# Patient Record
Sex: Female | Born: 1965 | Race: Black or African American | Hispanic: No | Smoking: Never smoker
Health system: Southern US, Community
[De-identification: ages and names within clinical notes are randomized; demographics above are authoritative.]

## PROBLEM LIST (undated history)

## (undated) DIAGNOSIS — R010 Benign and innocent cardiac murmurs: Secondary | ICD-10-CM

## (undated) DIAGNOSIS — J302 Other seasonal allergic rhinitis: Secondary | ICD-10-CM

## (undated) DIAGNOSIS — T8859XA Other complications of anesthesia, initial encounter: Secondary | ICD-10-CM

## (undated) DIAGNOSIS — J45909 Unspecified asthma, uncomplicated: Secondary | ICD-10-CM

## (undated) DIAGNOSIS — T4145XA Adverse effect of unspecified anesthetic, initial encounter: Secondary | ICD-10-CM

## (undated) DIAGNOSIS — Z8673 Personal history of transient ischemic attack (TIA), and cerebral infarction without residual deficits: Secondary | ICD-10-CM

## (undated) DIAGNOSIS — S83249A Other tear of medial meniscus, current injury, unspecified knee, initial encounter: Secondary | ICD-10-CM

## (undated) HISTORY — PX: ABDOMINAL HYSTERECTOMY: SHX81

## (undated) HISTORY — PX: TUBAL LIGATION: SHX77

---

## 1992-08-27 HISTORY — PX: KNEE ARTHROSCOPY: SHX127

## 2002-08-27 DIAGNOSIS — Z8673 Personal history of transient ischemic attack (TIA), and cerebral infarction without residual deficits: Secondary | ICD-10-CM

## 2002-08-27 HISTORY — DX: Personal history of transient ischemic attack (TIA), and cerebral infarction without residual deficits: Z86.73

## 2006-06-05 ENCOUNTER — Encounter: Admission: RE | Admit: 2006-06-05 | Discharge: 2006-06-05 | Payer: Self-pay | Admitting: Internal Medicine

## 2007-06-10 ENCOUNTER — Encounter: Admission: RE | Admit: 2007-06-10 | Discharge: 2007-06-10 | Payer: Self-pay | Admitting: Internal Medicine

## 2007-06-27 ENCOUNTER — Emergency Department (HOSPITAL_COMMUNITY): Admission: EM | Admit: 2007-06-27 | Discharge: 2007-06-27 | Payer: Self-pay | Admitting: Emergency Medicine

## 2007-09-29 ENCOUNTER — Inpatient Hospital Stay (HOSPITAL_COMMUNITY): Admission: RE | Admit: 2007-09-29 | Discharge: 2007-10-01 | Payer: Self-pay | Admitting: Obstetrics and Gynecology

## 2007-09-29 ENCOUNTER — Encounter (INDEPENDENT_AMBULATORY_CARE_PROVIDER_SITE_OTHER): Payer: Self-pay | Admitting: Obstetrics and Gynecology

## 2007-09-29 HISTORY — PX: LAPAROSCOPIC HYSTERECTOMY: SHX1926

## 2007-09-29 HISTORY — PX: CYSTOSCOPY: SHX5120

## 2008-06-10 ENCOUNTER — Encounter: Admission: RE | Admit: 2008-06-10 | Discharge: 2008-06-10 | Payer: Self-pay | Admitting: Internal Medicine

## 2009-06-15 ENCOUNTER — Encounter: Admission: RE | Admit: 2009-06-15 | Discharge: 2009-06-15 | Payer: Self-pay | Admitting: Internal Medicine

## 2010-08-08 ENCOUNTER — Emergency Department (HOSPITAL_COMMUNITY)
Admission: EM | Admit: 2010-08-08 | Discharge: 2010-08-08 | Payer: Self-pay | Source: Home / Self Care | Admitting: Family Medicine

## 2010-10-01 ENCOUNTER — Inpatient Hospital Stay (INDEPENDENT_AMBULATORY_CARE_PROVIDER_SITE_OTHER)
Admission: RE | Admit: 2010-10-01 | Discharge: 2010-10-01 | Disposition: A | Discharge status: Home / Self Care | Payer: Self-pay | Source: Ambulatory Visit | Attending: Family Medicine | Admitting: Family Medicine

## 2010-10-01 DIAGNOSIS — T148 Other injury of unspecified body region: Secondary | ICD-10-CM

## 2010-10-01 DIAGNOSIS — W57XXXA Bitten or stung by nonvenomous insect and other nonvenomous arthropods, initial encounter: Secondary | ICD-10-CM

## 2011-01-09 NOTE — Op Note (Signed)
NAMELASTACIA, SOLUM              ACCOUNT NO.:  [id number]   MEDICAL RECORD NO.:  [id number]          PATIENT TYPE:  OIB   LOCATION:  9320                          FACILITY:  [facility]   PHYSICIAN:  Naima A. Dillard, M.D. DATE OF BIRTH:  [birth year]   DATE OF PROCEDURE:  09/29/2007  DATE OF DISCHARGE:                               OPERATIVE REPORT   PREOPERATIVE DIAGNOSES:  1. Dysmenorrhea.  2. Menometrorrhagia.  3. Uterine fibroids.   POSTOPERATIVE DIAGNOSES:  1. Dysmenorrhea.  2. Menometrorrhagia.  3. Uterine fibroids.   OPERATION/PROCEDURE:  Total laparoscopic hysterectomy and cystoscopy.   SURGEON:  Naima A. Normand Sloop, M.D.   ASSISTANT:  Osborn Coho, M.D.   ANESTHESIA:  General.   FINDINGS:  Fibroid uterus with left sidewall adhesions.   SPECIMEN:  Uterus and fibroid and disposition to pathology.   ESTIMATED BLOOD LOSS:  Minimal.   COMPLICATIONS:  None.   DISPOSITION:  The patient to recovery room in stable condition.   DESCRIPTION OF PROCEDURE:  The patient was taken to the operating room  where she was placed in dorsal lithotomy position, prepped and draped in  a normal sterile fashion.  A weighted speculum placed in the posterior  portion of the vagina.  A deaver was used to elevate the vaginal mucosa  anteriorly.  The cervix was grasped with single-tooth tenaculum.  Uterus  did sound to 10 cm.  The uterus was then dilated up with Shawnie Pons dilators  up to 23.  A size 8 uterine manipulator was placed into the uterine  cavity with the RUMI without difficulty.  The occluder baloon and  catheter balloon  was then blown up and attention was then turned to the  umbilicus where a 10 mm infraumbilical incision was made with a scalpel  along her previous scar and carried down to the fascia.  The fascia was  incised and entered sharply, extended bilaterally.  The fascia was then  circumscribed with O Vicryl and a Hasson manipulator was placed into the  abdominal cavity and  intra-abdominal placement was confirmed with a lap.  The scope abdomen was insufflated with CO2 gas.  A 10 mm trocar was  placed in the left lower quadrant just outside of the inferior  epigastric artery.  The 5 mm trocars placed on the right lower quadrant  side of the right epigastric artery.  The patient's right utero-ovarian  ligament was cauterized and cut using Harmonic scalpel and the bladder  flap was created sharply with a Harmonic and digitally to the midline.  The patient left side wall adhesions which were taken down with the  Harmonic scalpel and then her left utero-ovarian ligament were  cauterized, cut, and then the left round ligament was cauterized, cut  and the bladder flap was made to the midline.  The bladder flap was then  seen, but it was then removed bluntly with Harmonic and atraumatic  grasper.  The findings upon  looking in the abdomen was normal abdominal  anatomy.  The patient had a large fibroid uterus, about 10 weeks size,  with several small exophytic fibroids and one large  posterior fibroid.  The patient had normal appearing ovary and ligated tube on the right. On  the left, there was no absent tube and scar tissue extending from the  uterus to the pelvic sidewall where the ovary used to be.  After the  bladder flap was created, the uterine artery was clamped, cut and  ligated with a Harmonic scalpel on both sides. The anterior and  posterior cul-de-sac were circumscribed using the Harmonic scalpel  around the RUMI and the uterus was too big to pull out of the vagina, so  we did do a myomectomy and morcellate the uterus using Harmonic scalpel.  Uterus was then pulled out of the vagina and the fibroid also pulled out  of the vagina.  The vaginal cuff was reapproximated using interrupted  suture of 0 PDS first on the cuff, the of the two angles, and then three  sutures in the midline.  Irrigation was done and the gas was allowed to  leave the abdomen. There  was some bleeding along the left ovary which  was made hemostatic with Kleppingers.  Irrigation was done again and the  pressure was let out and was noted to be hemostatic.  Attention was then  turned to the vagina where the Foley catheter was removed.  The patient  was given indigo carmine and we looked at the ureters which both  effluxed indigo carmine without difficulty.  Bladder had been was intact  and normal.  No suture was placed in the bladder.  The cystoscope was  removed from the bladder. I then put a speculum in the vagina.  There  was no bleeding from the angles. We then changed our gloves and went up  top.  Removed all  all instruments under direct visualization.  First we  filled the abdomen back with CO2 gas.  There was a small bleeding area  just underneath the bladder flap.  This was made hemostatic with  Kleppingers and Gelfoam.  All instruments were then removed under direct  visualization of the camera.  The umbilical incision was reapproximated,  tying the 0 Vicryl circumferential stitch.  The skin and the umbilical  stitch was closed using 3-0 Monocryl subcuticular fashion. A left lower  quadrant subcutaneous tissue and fascia we could feel were  reapproximated using O Vicryl and the two skin incisions on the left  lower quadrant and right lower quadrant were closed using Dermabond.  Foley catheter was replaced.  Sponge, lap and needle counts were  correct.  The patient to the recovery room in stable condition.      Naima A. Normand Sloop, M.D.  Electronically Signed     NAD/MEDQ  D:  09/29/2007  T:  09/30/2007  Job:  [id number]

## 2011-01-09 NOTE — H&P (Signed)
NAMETYMESHA, DITMORE              ACCOUNT NO.:  [id number]   MEDICAL RECORD NO.:  [id number]          PATIENT TYPE:  AMB   LOCATION:  SDC                           FACILITY:  [facility]   PHYSICIAN:  Naima A. Dillard, M.D. DATE OF BIRTH:  Jul 30, 1966   DATE OF ADMISSION:  DATE OF DISCHARGE:                              HISTORY & PHYSICAL   CHIEF COMPLAINTS:  1. Menometrorrhagia.  2. Dysmenorrhea.  3. Stress urinary incontinence with pelvic relaxation.  4. Symptomatic fibroids.   HISTORY AND PHYSICAL:  The patient is a 45 year old gravida 2, para 1  who presented to me in November 2008, stating that she was having  occasional spotting and vaginal bleeding in between her periods.  She  denies having any bleeding disorders, was having some urinary frequency  and incontinence.  The patient then later on in December/January had  very heavy painful periods and actually went to the emergency room for  the pain.  She had an ultrasound which showed her uterus to be 9.2 x 4.9  x 5.8-cm with multiple fibroids, the largest measuring 4.1-cm.  Ovaries  were normal.  The patient came to me.  Her TSH was normal.  She denied  being on any new medications.  Ultrasound as above.  Her hemoglobin was  11.8.  Her endometrial biopsy was benign.  She underwent urodynamics  which were significant for stress urinary incontinence and the patient  was explained all the treatment for the above.  She desires to proceed  with hysterectomy.   PAST SURGICAL HISTORY:  1. Bilateral tubal ligation.  2. Right knee surgery.  3. Laparoscopic salpingectomy secondary to an ectopic.   PAST MEDICAL HISTORY:  As above and questionable cerebrovascular  accident in 2003 cause is unknown.  The patient says one doctor said she  did not have it, it was just migraines.  I tried to get her release of  information but is unavailable.   PAST OBSTETRICAL ISSUES:  Significant for vaginal delivery x1 and  ectopic pregnancy x1.   MEDICATIONS:  Zyrtec and Motrin as needed.   FAMILY HISTORY:  Significant for maternal grandmother with ovarian  cancer, hypertension, and diabetes.   The patient denies any smoking.  She has occasional alcohol use and no  illicit drug use.   ALLERGIES:  No known drug allergies.   PHYSICAL EXAMINATION:  VITAL SIGNS:  The patient's blood pressure is  108/50, weight is 142 pounds.  She is 5 feet 8 inches.  HEENT:  Pupils are equal.  Hearing is normal.  Throat is clear.  Thyroid  is not enlarged.  HEART:  Regular rate and rhythm.  LUNGS:  Clear to auscultation bilaterally.  ABDOMEN:  Soft and nontender.  There is no organomegaly.  BACK:  Has no CVA tenderness bilaterally.  EXTREMITIES:  No cyanosis, clubbing, or edema.  NEUROLOGIC:  Within normal limits.  PELVIC:  Full vaginal exam is within normal limits.  Cervix is nontender  without any lesions.  The uterus is about 8 weeks' size, mobile, and  nontender. Adnexa have no masses.   ASSESSMENT:  1. Symptomatic fibroids.  2. Menometrorrhagia.  3. Dysmenorrhea.  4. Stress urinary incontinence.   All treatments were given to the patient for menometrorrhagia, fibroids,  stress urinary incontinence, and pelvic relaxation.  These are, but not  limited to, observation, pessary or medication, D&C hysteroscopy, D&C  hysteroscopy ablation, myomectomy, and hysterectomy.  All were reviewed  with the patient.  The patient has decided to proceed with total  laparoscopic hysterectomy.  However, she wishes to do Kegel exercises  for her stress urinary incontinence.  First she stated she wanted to  have an anterior posterior repair mesh but after doing some research  about slings, the anterior posterior repair she wants to hold on this  and just do Kegel exercises.   PLAN:  Proceed with total laparoscopic hysterectomy.  She understands  the risks are, but not limited to, bleeding, infection, damage to  internal organs such as bowel, bladder,  and major blood vessels.  The  patient was given a brochure on total laparoscopic hysterectomy and an  ACOG brochure on hysterectomy.  She was also given some information on  how to do Kegel exercises.      Naima A. Normand Sloop, M.D.  Electronically Signed     NAD/MEDQ  D:  09/28/2007  T:  09/28/2007  Job:  [id number]

## 2011-01-09 NOTE — Discharge Summary (Signed)
[name], [name]              ACCOUNT NO.:  [id number]   MEDICAL RECORD NO.:  [id number]          PATIENT TYPE:  INP   LOCATION:  9320                          FACILITY:  [facility]   PHYSICIAN:  [name], M.D. DATE OF BIRTH:  1966-08-17   DATE OF ADMISSION:  09/29/2007  DATE OF DISCHARGE:  10/01/2007                               DISCHARGE SUMMARY   DISPOSITION:  The patient is to go home.   DIET:  No restrictions.   ACTIVITY:  No driving for 2 weeks. No heavy lifting for 6 weeks. No  sexual activity for 6 weeks.   DISCHARGE MEDICATIONS:  Include Motrin, Vicodin, Reglan, Phenergan,  Ciprofloxacin, Flagyl, and Colace.   FOLLOWUP:  The patient is to followup with me, [name], on October 10, 2007 at 10:00 a.m. She also is given written postoperative  instructions from my office.   CONDITION ON DISCHARGE:  Stable.   HOSPITAL COURSE:  The patient underwent a total laparoscopic  hysterectomy with lysis on adhesions on September 29, 2007 without  difficulty. On postoperative day 1, the patient did have a temperature  as high as 101 and some nausea the first day. She was cut back to a  clear liquid diet, placed on Reglan. A UA and C&S was done and she was  placed on Ciprofloxacin and Flagyl. On postoperative day 2, her white  count actually decreased to 6.3 with no left shift. Hemoglobin 8.9 and  platelets of 289,000.   PHYSICAL EXAMINATION:  GENERAL:  Her examination is totally benign.  HEART:  Regular.  LUNGS:  Clear.  ABDOMEN:  Soft, nontender.  GENITOURINARY:  She has no bleeding from the vagina.   The patient is postoperative day 2, doing well, passing gas and  tolerating p.o. She is ready to go home. Because of her elevated  temperature, I will send her home on antibiotic Ciprofloxacin and  Flagyl. The patient is told to call if she gets a temperature over 100.  She is also discharged home with medications as above.      [name], M.D.  Electronically Signed     NAD/MEDQ  D:  10/01/2007  T:  10/02/2007  Job:  [id number]

## 2011-05-17 LAB — CBC
HCT: 33.2 — ABNORMAL LOW
Hemoglobin: 11.3 — ABNORMAL LOW
MCHC: 34.1
MCV: 87.3
Platelets: 361
RBC: 3.8 — ABNORMAL LOW
RDW: 13.9
WBC: 5.1

## 2011-05-17 LAB — BASIC METABOLIC PANEL
BUN: 9
CO2: 25
Calcium: 9.2
Chloride: 105
Creatinine, Ser: 0.74
GFR calc Af Amer: >60
GFR calc non Af Amer: >60
Glucose, Bld: 85
Potassium: 4
Sodium: 137

## 2011-05-17 LAB — HCG, QUANTITATIVE, PREGNANCY: hCG, Beta Chain, Quant, S: <2

## 2011-05-18 LAB — CBC
HCT: 25.9 — ABNORMAL LOW
HCT: 30.6 — ABNORMAL LOW
Hemoglobin: 10.4 — ABNORMAL LOW
Hemoglobin: 8.9 — ABNORMAL LOW
MCHC: 33.9
MCHC: 34.3
MCV: 87.3
MCV: 88.1
Platelets: 289
Platelets: 345
RBC: 2.97 — ABNORMAL LOW
RBC: 3.47 — ABNORMAL LOW
RDW: 13.9
RDW: 14
WBC: 6.3
WBC: 9.1

## 2011-05-18 LAB — URINALYSIS, ROUTINE W REFLEX MICROSCOPIC
Bilirubin Urine: NEGATIVE
Glucose, UA: NEGATIVE
Ketones, ur: NEGATIVE
Nitrite: NEGATIVE
Protein, ur: NEGATIVE
Specific Gravity, Urine: <1.005 — ABNORMAL LOW
Urobilinogen, UA: 0.2
pH: 7

## 2011-05-18 LAB — URINE MICROSCOPIC-ADD ON

## 2011-05-18 LAB — URINE CULTURE
Colony Count: NO GROWTH
Culture: NO GROWTH
Special Requests: NEGATIVE

## 2011-05-18 LAB — BASIC METABOLIC PANEL
BUN: 3 — ABNORMAL LOW
CO2: 27
Calcium: 8.2 — ABNORMAL LOW
Chloride: 105
Creatinine, Ser: 0.73
GFR calc Af Amer: >60
GFR calc non Af Amer: >60
Glucose, Bld: 125 — ABNORMAL HIGH
Potassium: 4.2
Sodium: 137

## 2011-05-18 LAB — DIFFERENTIAL
Basophils Absolute: 0
Basophils Relative: 0
Eosinophils Absolute: 0.1
Eosinophils Relative: 1
Lymphocytes Relative: 25
Lymphs Abs: 1.6
Monocytes Absolute: 0.5
Monocytes Relative: 7
Neutro Abs: 4.2
Neutrophils Relative %: 66

## 2011-06-06 LAB — DIFFERENTIAL
Basophils Absolute: 0
Basophils Relative: 0
Eosinophils Absolute: 0
Eosinophils Relative: 1
Lymphocytes Relative: 22
Lymphs Abs: 1.4
Monocytes Absolute: 0.3
Monocytes Relative: 5
Neutro Abs: 4.6
Neutrophils Relative %: 72

## 2011-06-06 LAB — URINE MICROSCOPIC-ADD ON

## 2011-06-06 LAB — COMPREHENSIVE METABOLIC PANEL
ALT: 15
AST: 17
Albumin: 3.5
Alkaline Phosphatase: 54
BUN: 6
CO2: 27
Calcium: 9
Chloride: 106
Creatinine, Ser: 0.77
GFR calc Af Amer: >60
GFR calc non Af Amer: >60
Glucose, Bld: 95
Potassium: 3.6
Sodium: 139
Total Bilirubin: 0.6
Total Protein: 7

## 2011-06-06 LAB — CBC
HCT: 31.2 — ABNORMAL LOW
Hemoglobin: 10.5 — ABNORMAL LOW
MCHC: 33.6
MCV: 88.4
Platelets: 388
RBC: 3.52 — ABNORMAL LOW
RDW: 13.6
WBC: 6.4

## 2011-06-06 LAB — GC/CHLAMYDIA PROBE AMP, GENITAL
Chlamydia, DNA Probe: NEGATIVE
GC Probe Amp, Genital: NEGATIVE

## 2011-06-06 LAB — URINALYSIS, ROUTINE W REFLEX MICROSCOPIC
Bilirubin Urine: NEGATIVE
Glucose, UA: NEGATIVE
Ketones, ur: NEGATIVE
Leukocytes, UA: NEGATIVE
Nitrite: NEGATIVE
Protein, ur: NEGATIVE
Specific Gravity, Urine: 1.021
Urobilinogen, UA: 0.2
pH: 6.5

## 2011-06-06 LAB — WET PREP, GENITAL
Clue Cells Wet Prep HPF POC: NONE SEEN
Trich, Wet Prep: NONE SEEN
WBC, Wet Prep HPF POC: NONE SEEN
Yeast Wet Prep HPF POC: NONE SEEN

## 2011-06-06 LAB — PREGNANCY, URINE: Preg Test, Ur: NEGATIVE

## 2011-06-06 LAB — RPR: RPR Ser Ql: NONREACTIVE

## 2012-02-19 ENCOUNTER — Encounter (HOSPITAL_COMMUNITY): Payer: Self-pay | Admitting: Emergency Medicine

## 2012-02-19 ENCOUNTER — Emergency Department (INDEPENDENT_AMBULATORY_CARE_PROVIDER_SITE_OTHER): Payer: Self-pay

## 2012-02-19 ENCOUNTER — Emergency Department (INDEPENDENT_AMBULATORY_CARE_PROVIDER_SITE_OTHER)
Admission: EM | Admit: 2012-02-19 | Discharge: 2012-02-19 | Disposition: A | Discharge status: Home / Self Care | Payer: Self-pay | Source: Home / Self Care | Attending: Emergency Medicine | Admitting: Emergency Medicine

## 2012-02-19 DIAGNOSIS — S66819A Strain of other specified muscles, fascia and tendons at wrist and hand level, unspecified hand, initial encounter: Secondary | ICD-10-CM

## 2012-02-19 DIAGNOSIS — S63509A Unspecified sprain of unspecified wrist, initial encounter: Secondary | ICD-10-CM

## 2012-02-19 DIAGNOSIS — S63599A Other specified sprain of unspecified wrist, initial encounter: Secondary | ICD-10-CM

## 2012-02-19 HISTORY — DX: Other seasonal allergic rhinitis: J30.2

## 2012-02-19 MED ORDER — MELOXICAM 7.5 MG PO TABS: 7.5000 mg | ORAL_TABLET | Freq: Every day | ORAL | Status: AC

## 2012-02-19 NOTE — ED Notes (Signed)
Patient not in treatment room, patient in xray

## 2012-02-19 NOTE — ED Provider Notes (Signed)
History     CSN: [id number]  Arrival date & time 02/19/12  1419   First MD Initiated Contact with Patient 02/19/12 1503      Chief Complaint  Patient presents with  . Wrist Pain    (Consider location/radiation/quality/duration/timing/severity/associated sxs/prior treatment) HPI Comments: Patient presents urgent care today complaining of left wrist pain she fell down 2 steps, landing in buttocks and her  left  wrist trying to avoid  fall. Is able to move her fingers but is tender mostly in the ulnar side of her wrist. Patient denies any numbness or weakness.  Patient is a 46 y.o. female presenting with wrist pain. The history is provided by the patient.  Wrist Pain This is a new problem. The current episode started 2 days ago. The problem occurs constantly. The problem has not changed since onset.Pertinent negatives include no chest pain, no abdominal pain, no headaches and no shortness of breath. The symptoms are aggravated by bending. The symptoms are relieved by rest. She has tried nothing for the symptoms. The treatment provided no relief.    Past Medical History  Diagnosis Date  . Seasonal allergies     Past Surgical History  Procedure Date  . Abdominal hysterectomy     History reviewed. No pertinent family history.  History  Substance Use Topics  . Smoking status: Never Smoker   . Smokeless tobacco: Not on file  . Alcohol Use: Yes    OB History    Grav Para Term Preterm Abortions TAB SAB Ect Mult Living                  Review of Systems  Constitutional: Negative for fever, chills, diaphoresis, activity change, appetite change and fatigue.  Respiratory: Negative for shortness of breath.   Cardiovascular: Negative for chest pain.  Gastrointestinal: Negative for abdominal pain.  Musculoskeletal: Positive for arthralgias. Negative for joint swelling.  Skin: Negative for rash and wound.  Neurological: Negative for weakness, numbness and headaches.     Allergies  Review of patient's allergies indicates no known allergies.  Home Medications   Current Outpatient Rx  Name Route Sig Dispense Refill  . CETIRIZINE HCL 1 MG/ML PO SYRP Oral Take by mouth daily.    Marland Kitchen VITAMIN D PO Oral Take by mouth.    . MULTIVITAMIN PO Oral Take by mouth.    . TRIAMCINOLONE ACETONIDE 55 MCG/ACT NA INHA Nasal Place 2 sprays into the nose daily.    . MELOXICAM 7.5 MG PO TABS Oral Take 1 tablet (7.5 mg total) by mouth daily. 14 tablet 0    BP 122/76  Pulse 80  Temp 98.2 F (36.8 C) (Oral)  Resp 18  SpO2 100%  Physical Exam  Nursing note and vitals reviewed. Constitutional: She appears well-developed and well-nourished. No distress.  Musculoskeletal: She exhibits tenderness. She exhibits no edema.       Right shoulder: She exhibits decreased range of motion, tenderness, bony tenderness and pain. She exhibits no swelling, no effusion, no spasm and normal strength.       Left hand: She exhibits tenderness and bony tenderness. She exhibits normal range of motion, normal two-point discrimination, normal capillary refill, no deformity, no laceration and no swelling. decreased sensation noted. Decreased sensation is present in the ulnar distribution. Decreased sensation is not present in the medial redistribution and is not present in the radial distribution. Normal strength noted.       Hands: Neurological: She exhibits normal muscle tone.  Skin: No  rash noted. No erythema.    ED Course  Procedures (including critical care time)  Labs Reviewed - No data to display Dg Wrist Complete Left  02/19/2012  *RADIOLOGY REPORT*  Clinical Data: Fall  LEFT WRIST - COMPLETE 3+ VIEW  Comparison: None.  Findings: Four views of the left wrist submitted.  No acute fracture or subluxation.  No radiopaque foreign body.  IMPRESSION: No acute fracture or subluxation.  Original Report Authenticated By: Natasha Mead, M.D.     1. Wrist sprain and strain       MDM  Left  wrist contusion and sprain. Negative x-rays. No neural muscular or vascular deficits. Patient was encouraged to followup with an orthopedic Dr. if pain persists was provided with a Velcro splint to be used for comfort for 3-5 days.        Jimmie Molly, MD 02/19/12 2013

## 2012-02-19 NOTE — Discharge Instructions (Signed)
If pain persists beyond 7-10 days followup with an orthopedic Dr. Has discuss use provided splint for comfort only.

## 2012-02-19 NOTE — ED Notes (Signed)
Slipped on stairs, fell down 2 steps, landing buttocks.  Left wrist pain noticed this am.  Fall on steps occurred last night left radial pulse 2 plus, able to move fingers, but feels significant pain and soreness in left hand and wrist with movement.  Brisk cap refil

## 2012-10-14 ENCOUNTER — Other Ambulatory Visit: Payer: Self-pay | Admitting: Internal Medicine

## 2012-10-14 DIAGNOSIS — Z1231 Encounter for screening mammogram for malignant neoplasm of breast: Secondary | ICD-10-CM

## 2012-10-21 ENCOUNTER — Ambulatory Visit
Admission: RE | Admit: 2012-10-21 | Discharge: 2012-10-21 | Disposition: A | Discharge status: Home / Self Care | Payer: BC Managed Care – PPO | Source: Ambulatory Visit | Attending: Internal Medicine | Admitting: Internal Medicine

## 2012-10-21 DIAGNOSIS — Z1231 Encounter for screening mammogram for malignant neoplasm of breast: Secondary | ICD-10-CM

## 2013-10-01 ENCOUNTER — Other Ambulatory Visit: Payer: Self-pay

## 2013-10-01 DIAGNOSIS — Z1231 Encounter for screening mammogram for malignant neoplasm of breast: Secondary | ICD-10-CM

## 2013-10-27 ENCOUNTER — Ambulatory Visit: Payer: BC Managed Care – PPO

## 2013-10-29 ENCOUNTER — Encounter (INDEPENDENT_AMBULATORY_CARE_PROVIDER_SITE_OTHER): Payer: BC Managed Care – PPO | Admitting: Ophthalmology

## 2013-10-29 DIAGNOSIS — H33309 Unspecified retinal break, unspecified eye: Secondary | ICD-10-CM

## 2013-10-29 DIAGNOSIS — H43819 Vitreous degeneration, unspecified eye: Secondary | ICD-10-CM

## 2013-10-29 DIAGNOSIS — H35419 Lattice degeneration of retina, unspecified eye: Secondary | ICD-10-CM

## 2013-11-02 ENCOUNTER — Ambulatory Visit
Admission: RE | Admit: 2013-11-02 | Discharge: 2013-11-02 | Disposition: A | Discharge status: Home / Self Care | Payer: Self-pay | Source: Ambulatory Visit

## 2013-11-02 DIAGNOSIS — Z1231 Encounter for screening mammogram for malignant neoplasm of breast: Secondary | ICD-10-CM

## 2013-11-05 ENCOUNTER — Encounter (INDEPENDENT_AMBULATORY_CARE_PROVIDER_SITE_OTHER): Payer: Self-pay | Admitting: Ophthalmology

## 2013-11-09 ENCOUNTER — Encounter (INDEPENDENT_AMBULATORY_CARE_PROVIDER_SITE_OTHER): Payer: BC Managed Care – PPO | Admitting: Ophthalmology

## 2013-11-09 DIAGNOSIS — H33309 Unspecified retinal break, unspecified eye: Secondary | ICD-10-CM

## 2013-11-12 ENCOUNTER — Ambulatory Visit: Payer: BC Managed Care – PPO

## 2013-11-16 ENCOUNTER — Ambulatory Visit (INDEPENDENT_AMBULATORY_CARE_PROVIDER_SITE_OTHER): Payer: BC Managed Care – PPO | Admitting: Ophthalmology

## 2013-11-16 DIAGNOSIS — H33309 Unspecified retinal break, unspecified eye: Secondary | ICD-10-CM

## 2014-04-08 ENCOUNTER — Ambulatory Visit (INDEPENDENT_AMBULATORY_CARE_PROVIDER_SITE_OTHER): Payer: BC Managed Care – PPO | Admitting: Ophthalmology

## 2014-04-08 DIAGNOSIS — H5210 Myopia, unspecified eye: Secondary | ICD-10-CM

## 2014-04-08 DIAGNOSIS — H33309 Unspecified retinal break, unspecified eye: Secondary | ICD-10-CM

## 2014-04-08 DIAGNOSIS — H43819 Vitreous degeneration, unspecified eye: Secondary | ICD-10-CM

## 2014-09-27 ENCOUNTER — Other Ambulatory Visit: Payer: Self-pay

## 2014-09-27 DIAGNOSIS — Z1231 Encounter for screening mammogram for malignant neoplasm of breast: Secondary | ICD-10-CM

## 2014-10-07 ENCOUNTER — Ambulatory Visit (INDEPENDENT_AMBULATORY_CARE_PROVIDER_SITE_OTHER): Payer: BC Managed Care – PPO | Admitting: Podiatry

## 2014-10-07 ENCOUNTER — Ambulatory Visit (INDEPENDENT_AMBULATORY_CARE_PROVIDER_SITE_OTHER): Payer: BC Managed Care – PPO

## 2014-10-07 ENCOUNTER — Encounter: Payer: Self-pay | Admitting: Podiatry

## 2014-10-07 VITALS — BP 113/67 | HR 85 | Resp 16

## 2014-10-07 DIAGNOSIS — M722 Plantar fascial fibromatosis: Secondary | ICD-10-CM

## 2014-10-07 MED ORDER — MELOXICAM 15 MG PO TABS: 15.0000 mg | ORAL_TABLET | Freq: Every day | ORAL | Status: DC

## 2014-10-07 MED ORDER — METHYLPREDNISOLONE (PAK) 4 MG PO TABS: ORAL_TABLET | ORAL | Status: DC

## 2014-10-07 NOTE — Progress Notes (Signed)
   Subjective:    Patient ID: [name], female    DOB: [birth year], 49 y.o.   MRN: [id number]  HPI Comments: "I have been having a lot of pain"  Patient c/o aching plantar heels bilateral for about 1 year. Pain is starting to run into arch. She does have AM pain. 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 She has tried brace, new shoes, OTC insoles and meds. No help.     Review of Systems  Musculoskeletal: Positive for back pain and arthralgias.  Allergic/Immunologic: Positive for environmental allergies and food allergies.  All other systems reviewed and are negative.      Objective:   Physical Exam: I have reviewed her past medical history medications allergy surgery social history and review of systems. Pulses are strongly palpable bilaterally. Neurologic sensorium is intact per Semmes-Weinstein monofilament. Deep tendon reflexes intact bilateral muscle strength +5 over 5 dorsiflexion plantar flexors and inverters everters all intrinsic musculature is intact. Orthopedic evaluation demonstrates mild pes planus bilateral pain on palpation medial calcaneal tubercles bilateral left greater than right radiographic evaluation confirm soft tissue increase in density at the plantar fascial calcaneal insertion sites bilateral.  Assessment: Plantar fasciitis bilateral. Mild pes planus.  Plan: We discussed the etiology pathology conservative versus surgical therapies. Started her on a Medrol Dosepak to be followed by meloxicam. Placed her in bilateral plantar fascial splints and a night splint was dispensed. Discussed appropriate shoe gear stretching exercises ice therapy shoe gear modifications and injected the bilateral heels today with Kenalog and local anesthetic. Follow-up with her in 1 month        Assessment & Plan:

## 2014-10-07 NOTE — Patient Instructions (Signed)

## 2014-10-08 ENCOUNTER — Telehealth: Payer: Self-pay | Admitting: *Deleted

## 2014-10-08 DIAGNOSIS — M722 Plantar fascial fibromatosis: Secondary | ICD-10-CM

## 2014-10-08 NOTE — Telephone Encounter (Signed)
Pt  state seen in office 10/07/2014, and Dr. Al CorpusHyatt states she could pick up a additional blue night splint, if she found the one helped the current worse foot.  I told pt I would have one available for her at the checkout desk, in her size 9.

## 2014-10-21 ENCOUNTER — Encounter (INDEPENDENT_AMBULATORY_CARE_PROVIDER_SITE_OTHER): Payer: BC Managed Care – PPO | Admitting: Ophthalmology

## 2014-10-21 DIAGNOSIS — H43813 Vitreous degeneration, bilateral: Secondary | ICD-10-CM

## 2014-10-21 DIAGNOSIS — H33303 Unspecified retinal break, bilateral: Secondary | ICD-10-CM

## 2014-10-21 DIAGNOSIS — H3561 Retinal hemorrhage, right eye: Secondary | ICD-10-CM

## 2014-10-21 DIAGNOSIS — H2513 Age-related nuclear cataract, bilateral: Secondary | ICD-10-CM

## 2014-11-02 ENCOUNTER — Ambulatory Visit: Payer: BC Managed Care – PPO | Admitting: Podiatry

## 2014-11-04 ENCOUNTER — Ambulatory Visit
Admission: RE | Admit: 2014-11-04 | Discharge: 2014-11-04 | Disposition: A | Discharge status: Home / Self Care | Payer: BC Managed Care – PPO | Source: Ambulatory Visit

## 2014-11-04 DIAGNOSIS — Z1231 Encounter for screening mammogram for malignant neoplasm of breast: Secondary | ICD-10-CM

## 2014-11-09 ENCOUNTER — Ambulatory Visit (INDEPENDENT_AMBULATORY_CARE_PROVIDER_SITE_OTHER): Payer: BC Managed Care – PPO | Admitting: Podiatry

## 2014-11-09 ENCOUNTER — Encounter: Payer: Self-pay | Admitting: Podiatry

## 2014-11-09 DIAGNOSIS — M722 Plantar fascial fibromatosis: Secondary | ICD-10-CM | POA: Diagnosis not present

## 2014-11-09 NOTE — Patient Instructions (Signed)

## 2014-11-09 NOTE — Progress Notes (Signed)
She presents today for follow-up of her plantar fasciitis. She states that her orthotics haven't come in and she is eager to try those out. She states that she is considerably better however she no she overdid it last weekend while at a track meet.  Objective: Vital signs are stable she is alert and oriented 3 no change in physical findings.  Assessment: Plantar fasciitis bilateral.  Plan: Continue all conservative therapies including oral medication and now orthotics. Follow up with her in 1 month if necessary.

## 2014-12-02 ENCOUNTER — Encounter (INDEPENDENT_AMBULATORY_CARE_PROVIDER_SITE_OTHER): Payer: BC Managed Care – PPO | Admitting: Ophthalmology

## 2014-12-02 DIAGNOSIS — H3561 Retinal hemorrhage, right eye: Secondary | ICD-10-CM

## 2014-12-02 DIAGNOSIS — H33303 Unspecified retinal break, bilateral: Secondary | ICD-10-CM | POA: Diagnosis not present

## 2014-12-02 DIAGNOSIS — H43813 Vitreous degeneration, bilateral: Secondary | ICD-10-CM

## 2014-12-28 ENCOUNTER — Ambulatory Visit: Payer: BC Managed Care – PPO | Admitting: Podiatry

## 2014-12-28 ENCOUNTER — Other Ambulatory Visit: Payer: Self-pay | Admitting: Podiatry

## 2015-01-13 ENCOUNTER — Ambulatory Visit: Payer: BC Managed Care – PPO | Admitting: Podiatry

## 2015-06-06 ENCOUNTER — Ambulatory Visit (INDEPENDENT_AMBULATORY_CARE_PROVIDER_SITE_OTHER): Payer: BC Managed Care – PPO | Admitting: Ophthalmology

## 2015-06-13 ENCOUNTER — Ambulatory Visit (INDEPENDENT_AMBULATORY_CARE_PROVIDER_SITE_OTHER): Payer: BC Managed Care – PPO | Admitting: Ophthalmology

## 2015-06-13 DIAGNOSIS — H33303 Unspecified retinal break, bilateral: Secondary | ICD-10-CM

## 2015-06-13 DIAGNOSIS — H43813 Vitreous degeneration, bilateral: Secondary | ICD-10-CM | POA: Diagnosis not present

## 2015-09-29 ENCOUNTER — Telehealth: Payer: Self-pay | Admitting: *Deleted

## 2015-09-29 ENCOUNTER — Other Ambulatory Visit: Payer: Self-pay

## 2015-09-29 DIAGNOSIS — Z1231 Encounter for screening mammogram for malignant neoplasm of breast: Secondary | ICD-10-CM

## 2015-09-29 NOTE — Telephone Encounter (Signed)
Pt states she's been wearing two night splints for over a year, they're beginning to look worn and she would like replacements.   Informed pt she could purchase replacements the insurance may not cover, and she would be charged.

## 2015-10-03 DIAGNOSIS — M722 Plantar fascial fibromatosis: Secondary | ICD-10-CM

## 2015-11-07 ENCOUNTER — Ambulatory Visit
Admission: RE | Admit: 2015-11-07 | Discharge: 2015-11-07 | Disposition: A | Discharge status: Home / Self Care | Payer: BC Managed Care – PPO | Source: Ambulatory Visit

## 2015-11-07 DIAGNOSIS — Z1231 Encounter for screening mammogram for malignant neoplasm of breast: Secondary | ICD-10-CM

## 2015-11-28 ENCOUNTER — Encounter (HOSPITAL_COMMUNITY)
Admission: EM | Disposition: A | Discharge status: Home / Self Care | Payer: Self-pay | Source: Home / Self Care | Attending: Emergency Medicine

## 2015-11-28 ENCOUNTER — Observation Stay (HOSPITAL_COMMUNITY)
Admission: EM | Admit: 2015-11-28 | Discharge: 2015-11-29 | Disposition: A | Discharge status: Home / Self Care | Payer: BC Managed Care – PPO | Attending: Internal Medicine | Admitting: Internal Medicine

## 2015-11-28 ENCOUNTER — Emergency Department (HOSPITAL_COMMUNITY): Payer: BC Managed Care – PPO

## 2015-11-28 ENCOUNTER — Encounter (HOSPITAL_COMMUNITY): Payer: Self-pay | Admitting: Emergency Medicine

## 2015-11-28 DIAGNOSIS — R079 Chest pain, unspecified: Secondary | ICD-10-CM | POA: Diagnosis not present

## 2015-11-28 DIAGNOSIS — Z79899 Other long term (current) drug therapy: Secondary | ICD-10-CM | POA: Insufficient documentation

## 2015-11-28 DIAGNOSIS — F410 Panic disorder [episodic paroxysmal anxiety] without agoraphobia: Secondary | ICD-10-CM | POA: Diagnosis not present

## 2015-11-28 DIAGNOSIS — F419 Anxiety disorder, unspecified: Secondary | ICD-10-CM | POA: Insufficient documentation

## 2015-11-28 DIAGNOSIS — K219 Gastro-esophageal reflux disease without esophagitis: Secondary | ICD-10-CM | POA: Insufficient documentation

## 2015-11-28 DIAGNOSIS — R0789 Other chest pain: Secondary | ICD-10-CM | POA: Diagnosis not present

## 2015-11-28 DIAGNOSIS — F411 Generalized anxiety disorder: Secondary | ICD-10-CM | POA: Diagnosis present

## 2015-11-28 DIAGNOSIS — E785 Hyperlipidemia, unspecified: Secondary | ICD-10-CM | POA: Diagnosis not present

## 2015-11-28 DIAGNOSIS — R42 Dizziness and giddiness: Secondary | ICD-10-CM | POA: Insufficient documentation

## 2015-11-28 DIAGNOSIS — R9431 Abnormal electrocardiogram [ECG] [EKG]: Secondary | ICD-10-CM | POA: Diagnosis not present

## 2015-11-28 DIAGNOSIS — Z8673 Personal history of transient ischemic attack (TIA), and cerebral infarction without residual deficits: Secondary | ICD-10-CM | POA: Insufficient documentation

## 2015-11-28 DIAGNOSIS — R05 Cough: Secondary | ICD-10-CM | POA: Diagnosis not present

## 2015-11-28 DIAGNOSIS — R0602 Shortness of breath: Secondary | ICD-10-CM | POA: Diagnosis not present

## 2015-11-28 HISTORY — PX: CARDIAC CATHETERIZATION: SHX172

## 2015-11-28 LAB — CBC
HCT: 35.8 % — ABNORMAL LOW (ref 36.0–46.0)
Hemoglobin: 11.5 g/dL — ABNORMAL LOW (ref 12.0–15.0)
MCH: 27.8 pg (ref 26.0–34.0)
MCHC: 32.1 g/dL (ref 30.0–36.0)
MCV: 86.7 fL (ref 78.0–100.0)
Platelets: 377 10*3/uL (ref 150–400)
RBC: 4.13 MIL/uL (ref 3.87–5.11)
RDW: 14.2 % (ref 11.5–15.5)
WBC: 5.3 10*3/uL (ref 4.0–10.5)

## 2015-11-28 LAB — LIPID PANEL
Cholesterol: 187 mg/dL (ref 0–200)
HDL: 55 mg/dL (ref >40)
LDL Cholesterol: 110 mg/dL — ABNORMAL HIGH (ref 0–99)
Total CHOL/HDL Ratio: 3.4 RATIO
Triglycerides: 110 mg/dL (ref <150)
VLDL: 22 mg/dL (ref 0–40)

## 2015-11-28 LAB — D-DIMER, QUANTITATIVE: D-Dimer, Quant: 0.38 ug/mL-FEU (ref 0.00–0.50)

## 2015-11-28 LAB — BASIC METABOLIC PANEL
Anion gap: 9 (ref 5–15)
BUN: 13 mg/dL (ref 6–20)
CO2: 25 mmol/L (ref 22–32)
Calcium: 9.5 mg/dL (ref 8.9–10.3)
Chloride: 107 mmol/L (ref 101–111)
Creatinine, Ser: 0.97 mg/dL (ref 0.44–1.00)
GFR calc Af Amer: >60 mL/min (ref >60)
GFR calc non Af Amer: >60 mL/min (ref >60)
Glucose, Bld: 94 mg/dL (ref 65–99)
Potassium: 3.9 mmol/L (ref 3.5–5.1)
Sodium: 141 mmol/L (ref 135–145)

## 2015-11-28 LAB — I-STAT TROPONIN, ED
Troponin i, poc: 0 ng/mL (ref 0.00–0.08)
Troponin i, poc: 0 ng/mL (ref 0.00–0.08)

## 2015-11-28 LAB — MRSA PCR SCREENING: MRSA by PCR: NEGATIVE

## 2015-11-28 SURGERY — LEFT HEART CATH AND CORONARY ANGIOGRAPHY

## 2015-11-28 MED ORDER — NITROGLYCERIN 1 MG/10 ML FOR IR/CATH LAB
INTRA_ARTERIAL | Status: AC
  Filled 2015-11-28: qty 10

## 2015-11-28 MED ORDER — SODIUM CHLORIDE 0.9 % IV BOLUS (SEPSIS)
1000.0000 mL | Freq: Once | INTRAVENOUS | Status: AC
  Administered 2015-11-28: 1000 mL via INTRAVENOUS

## 2015-11-28 MED ORDER — NITROGLYCERIN 2 % TD OINT
1.0000 [in_us] | TOPICAL_OINTMENT | Freq: Four times a day (QID) | TRANSDERMAL | Status: AC
  Administered 2015-11-28: 1 [in_us] via TOPICAL
  Filled 2015-11-28: qty 1

## 2015-11-28 MED ORDER — HEPARIN (PORCINE) IN NACL 2-0.9 UNIT/ML-% IJ SOLN
INTRAMUSCULAR | Status: AC
  Filled 2015-11-28: qty 500

## 2015-11-28 MED ORDER — SODIUM CHLORIDE 0.9% FLUSH: 3.0000 mL | INTRAVENOUS | Status: DC | PRN

## 2015-11-28 MED ORDER — VITAMIN D 1000 UNITS PO TABS
2000.0000 [IU] | ORAL_TABLET | Freq: Every day | ORAL | Status: DC
  Administered 2015-11-29: 2000 [IU] via ORAL
  Filled 2015-11-28: qty 2

## 2015-11-28 MED ORDER — PANTOPRAZOLE SODIUM 40 MG PO TBEC
40.0000 mg | DELAYED_RELEASE_TABLET | Freq: Every day | ORAL | Status: DC
  Administered 2015-11-29: 40 mg via ORAL
  Filled 2015-11-28: qty 1

## 2015-11-28 MED ORDER — ALPRAZOLAM 0.25 MG PO TABS: 0.2500 mg | ORAL_TABLET | Freq: Three times a day (TID) | ORAL | Status: DC | PRN

## 2015-11-28 MED ORDER — SODIUM CHLORIDE 0.9 % IV SOLN: 250.0000 mL | INTRAVENOUS | Status: DC | PRN

## 2015-11-28 MED ORDER — SODIUM CHLORIDE 0.9 % IV SOLN
250.0000 mL | INTRAVENOUS | Status: DC | PRN
Start: 2015-11-28 — End: 2015-11-29

## 2015-11-28 MED ORDER — ACETAMINOPHEN 325 MG PO TABS
650.0000 mg | ORAL_TABLET | ORAL | Status: DC | PRN
  Administered 2015-11-28: 650 mg via ORAL
  Filled 2015-11-28: qty 2

## 2015-11-28 MED ORDER — LIDOCAINE HCL (PF) 1 % IJ SOLN
INTRAMUSCULAR | Status: AC
  Filled 2015-11-28: qty 30

## 2015-11-28 MED ORDER — NITROGLYCERIN 0.4 MG SL SUBL: 0.4000 mg | SUBLINGUAL_TABLET | SUBLINGUAL | Status: DC | PRN

## 2015-11-28 MED ORDER — GI COCKTAIL ~~LOC~~: 30.0000 mL | Freq: Four times a day (QID) | ORAL | Status: DC | PRN

## 2015-11-28 MED ORDER — ASPIRIN EC 325 MG PO TBEC
325.0000 mg | DELAYED_RELEASE_TABLET | Freq: Every day | ORAL | Status: DC
  Administered 2015-11-29: 325 mg via ORAL
  Filled 2015-11-28: qty 1

## 2015-11-28 MED ORDER — LORAZEPAM 2 MG/ML IJ SOLN
1.0000 mg | Freq: Once | INTRAMUSCULAR | Status: AC
  Administered 2015-11-28: 1 mg via INTRAVENOUS
  Filled 2015-11-28: qty 1

## 2015-11-28 MED ORDER — MIDAZOLAM HCL 2 MG/2ML IJ SOLN
INTRAMUSCULAR | Status: AC
  Filled 2015-11-28: qty 2

## 2015-11-28 MED ORDER — MORPHINE SULFATE (PF) 2 MG/ML IV SOLN: 1.0000 mg | INTRAVENOUS | Status: DC | PRN

## 2015-11-28 MED ORDER — HEPARIN (PORCINE) IN NACL 2-0.9 UNIT/ML-% IJ SOLN
INTRAMUSCULAR | Status: DC | PRN
Start: 2015-11-28 — End: 2015-11-28
  Administered 2015-11-28: 1000 mL

## 2015-11-28 MED ORDER — LORATADINE 10 MG PO TABS
10.0000 mg | ORAL_TABLET | Freq: Every day | ORAL | Status: DC
  Filled 2015-11-28: qty 1

## 2015-11-28 MED ORDER — SODIUM CHLORIDE 0.9 % WEIGHT BASED INFUSION: 1.0000 mL/kg/h | INTRAVENOUS | Status: DC

## 2015-11-28 MED ORDER — METOPROLOL TARTRATE 12.5 MG HALF TABLET: 12.5000 mg | ORAL_TABLET | Freq: Two times a day (BID) | ORAL | Status: DC

## 2015-11-28 MED ORDER — ASPIRIN 81 MG PO CHEW
324.0000 mg | CHEWABLE_TABLET | Freq: Once | ORAL | Status: AC
  Administered 2015-11-28: 324 mg via ORAL
  Filled 2015-11-28: qty 4

## 2015-11-28 MED ORDER — LIDOCAINE HCL (PF) 1 % IJ SOLN
INTRAMUSCULAR | Status: DC | PRN
  Administered 2015-11-28: 2 mL

## 2015-11-28 MED ORDER — IOPAMIDOL (ISOVUE-370) INJECTION 76%
INTRAVENOUS | Status: AC
  Filled 2015-11-28: qty 100

## 2015-11-28 MED ORDER — LIDOCAINE HCL (PF) 1 % IJ SOLN
INTRAMUSCULAR | Status: DC | PRN
  Administered 2015-11-28: 15 mL via INTRA_ARTERIAL

## 2015-11-28 MED ORDER — ONDANSETRON HCL 4 MG/2ML IJ SOLN: 4.0000 mg | Freq: Four times a day (QID) | INTRAMUSCULAR | Status: DC | PRN

## 2015-11-28 MED ORDER — HEPARIN SODIUM (PORCINE) 5000 UNIT/ML IJ SOLN
5000.0000 [IU] | Freq: Three times a day (TID) | INTRAMUSCULAR | Status: DC
  Administered 2015-11-28 – 2015-11-29 (×2): 5000 [IU] via SUBCUTANEOUS
  Filled 2015-11-28 (×2): qty 1

## 2015-11-28 MED ORDER — MORPHINE SULFATE (PF) 2 MG/ML IV SOLN
2.0000 mg | Freq: Once | INTRAVENOUS | Status: AC
  Administered 2015-11-28: 2 mg via INTRAVENOUS
  Filled 2015-11-28: qty 1

## 2015-11-28 MED ORDER — HYDROMORPHONE HCL 1 MG/ML IJ SOLN
INTRAMUSCULAR | Status: DC | PRN
  Administered 2015-11-28: 0.5 mg via INTRAVENOUS

## 2015-11-28 MED ORDER — ONDANSETRON HCL 4 MG/2ML IJ SOLN
4.0000 mg | Freq: Once | INTRAMUSCULAR | Status: AC
  Administered 2015-11-28: 4 mg via INTRAVENOUS
  Filled 2015-11-28: qty 2

## 2015-11-28 MED ORDER — HEPARIN SODIUM (PORCINE) 1000 UNIT/ML IJ SOLN
INTRAMUSCULAR | Status: AC
  Filled 2015-11-28: qty 1

## 2015-11-28 MED ORDER — SODIUM CHLORIDE 0.9 % WEIGHT BASED INFUSION
3.0000 mL/kg/h | INTRAVENOUS | Status: AC
  Administered 2015-11-28: 3 mL/kg/h via INTRAVENOUS

## 2015-11-28 MED ORDER — VERAPAMIL HCL 2.5 MG/ML IV SOLN
INTRAVENOUS | Status: AC
  Filled 2015-11-28: qty 2

## 2015-11-28 MED ORDER — HYDROMORPHONE HCL 1 MG/ML IJ SOLN
INTRAMUSCULAR | Status: AC
  Filled 2015-11-28: qty 1

## 2015-11-28 MED ORDER — MIDAZOLAM HCL 2 MG/2ML IJ SOLN
INTRAMUSCULAR | Status: DC | PRN
  Administered 2015-11-28: 1 mg via INTRAVENOUS

## 2015-11-28 MED ORDER — ONDANSETRON HCL 4 MG/2ML IJ SOLN: 4.0000 mg | Freq: Three times a day (TID) | INTRAMUSCULAR | Status: DC | PRN

## 2015-11-28 MED ORDER — SODIUM CHLORIDE 0.9% FLUSH: 3.0000 mL | Freq: Two times a day (BID) | INTRAVENOUS | Status: DC

## 2015-11-28 MED ORDER — IOPAMIDOL (ISOVUE-370) INJECTION 76%
INTRAVENOUS | Status: DC | PRN
  Administered 2015-11-28: 40 mL via INTRA_ARTERIAL

## 2015-11-28 MED ORDER — HEPARIN SODIUM (PORCINE) 1000 UNIT/ML IJ SOLN
INTRAMUSCULAR | Status: DC | PRN
  Administered 2015-11-28: 4000 [IU] via INTRAVENOUS

## 2015-11-28 MED ORDER — OMEGA-3-ACID ETHYL ESTERS 1 G PO CAPS
1.0000 g | ORAL_CAPSULE | Freq: Every day | ORAL | Status: DC
  Administered 2015-11-29: 09:00:00 1 g via ORAL
  Filled 2015-11-28 (×2): qty 1

## 2015-11-28 MED ORDER — SODIUM CHLORIDE 0.9 % WEIGHT BASED INFUSION: 3.0000 mL/kg/h | INTRAVENOUS | Status: DC

## 2015-11-28 MED ORDER — SODIUM CHLORIDE 0.9% FLUSH
3.0000 mL | Freq: Two times a day (BID) | INTRAVENOUS | Status: DC
  Administered 2015-11-28: 22:00:00 3 mL via INTRAVENOUS

## 2015-11-28 SURGICAL SUPPLY — 8 items
CATH OPTITORQUE TIG 4.0 5F (CATHETERS) ×2 IMPLANT
DEVICE RAD COMP TR BAND LRG (VASCULAR PRODUCTS) ×2 IMPLANT
GLIDESHEATH SLEND A-KIT 6F 20G (SHEATH) ×2 IMPLANT
KIT HEART LEFT (KITS) ×2 IMPLANT
PACK CARDIAC CATHETERIZATION (CUSTOM PROCEDURE TRAY) ×2 IMPLANT
TRANSDUCER W/STOPCOCK (MISCELLANEOUS) ×2 IMPLANT
TUBING CIL FLEX 10 FLL-RA (TUBING) ×2 IMPLANT
WIRE SAFE-T 1.5MM-J .035X260CM (WIRE) ×2 IMPLANT

## 2015-11-28 NOTE — ED Notes (Signed)
MD at bedside. 

## 2015-11-28 NOTE — ED Provider Notes (Signed)
CSN: [id number]     Arrival date & time 11/28/15  1140 History  By signing my name below, I, Placido Sou, attest that this documentation has been prepared under the direction and in the presence of Danelle Berry, PA-C. Electronically Signed: Placido Sou, ED Scribe. 11/28/2015. 1:13 PM.   Chief Complaint  Patient presents with  . Chest Pain  . Shortness of Breath   The history is provided by the patient and the spouse. No language interpreter was used.    HPI Comments: [name] is a 50 y.o. female who presents to the Emergency Department complaining of gradually increasing, 10/10, sharp and squeezing, left sided CP with radiation to left arm, that began at 8am while headed to work this morning, and acutely worsening in severity at 10:45.  When pain worsened she had associated SOB, nausea and urinary incontinence. The pt believes that her symptoms may be related to anxiety with a stressful situation at work.  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  Today she felt even more anxious prior to the onset of symptoms while heading to work.  Her CP started at 8am, worsened at 10:45 has been constant until her presentation to the ER.  The pt had her husband get her from work and he then drove her to the ER for evaluation.  Once she arrived in the ER, she changed out of her soiled clothes.  She states that with ambulation CP is unchanged, but she felt dizzy and light headed.  CP is not aggravated with palpation of chest wall, inspiration, positional changes or exertion.  Pt denies diaphoresis, pallor, vomiting, bowel incontinence, orthopnea, PND, LE edema, wheeze, cough, recent travel, cocaine use.  She complains of intermittent palpitations which have been ongoing for the past several months, usually associated with periods of stress.   She denies hx of DM, HTN, HLD.  No cardiac family  hx.  Pt notes a PMHx of stroke in 2003 which she was told was an "unexplained neurological episode" where she was unresponsive and lost control of all extremities.  She states workup did include an echo and she reports a heart murmur, but states she never was put on any medication after the "stoke."  She does not have any lasting deficit, and she denies any cocaine involvement.   She further denies hx of brain or GI bleed.     Past Medical History  Diagnosis Date  . Seasonal allergies    Past Surgical History  Procedure Laterality Date  . Abdominal hysterectomy     No family history on file. Social History  Substance Use Topics  . Smoking status: Never Smoker   . Smokeless tobacco: None  . Alcohol Use: 0.0 oz/week    0 Standard drinks or equivalent per week   OB History    No data available     Review of Systems A complete 10 system review of systems was obtained and all systems are negative except as noted in the HPI and PMH.    Allergies  Tomato  Home Medications   Prior to Admission medications   Medication Sig Start Date End Date Taking? Authorizing Provider  albuterol (PROVENTIL HFA;VENTOLIN HFA) 108 (90 Base) MCG/ACT inhaler Inhale 1-2 puffs into the lungs every 6 (six) hours as needed for wheezing or shortness of breath.   Yes Historical Provider, MD  BIOTIN PO Take  1 tablet by mouth daily.   Yes Historical Provider, MD  cetirizine (ZYRTEC) 10 MG tablet Take 10 mg by mouth daily.   Yes Historical Provider, MD  Cholecalciferol (VITAMIN D PO) Take 2,000 Units by mouth daily.    Yes Historical Provider, MD  Omega-3 Fatty Acids (FISH OIL PO) Take 1 capsule by mouth daily.   Yes Historical Provider, MD   BP 102/62 mmHg  Pulse 53  Temp(Src) 97.8 F (36.6 C) (Oral)  Resp 11  Ht 5\' 9"  (1.753 m)  Wt 74 kg  BMI 24.08 kg/m2  SpO2 96% Physical Exam  Constitutional: She is oriented to person, place, and time. Vital signs are normal. She appears well-developed and  well-nourished. She is cooperative.  Non-toxic appearance. No distress.  HENT:  Head: Normocephalic and atraumatic.  Right Ear: External ear normal.  Left Ear: External ear normal.  Nose: Nose normal.  Mouth/Throat: Uvula is midline and oropharynx is clear and moist. Mucous membranes are not pale, dry and not cyanotic. No trismus in the jaw. No oropharyngeal exudate.  Eyes: Conjunctivae, EOM and lids are normal. Pupils are equal, round, and reactive to light. Right eye exhibits no discharge. Left eye exhibits no discharge. No scleral icterus.  Neck: Normal range of motion, full passive range of motion without pain and phonation normal. Neck supple. No JVD present. Carotid bruit is not present. No tracheal deviation present. No thyromegaly present.  Cardiovascular: Normal rate, regular rhythm, normal heart sounds and intact distal pulses.  PMI is not displaced.  Exam reveals no gallop and no friction rub.   No murmur heard. Pulses:      Radial pulses are 1+ on the right side, and 1+ on the left side.       Dorsalis pedis pulses are 2+ on the right side, and 2+ on the left side.       Posterior tibial pulses are 2+ on the right side, and 2+ on the left side.  Pulmonary/Chest: Effort normal and breath sounds normal. No accessory muscle usage. No tachypnea. No respiratory distress. She has no decreased breath sounds. She has no wheezes. She has no rhonchi. She has no rales. She exhibits no tenderness.  Abdominal: Soft. Normal appearance and bowel sounds are normal. She exhibits no distension and no mass. There is no tenderness. There is no rigidity, no rebound and no guarding.  Musculoskeletal: Normal range of motion. She exhibits no edema or tenderness.  Lymphadenopathy:    She has no cervical adenopathy.  Neurological: She is alert and oriented to person, place, and time. She has normal strength and normal reflexes. She is not disoriented. No cranial nerve deficit or sensory deficit. She exhibits  normal muscle tone. Coordination normal. GCS eye subscore is 4. GCS verbal subscore is 5. GCS motor subscore is 6.  CN 2-12 grossly intact, no facial droop Strength 5/5 in upper and lower extremities Normal coordination  Skin: Skin is warm and dry. No rash noted. She is not diaphoretic. No cyanosis or erythema. No pallor. Nails show no clubbing.  Psychiatric: Her speech is normal and behavior is normal. Judgment and thought content normal. Her mood appears anxious.  Nursing note and vitals reviewed.  ED Course  Procedures  DIAGNOSTIC STUDIES: Oxygen Saturation is 100% on RA, normal by my interpretation.    COORDINATION OF CARE: 1:12 PM Discussed next steps with pt. She verbalized understanding and is agreeable with the plan.   Labs Review Labs Reviewed  CBC - Abnormal; Notable for  the following:    Hemoglobin 11.5 (*)    HCT 35.8 (*)    All other components within normal limits  LIPID PANEL - Abnormal; Notable for the following:    LDL Cholesterol 110 (*)    All other components within normal limits  MRSA PCR SCREENING  BASIC METABOLIC PANEL  D-DIMER, QUANTITATIVE (NOT AT [facility])  Rosezena Sensor, ED  Rosezena Sensor, ED    Imaging Review Dg Chest 2 View  11/28/2015  CLINICAL DATA:  Panic attack.  Chest pain with cough for 1 day. EXAM: CHEST  2 VIEW COMPARISON:  None. FINDINGS: The heart size and mediastinal contours are within normal limits. Both lungs are clear. The visualized skeletal structures are unremarkable. IMPRESSION: No active cardiopulmonary disease. Electronically Signed   By: Elsie Stain M.D.   On: 11/28/2015 12:56   I have personally reviewed and evaluated these images and lab results as part of my medical decision-making.   EKG Interpretation   Date/Time:  Monday November 28 2015 13:15:29 EDT Ventricular Rate:  68 PR Interval:  162 QRS Duration: 86 QT Interval:  429 QTC Calculation: 456 R Axis:   73 Text Interpretation:  Sinus rhythm Nonspecific T  abnormalities, anterior  leads Confirmed by Juleen China  MD, STEPHEN ([id number]) on 11/28/2015 1:31:31 PM      MDM   Pt with left upper chest pain, sharp and squeezing, onset 8 am, worse at 1045, with radiation to L arm associated with SOB, N and urinary incontinence.  Pt suspects it could be anxiety, however she has never had sx like this before, no hx of panic attacks. Remote hx of stroke in 2003, no other risk factors - pt is active, no hx of HTN, DM, HLD, no family hx, non-smoker, however, story is suspicious for ACS, EKG abnormal - has TWI V1-V4, no prior EKG's  No concern for PE as she is PERC negative Pain may be anxiety, however given EKG changes feel she must be admitted for further work up.   Case discussed with Dr. Juleen China, who agrees with anxiety vs ACS.  Asked for cardiology consult, but will need admission.  In the ER she was given 325 ASA, 2 mg morphine, zofran, IVF, CP improved to 5/10 and nitropaste was applied.  1415- Spoke with Dr. Jacinto Halim from cardiology, given unusual history, he requested lipids, d-dimer, repeat troponin and hospitalist to see.  Orders added, and hospitalist paged  1425- While in the room with pt and husband, pt was sleepy and dropped O2 sats to 80%.  I sat her up and sats improved to 88%.  She complained of brief worsening CP and was placed in 2L [place] SpO2 increased to 100%.  Approximately 20 minutes later pt was placed back on RA and maintained SpO2 at 100%.  She is very sleepy with small morphine dose and 1 mg ativan, but easily aroused.  Patient may have dropped sat's secondary to meds.    D-dimer and repeat troponin were negative.  Dr. Jerral Ralph to admit.  Pt continues to have 3/10 chest pain.  Given her sleepiness, do not feel I can be more aggressive with pain medication.  Given active CP pt will have to be admitted to SDU.   Pt updated with plan.  Final diagnoses:  Chest pain, unspecified chest pain type   I personally performed the services described in this  documentation, which was scribed in my presence. The recorded information has been reviewed and is accurate.  Danelle BerryLeisa Velva Molinari, PA-C 11/28/15 2327  Raeford RazorStephen Kohut, MD 11/30/15 [phone]

## 2015-11-28 NOTE — H&P (View-Only) (Signed)
CARDIOLOGY CONSULT NOTE  Patient ID: [name] MRN: [id number] DOB/AGE: 09/03/1965 50 y.o.  Admit date: 11/28/2015 Referring Physician  Shankar Ghimire, MD Primary Physician:  SANDERS,ROBYN N, MD Reason for Consultation  Chest pain  HPI: [name]  is a 50 y.o. female  With Patient with no significant prior cardiac history, no history of known coronary artery disease, diabetes or hypertension or hyperlipidemia admitted with chest pain described as 10 out of 10 in intensity associated with left arm tingling, nausea and shortness of breath.  She also had an episode of incontinence while being driven by her husband from work to the emergency room.  On presentation to the emergency room, she was found to have abnormal EKG.  Patient appears to be extremely anxious.  She was referred to me for further evaluation of the chest pain. Chest pain described as sharp pain, no exacerbating or relieving factor. No recent travel. No leg edema, no painful swelling of the legs.   Past Medical History  Diagnosis Date  . Seasonal allergies      Past Surgical History  Procedure Laterality Date  . Abdominal hysterectomy      Family history: No  H/o premature CAD in the family.   Social History: Social History   Social History  . Marital Status: Single    Spouse Name: N/A  . Number of Children: N/A  . Years of Education: N/A   Occupational History  . Not on file.   Social History Main Topics  . Smoking status: Never Smoker   . Smokeless tobacco: Not on file  . Alcohol Use: 0.0 oz/week    0 Standard drinks or equivalent per week  . Drug Use: No  . Sexual Activity: Not on file   Other Topics Concern  . Not on file   Social History Narrative     Prescriptions prior to admission  Medication Sig Dispense Refill Last Dose  . albuterol (PROVENTIL HFA;VENTOLIN HFA) 108 (90 Base) MCG/ACT inhaler Inhale 1-2 puffs into the lungs every 6 (six) hours as needed for wheezing or  shortness of breath.   Past Month at Unknown time  . BIOTIN PO Take 1 tablet by mouth daily.   11/27/2015 at Unknown time  . cetirizine (ZYRTEC) 10 MG tablet Take 10 mg by mouth daily.   11/27/2015 at Unknown time  . Cholecalciferol (VITAMIN D PO) Take 2,000 Units by mouth daily.    11/27/2015 at Unknown time  . Omega-3 Fatty Acids (FISH OIL PO) Take 1 capsule by mouth daily.   11/27/2015 at Unknown time     ROS: General: no fevers/chills/night sweats Eyes: no blurry vision, diplopia, or amaurosis ENT: no sore throat or hearing loss Resp: no cough, wheezing, or hemoptysis CV: no edema or palpitations GI: no abdominal pain, no further nausea, vomiting, diarrhea, or constipation GU: no dysuria, frequency, or hematuria Skin: no rash Neuro: no headache, numbness, tingling, or weakness of extremities Musculoskeletal: no joint pain or swelling Heme: no bleeding, DVT, or easy bruising Endo: no polydipsia or polyuria   Physical Exam: Blood pressure 127/71, pulse 44, temperature 98.1 F (36.7 C), temperature source Oral, resp. rate 12, SpO2 99 %.   General appearance: alert, cooperative, appears stated age and no distress Lungs: clear to auscultation bilaterally Heart: regular rate and rhythm, S1, S2 normal, no murmur, click, rub or gallop Abdomen: soft, non-tender; bowel sounds normal; no masses,  no organomegaly Extremities: extremities normal, atraumatic, no cyanosis or edema Pulses: 2+ and symmetric   Neurologic: Grossly normal  Labs:   Lab Results  Component Value Date   WBC 5.3 11/28/2015   HGB 11.5* 11/28/2015   HCT 35.8* 11/28/2015   MCV 86.7 11/28/2015   PLT 377 11/28/2015    Recent Labs Lab 11/28/15 1200  NA 141  K 3.9  CL 107  CO2 25  BUN 13  CREATININE 0.97  CALCIUM 9.5  GLUCOSE 94   EKG 11/28/2015: Normal sinus rhythm at rate of 68 bpm, normal axis, nonspecific wave inversion noted in V1 to V4, cannot exclude ischemia.  Normal QT interval.  Radiology: Dg Chest 2  View  11/28/2015  CLINICAL DATA:  Panic attack.  Chest pain with cough for 1 day. EXAM: CHEST  2 VIEW COMPARISON:  None. FINDINGS: The heart size and mediastinal contours are within normal limits. Both lungs are clear. The visualized skeletal structures are unremarkable. IMPRESSION: No active cardiopulmonary disease. Electronically Signed   By: John T Curnes M.D.   On: 11/28/2015 12:56    Scheduled Meds: . [START ON 11/29/2015] aspirin EC  325 mg Oral Daily  . cholecalciferol  2,000 Units Oral Daily  . heparin  5,000 Units Subcutaneous 3 times per day  . loratadine  10 mg Oral Daily  . metoprolol tartrate  12.5 mg Oral BID  . omega-3 acid ethyl esters  1 g Oral Daily  . pantoprazole  40 mg Oral Daily   Continuous Infusions:  PRN Meds:.acetaminophen, ALPRAZolam, gi cocktail, morphine injection, nitroGLYCERIN, ondansetron (ZOFRAN) IV  ASSESSMENT AND PLAN:  Atypical chest pain Abnormal EKG.  Rec: In spite of cardiac markers being negative with ongoing chest discomfort for the past several hours due to abnormal EKG and ongoing chest discomfort felt that we should proceed with angiography.lipid profile is pending  Cardiac markers and d-dimer negative.   Discussed with the patient and she is willing to proceed.  Discussed risks, benefits and alternatives of angiogram including but not limited to <1% risk of death, stroke, MI, need for urgent surgical revascularization, renal failure, but not limited to thest. patient is willing to proceed.   Farren Landa, MD 11/28/2015, 5:04 PM [facility]. [place] Pager: [phone] Office: [phone] If no answer Cell [phone]  
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
## 2015-11-28 NOTE — ED Notes (Signed)
Lab states they will run blood on previously collected blood 

## 2015-11-28 NOTE — ED Notes (Signed)
PT is currently in restroom changing

## 2015-11-28 NOTE — Progress Notes (Signed)
TR BAND REMOVAL  LOCATION:    right radial  DEFLATED PER PROTOCOL:    Yes.    TIME BAND OFF / DRESSING APPLIED:    2100- sterile 2x2 and tegaderm   SITE UPON ARRIVAL:    Level 0  SITE AFTER BAND REMOVAL:    Level 0  CIRCULATION SENSATION AND MOVEMENT:    Within Normal Limits   Yes.    COMMENTS:   TR band removal instructions given; wrist limitations verbalized and pt demonstrates and verbalized understanding

## 2015-11-28 NOTE — Consult Note (Addendum)
CARDIOLOGY CONSULT NOTE  Patient ID: [name] MRN: [id number] DOB/AGE: 12/25/1965 50 y.o.  Admit date: 11/28/2015 Referring Physician  Caryl Comes, MD Primary Physician:  [name], MD Reason for Consultation  Chest pain  HPI: [name]  is a 50 y.o. female  With Patient with no significant prior cardiac history, no history of known coronary artery disease, diabetes or hypertension or hyperlipidemia admitted with chest pain described as 10 out of 10 in intensity associated with left arm tingling, nausea and shortness of breath.  She also had an episode of incontinence while being driven by her husband from work to the emergency room.  On presentation to the emergency room, she was found to have abnormal EKG.  Patient appears to be extremely anxious.  She was referred to me for further evaluation of the chest pain. Chest pain described as sharp pain, no exacerbating or relieving factor. No recent travel. No leg edema, no painful swelling of the legs.   Past Medical History  Diagnosis Date  . Seasonal allergies      Past Surgical History  Procedure Laterality Date  . Abdominal hysterectomy      Family history: No  H/o premature CAD in the family.   Social History: Social History   Social History  . Marital Status: Single    Spouse Name: N/A  . Number of Children: N/A  . Years of Education: N/A   Occupational History  . Not on file.   Social History Main Topics  . Smoking status: Never Smoker   . Smokeless tobacco: Not on file  . Alcohol Use: 0.0 oz/week    0 Standard drinks or equivalent per week  . Drug Use: No  . Sexual Activity: Not on file   Other Topics Concern  . Not on file   Social History Narrative     Prescriptions prior to admission  Medication Sig Dispense Refill Last Dose  . albuterol (PROVENTIL HFA;VENTOLIN HFA) 108 (90 Base) MCG/ACT inhaler Inhale 1-2 puffs into the lungs every 6 (six) hours as needed for wheezing or  shortness of breath.   Past Month at Unknown time  . BIOTIN PO Take 1 tablet by mouth daily.   11/27/2015 at Unknown time  . cetirizine (ZYRTEC) 10 MG tablet Take 10 mg by mouth daily.   11/27/2015 at Unknown time  . Cholecalciferol (VITAMIN D PO) Take 2,000 Units by mouth daily.    11/27/2015 at Unknown time  . Omega-3 Fatty Acids (FISH OIL PO) Take 1 capsule by mouth daily.   11/27/2015 at Unknown time     ROS: General: no fevers/chills/night sweats Eyes: no blurry vision, diplopia, or amaurosis ENT: no sore throat or hearing loss Resp: no cough, wheezing, or hemoptysis CV: no edema or palpitations GI: no abdominal pain, no further nausea, vomiting, diarrhea, or constipation GU: no dysuria, frequency, or hematuria Skin: no rash Neuro: no headache, numbness, tingling, or weakness of extremities Musculoskeletal: no joint pain or swelling Heme: no bleeding, DVT, or easy bruising Endo: no polydipsia or polyuria   Physical Exam: Blood pressure 127/71, pulse 44, temperature 98.1 F (36.7 C), temperature source Oral, resp. rate 12, SpO2 99 %.   General appearance: alert, cooperative, appears stated age and no distress Lungs: clear to auscultation bilaterally Heart: regular rate and rhythm, S1, S2 normal, no murmur, click, rub or gallop Abdomen: soft, non-tender; bowel sounds normal; no masses,  no organomegaly Extremities: extremities normal, atraumatic, no cyanosis or edema Pulses: 2+ and symmetric  Neurologic: Grossly normal  Labs:   Lab Results  Component Value Date   WBC 5.3 11/28/2015   HGB 11.5* 11/28/2015   HCT 35.8* 11/28/2015   MCV 86.7 11/28/2015   PLT 377 11/28/2015    Recent Labs Lab 11/28/15 1200  NA 141  K 3.9  CL 107  CO2 25  BUN 13  CREATININE 0.97  CALCIUM 9.5  GLUCOSE 94   EKG 11/28/2015: Normal sinus rhythm at rate of 68 bpm, normal axis, nonspecific wave inversion noted in V1 to V4, cannot exclude ischemia.  Normal QT interval.  Radiology: Dg Chest 2  View  11/28/2015  CLINICAL DATA:  Panic attack.  Chest pain with cough for 1 day. EXAM: CHEST  2 VIEW COMPARISON:  None. FINDINGS: The heart size and mediastinal contours are within normal limits. Both lungs are clear. The visualized skeletal structures are unremarkable. IMPRESSION: No active cardiopulmonary disease. Electronically Signed   By: Elsie StainJohn T Curnes M.D.   On: 11/28/2015 12:56    Scheduled Meds: . [START ON 11/29/2015] aspirin EC  325 mg Oral Daily  . cholecalciferol  2,000 Units Oral Daily  . heparin  5,000 Units Subcutaneous 3 times per day  . loratadine  10 mg Oral Daily  . metoprolol tartrate  12.5 mg Oral BID  . omega-3 acid ethyl esters  1 g Oral Daily  . pantoprazole  40 mg Oral Daily   Continuous Infusions:  PRN Meds:.acetaminophen, ALPRAZolam, gi cocktail, morphine injection, nitroGLYCERIN, ondansetron (ZOFRAN) IV  ASSESSMENT AND PLAN:  Atypical chest pain Abnormal EKG.  Rec: In spite of cardiac markers being negative with ongoing chest discomfort for the past several hours due to abnormal EKG and ongoing chest discomfort felt that we should proceed with angiography.lipid profile is pending  Cardiac markers and d-dimer negative.   Discussed with the patient and she is willing to proceed.  Discussed risks, benefits and alternatives of angiogram including but not limited to <1% risk of death, stroke, MI, need for urgent surgical revascularization, renal failure, but not limited to thest. patient is willing to proceed.   Yates DecampGANJI, Elmer Merwin, MD 11/28/2015, 5:04 PM [facility] Pager: [phone] Office: [phone] If no answer Cell [phone]

## 2015-11-28 NOTE — ED Provider Notes (Signed)
Medical screening examination/treatment/procedure(s) were conducted as a shared visit with non-physician practitioner(s) and myself.  I personally evaluated the patient during the encounter.   EKG Interpretation   Date/Time:  Monday November 28 2015 13:15:29 EDT Ventricular Rate:  68 PR Interval:  162 QRS Duration: 86 QT Interval:  429 QTC Calculation: 456 R Axis:   73 Text Interpretation:  Sinus rhythm Nonspecific T abnormalities, anterior  leads Confirmed by Juleen ChinaKOHUT  MD, Marlen Koman ([id number]) on 11/28/2015 1:31:31 PM     49yF with CP. Several atypical features but some also more typical. She also strikes me as fairly anxious and symptoms started after having some issues with a coworker. That being said, her EKG is abnormal. No old for comparison. Initial troponin is  I'm not convinced with is ACS but I do not feel she is low enough risk for discharge either.   Raeford RazorStephen Tenleigh Byer, MD 11/30/15 [phone]

## 2015-11-28 NOTE — Interval H&P Note (Signed)
History and Physical Interval Note:  11/28/2015 6:04 PM  Wilburn CorneliaCynthia W Coley  has presented today for surgery, with the diagnosis of cp  The various methods of treatment have been discussed with the patient and family. After consideration of risks, benefits and other options for treatment, the patient has consented to  Procedure(s): Left Heart Cath and Coronary Angiography (N/A) and possible PCI as a surgical intervention .  The patient's history has been reviewed, patient examined, no change in status, stable for surgery.  I have reviewed the patient's chart and labs.  Questions were answered to the patient's satisfaction.   Cath Lab Visit (complete for each Cath Lab visit)  Clinical Evaluation Leading to the Procedure:   ACS: Yes.    Non-ACS:    Anginal Classification: CCS IV  Anti-ischemic medical therapy: No Therapy  Non-Invasive Test Results: No non-invasive testing performed  Prior CABG: No previous CABG        Yates DecampGANJI, Brady Plant

## 2015-11-29 ENCOUNTER — Encounter (HOSPITAL_COMMUNITY): Payer: Self-pay | Admitting: Cardiology

## 2015-11-29 DIAGNOSIS — F410 Panic disorder [episodic paroxysmal anxiety] without agoraphobia: Secondary | ICD-10-CM | POA: Diagnosis present

## 2015-11-29 DIAGNOSIS — R0789 Other chest pain: Secondary | ICD-10-CM

## 2015-11-29 DIAGNOSIS — F411 Generalized anxiety disorder: Secondary | ICD-10-CM | POA: Diagnosis present

## 2015-11-29 MED ORDER — ALPRAZOLAM 0.25 MG PO TABS: 0.2500 mg | ORAL_TABLET | Freq: Two times a day (BID) | ORAL | Status: DC | PRN

## 2015-11-29 MED ORDER — ASPIRIN EC 81 MG PO TBEC: 81.0000 mg | DELAYED_RELEASE_TABLET | Freq: Every day | ORAL | Status: DC

## 2015-11-29 NOTE — Discharge Summary (Addendum)
Physician Discharge Summary  [name]:[id number] DOB: 04/10/1966 DOA: 11/28/2015  PCP: [name], MD  Admit date: 11/28/2015 Discharge date: 11/29/2015  Time spent: 35 minutes  Recommendations for Outpatient Follow-up:  1. Patient admitted for chest pain, likely secondary to esiding/panic attack, status post cardiac catheterization showing normal coronary arteries 2. She was discharged on 0.25 mg of Xanax by mouth twice a day when necessary, ace follow-up on symptoms.   Discharge Diagnoses:  Principal Problem:   Chest pain Active Problems:   Panic attack   Anxiety state   Discharge Condition: Stable  Diet recommendation: Heart healthy  Filed Weights   11/28/15 2000  Weight: 74 kg (163 lb 2.3 oz)    History of present illness:  [name] is a 50 y.o. female presenting to the ED with sudden-onset of sharp 10/10 left-sided chest pain, left arm tingling, nausea, and SOB since this morning. Had similar symptoms on Friday after a verbal altercation with a coworker, and states she was in the presence of this coworker again this morning which made her anxious. Was unable to calm herself down or relieve the chest pain with deep breathing or meditation, at which point she called her primary care office and they told her to come to the ED today. States her pain feels better currently and is not as sharp following ASA, ativan, morphine, nitroglycerin, and zofran administration in the ED.   Denies personal history of heart disease, HTN, DM, or HLD. Denies recent travel, birth control use, or cough. Of note- had a stroke requiring PT over 10 years ago, not on anticoagulants, no focal residual weakness, negative cardiac workup at that time.  Hospital Course:  Mrs [name] s a pleasant 50 year old female with a past medical history of CVA, admitted on 11/28/2015 when she presented with complaints of chest pain having atypical features. She reported having chest pain  symptoms after having altercation with coworker. She states that this has been difficult situation for her with work situation becoming worse over the past 3 months, and attributed chest pain to stress/anxiety.she had 1 cardiac catheterization on 11/28/2015 which revealed normal coronary arteries. Postprocedure she remained stable without immediate applications. I think symptoms likely related to anxiety/stress. She was discharged on Xanax or 0.25 mg by mouth twice a day as needed. Recommended she follow up with her primary care provider the next 1-2 weeks or close follow-up.  Procedures:  Cardiac catheterization performed on 11/28/2015 Impression:  The left ventricular systolic function is normal.  normal coronary arteries  Rec: Evaluation for noncardiac causes of chest pain is indicated. Disposition: Will be discharged home tomorrow with outpatient follow up.  Consultations:  Cardiology  Discharge Exam: Filed Vitals:   11/29/15 0643 11/29/15 0733  BP: 122/73 100/67  Pulse: 73 80  Temp: 98.1 F (36.7 C) 97 F (36.1 C)  Resp: 20 17    General: Patient is awake and alert,becoming tearful when asked about her work situation Cardiovascular: regular rate and rhythm normal S1-S2 Respiratory: lungs clear to auscultation bilaterally Abdomen: soft nontender nondistended  Discharge Instructions   Discharge Instructions    Call MD for:  difficulty breathing, headache or visual disturbances    Complete by:  As directed      Call MD for:  extreme fatigue    Complete by:  As directed      Call MD for:  hives    Complete by:  As directed      Call MD for:  persistant  dizziness or light-headedness    Complete by:  As directed      Call MD for:  persistant nausea and vomiting    Complete by:  As directed      Call MD for:  redness, tenderness, or signs of infection (pain, swelling, redness, odor or green/yellow discharge around incision site)    Complete by:  As directed      Call MD  for:  severe uncontrolled pain    Complete by:  As directed      Call MD for:  temperature >100.4    Complete by:  As directed      Call MD for:    Complete by:  As directed      Diet - low sodium heart healthy    Complete by:  As directed      Increase activity slowly    Complete by:  As directed           Current Discharge Medication List    START taking these medications   Details  ALPRAZolam (XANAX) 0.25 MG tablet Take 1 tablet (0.25 mg total) by mouth 2 (two) times daily as needed for anxiety. Qty: 15 tablet, Refills: 0    aspirin EC 81 MG tablet Take 1 tablet (81 mg total) by mouth daily. Qty: 30 tablet, Refills: 0      CONTINUE these medications which have NOT CHANGED   Details  albuterol (PROVENTIL HFA;VENTOLIN HFA) 108 (90 Base) MCG/ACT inhaler Inhale 1-2 puffs into the lungs every 6 (six) hours as needed for wheezing or shortness of breath.    BIOTIN PO Take 1 tablet by mouth daily.    cetirizine (ZYRTEC) 10 MG tablet Take 10 mg by mouth daily.    Cholecalciferol (VITAMIN D PO) Take 2,000 Units by mouth daily.     Omega-3 Fatty Acids (FISH OIL PO) Take 1 capsule by mouth daily.       Allergies  Allergen Reactions  . Tomato Anaphylaxis   Follow-up Information    Follow up with [name], MD In 1 week.   Specialty:  Internal Medicine   Contact information:   [address] [place] [address] [phone]        The results of significant diagnostics from this hospitalization (including imaging, microbiology, ancillary and laboratory) are listed below for reference.    Significant Diagnostic Studies: Dg Chest 2 View  11/28/2015  CLINICAL DATA:  Panic attack.  Chest pain with cough for 1 day. EXAM: CHEST  2 VIEW COMPARISON:  None. FINDINGS: The heart size and mediastinal contours are within normal limits. Both lungs are clear. The visualized skeletal structures are unremarkable. IMPRESSION: No active cardiopulmonary disease.  Electronically Signed   By: Elsie Stain M.D.   On: 11/28/2015 12:56   Mm Digital Screening Bilateral  11/08/2015  CLINICAL DATA:  Screening. EXAM: DIGITAL SCREENING BILATERAL MAMMOGRAM WITH CAD COMPARISON:  Previous exam(s). ACR Breast Density Category c: The breast tissue is heterogeneously dense, which may obscure small masses. FINDINGS: There are no findings suspicious for malignancy. Images were processed with CAD. IMPRESSION: No mammographic evidence of malignancy. A result letter of this screening mammogram will be mailed directly to the patient. RECOMMENDATION: Screening mammogram in one year. (Code:SM-B-01Y) BI-RADS CATEGORY  1: Negative. Electronically Signed   By: Gerome Sam III M.D   On: 11/08/2015 16:34    Microbiology: Recent Results (from the past 240 hour(s))  MRSA PCR Screening     Status: None  Collection Time: 11/28/15  4:44 PM  Result Value Ref Range Status   MRSA by PCR NEGATIVE NEGATIVE Final    Comment:        The GeneXpert MRSA Assay (FDA approved for NASAL specimens only), is one component of a comprehensive MRSA colonization surveillance program. It is not intended to diagnose MRSA infection nor to guide or monitor treatment for MRSA infections.      Labs: Basic Metabolic Panel:  Recent Labs Lab 11/28/15 1200  NA 141  K 3.9  CL 107  CO2 25  GLUCOSE 94  BUN 13  CREATININE 0.97  CALCIUM 9.5   Liver Function Tests: No results for input(s): AST, ALT, ALKPHOS, BILITOT, PROT, ALBUMIN in the last 168 hours. No results for input(s): LIPASE, AMYLASE in the last 168 hours. No results for input(s): AMMONIA in the last 168 hours. CBC:  Recent Labs Lab 11/28/15 1200  WBC 5.3  HGB 11.5*  HCT 35.8*  MCV 86.7  PLT 377   Cardiac Enzymes: No results for input(s): CKTOTAL, CKMB, CKMBINDEX, TROPONINI in the last 168 hours. BNP: BNP (last 3 results) No results for input(s): BNP in the last 8760 hours.  ProBNP (last 3 results) No results for  input(s): PROBNP in the last 8760 hours.  CBG: No results for input(s): GLUCAP in the last 168 hours.     Signed:  Jeralyn BennettZAMORA, Aniyla Harling MD.  [facility] 11/29/2015, 8:26 AM

## 2015-11-29 NOTE — Progress Notes (Signed)
Subjective:  Feels much better. C/O minimal chest discomfort. No dyspnea.  Objective:  Vital Signs in the last 24 hours: Temp:  [97 F (36.1 C)-98.4 F (36.9 C)] 97 F (36.1 C) (04/04 0733) Pulse Rate:  [0-232] 80 (04/04 0733) Resp:  [0-21] 17 (04/04 0733) BP: (96-161)/(58-98) 100/67 mmHg (04/04 0733) SpO2:  [0 %-100 %] 100 % (04/04 0733) Weight:  [74 kg (163 lb 2.3 oz)] 74 kg (163 lb 2.3 oz) (04/03 2000)  Intake/Output from previous day: 04/03 0701 - 04/04 0700 In: 175.5 [P.O.:120; I.V.:55.5] Out: 800 [Urine:800]  Physical Exam:  General appearance: alert, cooperative, appears stated age and no distress Eyes: negative findings: lids and lashes normal Neck: no adenopathy, no carotid bruit, no JVD, supple, symmetrical, trachea midline and thyroid not enlarged, symmetric, no tenderness/mass/nodules Neck: JVP - normal, carotids 2+= without bruits Resp: clear to auscultation bilaterally Chest wall: no tenderness Cardio: regular rate and rhythm, S1, S2 normal, no murmur, click, rub or gallop GI: soft, non-tender; bowel sounds normal; no masses,  no organomegaly Extremities: extremities normal, atraumatic, no cyanosis or edema    Lab Results: BMP  Recent Labs  11/28/15 1200  NA 141  K 3.9  CL 107  CO2 25  GLUCOSE 94  BUN 13  CREATININE 0.97  CALCIUM 9.5  GFRNONAA >60  GFRAA >60    CBC  Recent Labs Lab 11/28/15 1200  WBC 5.3  RBC 4.13  HGB 11.5*  HCT 35.8*  PLT 377  MCV 86.7  MCH 27.8  MCHC 32.1  RDW 14.2    HEMOGLOBIN A1C No results found for: HGBA1C, MPG  Cardiac Panel (last 3 results) No results for input(s): CKTOTAL, CKMB, TROPONINI, RELINDX in the last 8760 hours.  BNP (last 3 results) No results for input(s): PROBNP in the last 8760 hours.  TSH No results for input(s): TSH in the last 8760 hours.  CHOLESTEROL  Recent Labs  11/28/15 1200  CHOL 187    Hepatic Function Panel No results for input(s): PROT, ALBUMIN, AST, ALT, ALKPHOS,  BILITOT, BILIDIR, IBILI in the last 8760 hours.  Imaging: Imaging results have been reviewed  Cardiac Studies:  EKG: normal EKG,  Non specific anterior T inversion   Assessment/Plan:  Chest pain non cardiac probably GERD and musculoskeletal component.  Normal coronary arteries and normal LV function.  Rec: Evaluate for non cardiac CP. Lipids minimally elevated LDL, d/w patient regarding low fat diet. No therapy needed for now. F/U with me PRN. Right radial access without complications.   Yates DecampGANJI, Zaleah Ternes, M.D. 11/29/2015, 8:55 AM [facility] Pager: [phone] Office: [phone] If no answer: [phone]

## 2015-11-30 DIAGNOSIS — M7631 Iliotibial band syndrome, right leg: Secondary | ICD-10-CM | POA: Diagnosis not present

## 2015-11-30 DIAGNOSIS — M7061 Trochanteric bursitis, right hip: Secondary | ICD-10-CM | POA: Diagnosis not present

## 2015-12-06 DIAGNOSIS — M7061 Trochanteric bursitis, right hip: Secondary | ICD-10-CM | POA: Diagnosis not present

## 2015-12-07 DIAGNOSIS — M7061 Trochanteric bursitis, right hip: Secondary | ICD-10-CM | POA: Diagnosis not present

## 2015-12-07 DIAGNOSIS — M7631 Iliotibial band syndrome, right leg: Secondary | ICD-10-CM | POA: Diagnosis not present

## 2015-12-08 DIAGNOSIS — R079 Chest pain, unspecified: Secondary | ICD-10-CM | POA: Diagnosis not present

## 2015-12-08 DIAGNOSIS — M7061 Trochanteric bursitis, right hip: Secondary | ICD-10-CM | POA: Diagnosis not present

## 2015-12-08 DIAGNOSIS — M7631 Iliotibial band syndrome, right leg: Secondary | ICD-10-CM | POA: Diagnosis not present

## 2015-12-08 DIAGNOSIS — F419 Anxiety disorder, unspecified: Secondary | ICD-10-CM | POA: Diagnosis not present

## 2015-12-08 DIAGNOSIS — R9431 Abnormal electrocardiogram [ECG] [EKG]: Secondary | ICD-10-CM | POA: Diagnosis not present

## 2015-12-08 DIAGNOSIS — F430 Acute stress reaction: Secondary | ICD-10-CM | POA: Diagnosis not present

## 2015-12-12 DIAGNOSIS — M7061 Trochanteric bursitis, right hip: Secondary | ICD-10-CM | POA: Diagnosis not present

## 2015-12-12 DIAGNOSIS — M7631 Iliotibial band syndrome, right leg: Secondary | ICD-10-CM | POA: Diagnosis not present

## 2015-12-14 DIAGNOSIS — M7631 Iliotibial band syndrome, right leg: Secondary | ICD-10-CM | POA: Diagnosis not present

## 2015-12-14 DIAGNOSIS — M7061 Trochanteric bursitis, right hip: Secondary | ICD-10-CM | POA: Diagnosis not present

## 2015-12-26 DIAGNOSIS — F419 Anxiety disorder, unspecified: Secondary | ICD-10-CM | POA: Diagnosis not present

## 2015-12-26 DIAGNOSIS — F430 Acute stress reaction: Secondary | ICD-10-CM | POA: Diagnosis not present

## 2016-01-16 DIAGNOSIS — F411 Generalized anxiety disorder: Secondary | ICD-10-CM | POA: Diagnosis not present

## 2016-01-24 DIAGNOSIS — F411 Generalized anxiety disorder: Secondary | ICD-10-CM | POA: Diagnosis not present

## 2016-01-26 DIAGNOSIS — F419 Anxiety disorder, unspecified: Secondary | ICD-10-CM | POA: Diagnosis not present

## 2016-01-30 DIAGNOSIS — F411 Generalized anxiety disorder: Secondary | ICD-10-CM | POA: Diagnosis not present

## 2016-02-06 DIAGNOSIS — F411 Generalized anxiety disorder: Secondary | ICD-10-CM | POA: Diagnosis not present

## 2016-02-22 DIAGNOSIS — F411 Generalized anxiety disorder: Secondary | ICD-10-CM | POA: Diagnosis not present

## 2016-02-23 DIAGNOSIS — Z0000 Encounter for general adult medical examination without abnormal findings: Secondary | ICD-10-CM | POA: Diagnosis not present

## 2016-03-01 DIAGNOSIS — Z0000 Encounter for general adult medical examination without abnormal findings: Secondary | ICD-10-CM | POA: Diagnosis not present

## 2016-03-01 DIAGNOSIS — Z87891 Personal history of nicotine dependence: Secondary | ICD-10-CM | POA: Diagnosis not present

## 2016-05-27 HISTORY — PX: KNEE ARTHROSCOPY: SHX127

## 2016-06-20 DIAGNOSIS — Z713 Dietary counseling and surveillance: Secondary | ICD-10-CM | POA: Diagnosis not present

## 2016-07-30 DIAGNOSIS — Z1272 Encounter for screening for malignant neoplasm of vagina: Secondary | ICD-10-CM | POA: Diagnosis not present

## 2016-07-30 DIAGNOSIS — Z6824 Body mass index (BMI) 24.0-24.9, adult: Secondary | ICD-10-CM | POA: Diagnosis not present

## 2016-07-30 DIAGNOSIS — Z01419 Encounter for gynecological examination (general) (routine) without abnormal findings: Secondary | ICD-10-CM | POA: Diagnosis not present

## 2016-07-30 DIAGNOSIS — R21 Rash and other nonspecific skin eruption: Secondary | ICD-10-CM | POA: Diagnosis not present

## 2016-08-22 ENCOUNTER — Other Ambulatory Visit: Payer: Self-pay | Admitting: Orthopedic Surgery

## 2016-08-27 DIAGNOSIS — S83249A Other tear of medial meniscus, current injury, unspecified knee, initial encounter: Secondary | ICD-10-CM

## 2016-08-27 HISTORY — DX: Other tear of medial meniscus, current injury, unspecified knee, initial encounter: S83.249A

## 2016-08-30 ENCOUNTER — Encounter (HOSPITAL_BASED_OUTPATIENT_CLINIC_OR_DEPARTMENT_OTHER): Payer: Self-pay | Admitting: *Deleted

## 2016-09-03 ENCOUNTER — Other Ambulatory Visit: Payer: Self-pay | Admitting: Orthopedic Surgery

## 2016-09-04 NOTE — H&P (Signed)
[name] is an 51 y.o. female.   Chief Complaint: Left Knee Pain  HPI: [name] reports continued improvement in her right knee which underwent partial medial meniscectomy 05/25/16, under a workers comp claim.  When we saw her on 07/04/2016 that approved.  Evaluation of the left knee.  An MRI scan is been accomplished, which unfortunately shows a posterior horn medial meniscal tear, similar to the one she had on the right side.  The left knee pain is limiting her activities.  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  She is been taking meloxicam and is provided good pain relief on the right, less so on the left.    Past Medical History:  Diagnosis Date  . Allergy-induced asthma    no current med.  . Complication of anesthesia    states is hard to get to sleep and hard to wake up  . History of CVA (cerebrovascular accident) 2004   states no deficits  . Innocent heart murmur    per pt.  . Medial meniscus tear 08/2016   left knee  . Seasonal allergies     Past Surgical History:  Procedure Laterality Date  . ABDOMINAL HYSTERECTOMY    . CARDIAC CATHETERIZATION N/A 11/28/2015   Procedure: Left Heart Cath and Coronary Angiography;  Surgeon: Yates Decamp, MD;  Location: [facility] INVASIVE CV LAB;  Service: Cardiovascular;  Laterality: N/A;  . CYSTOSCOPY  09/29/2007  . KNEE ARTHROSCOPY Right 05/2016  . KNEE ARTHROSCOPY Left 1994  . LAPAROSCOPIC HYSTERECTOMY  09/29/2007   partial  . TUBAL LIGATION     x 2    History reviewed. No pertinent family history. Social History:  reports that she has never smoked. She has never used smokeless tobacco. She reports that she drinks alcohol. She reports that she does not use drugs.  Allergies:  Allergies  Allergen Reactions  . Tomato Anaphylaxis    No prescriptions prior to admission.    No results found for this or any previous visit (from the past 48 hour(s)). No results found.  Review of Systems   Constitutional: Negative.   HENT: Negative.        Sinus problems  Eyes: Negative.   Respiratory: Negative.   Cardiovascular:       Heart murmur  Gastrointestinal: Negative.   Genitourinary:       Kidney stones  Musculoskeletal: Positive for joint pain.  Skin: Negative.   Neurological: Positive for focal weakness.  Endo/Heme/Allergies: Negative.   Psychiatric/Behavioral: Negative.     Height 5\' 9"  (1.753 m), weight 72.6 kg (160 lb). Physical Exam  Constitutional: She is oriented to person, place, and time. She appears well-developed and well-nourished.  HENT:  Head: Normocephalic and atraumatic.  Eyes: Pupils are equal, round, and reactive to light.  Neck: Normal range of motion. Neck supple.  Cardiovascular: Intact distal pulses.   Respiratory: Effort normal.  Musculoskeletal:  The right knee has a full range of motion, no effusion.  The left knee is tender along the medial posterior medial joint line.  Flexion 130 exacerbates her pain.  Full extension exacerbates her pain slightly.  Neurological: She is alert and oriented to person, place, and time.  Skin: Skin is warm and dry.  Psychiatric: She has a normal mood and affect. Her behavior is normal. Judgment and thought content normal.     Assessment/Plan Assess: Left knee medial meniscal tear, work-related and documented by MRI scan.  Symptomatic now for over 4 months.  Plan: I'm asked permission for arthroscopic evaluation and treatment of the left knee, specifically to remove the meniscal tear and any chondromalacia.  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  I'll see her back at the time of surgical intervention.    Kathryn Spano R, PA-C 09/04/2016, 8:06 AM

## 2016-09-05 ENCOUNTER — Ambulatory Visit (HOSPITAL_BASED_OUTPATIENT_CLINIC_OR_DEPARTMENT_OTHER)
Admission: RE | Admit: 2016-09-05 | Discharge: 2016-09-05 | Disposition: A | Discharge status: Home / Self Care | Payer: Worker's Compensation | Source: Ambulatory Visit | Attending: Orthopedic Surgery | Admitting: Orthopedic Surgery

## 2016-09-05 ENCOUNTER — Ambulatory Visit (HOSPITAL_BASED_OUTPATIENT_CLINIC_OR_DEPARTMENT_OTHER): Payer: Worker's Compensation | Admitting: Anesthesiology

## 2016-09-05 ENCOUNTER — Encounter (HOSPITAL_BASED_OUTPATIENT_CLINIC_OR_DEPARTMENT_OTHER): Payer: Self-pay | Admitting: Anesthesiology

## 2016-09-05 ENCOUNTER — Encounter (HOSPITAL_BASED_OUTPATIENT_CLINIC_OR_DEPARTMENT_OTHER)
Admission: RE | Disposition: A | Discharge status: Home / Self Care | Payer: Self-pay | Source: Ambulatory Visit | Attending: Orthopedic Surgery

## 2016-09-05 DIAGNOSIS — S83242A Other tear of medial meniscus, current injury, left knee, initial encounter: Secondary | ICD-10-CM | POA: Diagnosis not present

## 2016-09-05 DIAGNOSIS — M25562 Pain in left knee: Secondary | ICD-10-CM | POA: Diagnosis present

## 2016-09-05 DIAGNOSIS — M6752 Plica syndrome, left knee: Secondary | ICD-10-CM

## 2016-09-05 DIAGNOSIS — Z8673 Personal history of transient ischemic attack (TIA), and cerebral infarction without residual deficits: Secondary | ICD-10-CM | POA: Insufficient documentation

## 2016-09-05 DIAGNOSIS — F419 Anxiety disorder, unspecified: Secondary | ICD-10-CM | POA: Diagnosis not present

## 2016-09-05 HISTORY — DX: Other complications of anesthesia, initial encounter: T88.59XA

## 2016-09-05 HISTORY — DX: Other tear of medial meniscus, current injury, unspecified knee, initial encounter: S83.249A

## 2016-09-05 HISTORY — DX: Unspecified asthma, uncomplicated: J45.909

## 2016-09-05 HISTORY — PX: KNEE ARTHROSCOPY: SHX127

## 2016-09-05 HISTORY — DX: Adverse effect of unspecified anesthetic, initial encounter: T41.45XA

## 2016-09-05 HISTORY — DX: Benign and innocent cardiac murmurs: R01.0

## 2016-09-05 HISTORY — DX: Personal history of transient ischemic attack (TIA), and cerebral infarction without residual deficits: Z86.73

## 2016-09-05 SURGERY — ARTHROSCOPY, KNEE
Anesthesia: General | Site: Knee | Laterality: Left

## 2016-09-05 MED ORDER — HYDROMORPHONE HCL 1 MG/ML IJ SOLN
INTRAMUSCULAR | Status: AC
  Filled 2016-09-05: qty 1

## 2016-09-05 MED ORDER — CEFAZOLIN SODIUM-DEXTROSE 2-4 GM/100ML-% IV SOLN
2.0000 g | INTRAVENOUS | Status: AC
  Administered 2016-09-05 (×2): 2 g via INTRAVENOUS

## 2016-09-05 MED ORDER — ONDANSETRON HCL 4 MG/2ML IJ SOLN
INTRAMUSCULAR | Status: AC
  Filled 2016-09-05: qty 2

## 2016-09-05 MED ORDER — FENTANYL CITRATE (PF) 100 MCG/2ML IJ SOLN
INTRAMUSCULAR | Status: DC | PRN
  Administered 2016-09-05: 50 ug via INTRAVENOUS

## 2016-09-05 MED ORDER — DEXAMETHASONE SODIUM PHOSPHATE 10 MG/ML IJ SOLN
INTRAMUSCULAR | Status: AC
  Filled 2016-09-05: qty 1

## 2016-09-05 MED ORDER — KETOROLAC TROMETHAMINE 30 MG/ML IJ SOLN: 30.0000 mg | Freq: Once | INTRAMUSCULAR | Status: DC | PRN

## 2016-09-05 MED ORDER — SCOPOLAMINE 1 MG/3DAYS TD PT72: 1.0000 | MEDICATED_PATCH | Freq: Once | TRANSDERMAL | Status: DC | PRN

## 2016-09-05 MED ORDER — BUPIVACAINE HCL (PF) 0.5 % IJ SOLN
INTRAMUSCULAR | Status: AC
  Filled 2016-09-05: qty 30

## 2016-09-05 MED ORDER — LIDOCAINE 2% (20 MG/ML) 5 ML SYRINGE
INTRAMUSCULAR | Status: AC
  Filled 2016-09-05: qty 5

## 2016-09-05 MED ORDER — PROPOFOL 10 MG/ML IV BOLUS
INTRAVENOUS | Status: AC
  Filled 2016-09-05: qty 20

## 2016-09-05 MED ORDER — PROPOFOL 10 MG/ML IV BOLUS
INTRAVENOUS | Status: DC | PRN
  Administered 2016-09-05: 150 mg via INTRAVENOUS

## 2016-09-05 MED ORDER — LIDOCAINE HCL (CARDIAC) 20 MG/ML IV SOLN
INTRAVENOUS | Status: DC | PRN
  Administered 2016-09-05: 30 mg via INTRAVENOUS

## 2016-09-05 MED ORDER — MIDAZOLAM HCL 2 MG/2ML IJ SOLN: 1.0000 mg | INTRAMUSCULAR | Status: DC | PRN

## 2016-09-05 MED ORDER — LACTATED RINGERS IV SOLN
INTRAVENOUS | Status: DC
  Administered 2016-09-05 (×2): via INTRAVENOUS

## 2016-09-05 MED ORDER — BUPIVACAINE HCL (PF) 0.5 % IJ SOLN
INTRAMUSCULAR | Status: DC | PRN
  Administered 2016-09-05: 20 mL

## 2016-09-05 MED ORDER — HYDROCODONE-ACETAMINOPHEN 5-325 MG PO TABS: 1.0000 | ORAL_TABLET | ORAL | 0 refills | Status: DC | PRN

## 2016-09-05 MED ORDER — CEFAZOLIN SODIUM-DEXTROSE 2-4 GM/100ML-% IV SOLN
INTRAVENOUS | Status: AC
  Filled 2016-09-05: qty 100

## 2016-09-05 MED ORDER — KETOROLAC TROMETHAMINE 30 MG/ML IJ SOLN
INTRAMUSCULAR | Status: DC | PRN
  Administered 2016-09-05: 30 mg via INTRAVENOUS

## 2016-09-05 MED ORDER — MIDAZOLAM HCL 5 MG/5ML IJ SOLN
INTRAMUSCULAR | Status: DC | PRN
  Administered 2016-09-05 (×2): 1 mg via INTRAVENOUS

## 2016-09-05 MED ORDER — DEXAMETHASONE SODIUM PHOSPHATE 4 MG/ML IJ SOLN
INTRAMUSCULAR | Status: DC | PRN
  Administered 2016-09-05: 10 mg via INTRAVENOUS

## 2016-09-05 MED ORDER — MIDAZOLAM HCL 2 MG/2ML IJ SOLN
INTRAMUSCULAR | Status: AC
  Filled 2016-09-05: qty 2

## 2016-09-05 MED ORDER — HYDROMORPHONE HCL 1 MG/ML IJ SOLN
0.2500 mg | INTRAMUSCULAR | Status: DC | PRN
  Administered 2016-09-05: 0.5 mg via INTRAVENOUS

## 2016-09-05 MED ORDER — PROMETHAZINE HCL 25 MG/ML IJ SOLN: 6.2500 mg | INTRAMUSCULAR | Status: DC | PRN

## 2016-09-05 MED ORDER — FENTANYL CITRATE (PF) 100 MCG/2ML IJ SOLN: 50.0000 ug | INTRAMUSCULAR | Status: DC | PRN

## 2016-09-05 MED ORDER — EPINEPHRINE 30 MG/30ML IJ SOLN
INTRAMUSCULAR | Status: AC
  Filled 2016-09-05: qty 1

## 2016-09-05 MED ORDER — CHLORHEXIDINE GLUCONATE 4 % EX LIQD: 60.0000 mL | Freq: Once | CUTANEOUS | Status: DC

## 2016-09-05 MED ORDER — FENTANYL CITRATE (PF) 100 MCG/2ML IJ SOLN
INTRAMUSCULAR | Status: AC
  Filled 2016-09-05: qty 2

## 2016-09-05 MED ORDER — KETOROLAC TROMETHAMINE 30 MG/ML IJ SOLN
INTRAMUSCULAR | Status: AC
  Filled 2016-09-05: qty 1

## 2016-09-05 MED ORDER — SODIUM CHLORIDE 0.9 % IR SOLN
Status: DC | PRN
  Administered 2016-09-05: 3000 mL

## 2016-09-05 SURGICAL SUPPLY — 41 items
BANDAGE ACE 6X5 VEL STRL LF (GAUZE/BANDAGES/DRESSINGS) ×2 IMPLANT
BLADE 4.2CUDA (BLADE) IMPLANT
BLADE CUTTER GATOR 3.5 (BLADE) ×2 IMPLANT
BLADE GREAT WHITE 4.2 (BLADE) IMPLANT
BNDG COHESIVE 6X5 TAN STRL LF (GAUZE/BANDAGES/DRESSINGS) ×2 IMPLANT
DRAPE ARTHROSCOPY W/POUCH 114 (DRAPES) ×2 IMPLANT
DURAPREP 26ML APPLICATOR (WOUND CARE) ×2 IMPLANT
ELECT MENISCUS 165MM 90D (ELECTRODE) IMPLANT
ELECT REM PT RETURN 9FT ADLT (ELECTROSURGICAL)
ELECTRODE REM PT RTRN 9FT ADLT (ELECTROSURGICAL) IMPLANT
GAUZE SPONGE 4X4 12PLY STRL (GAUZE/BANDAGES/DRESSINGS) ×2 IMPLANT
GAUZE XEROFORM 1X8 LF (GAUZE/BANDAGES/DRESSINGS) ×2 IMPLANT
GLOVE BIO SURGEON STRL SZ7.5 (GLOVE) ×2 IMPLANT
GLOVE BIO SURGEON STRL SZ8.5 (GLOVE) ×2 IMPLANT
GLOVE BIOGEL PI IND STRL 7.0 (GLOVE) ×2 IMPLANT
GLOVE BIOGEL PI IND STRL 8 (GLOVE) ×1 IMPLANT
GLOVE BIOGEL PI IND STRL 9 (GLOVE) ×1 IMPLANT
GLOVE BIOGEL PI INDICATOR 7.0 (GLOVE) ×2
GLOVE BIOGEL PI INDICATOR 8 (GLOVE) ×1
GLOVE BIOGEL PI INDICATOR 9 (GLOVE) ×1
GLOVE ECLIPSE 6.5 STRL STRAW (GLOVE) ×2 IMPLANT
GOWN STRL REUS W/ TWL LRG LVL3 (GOWN DISPOSABLE) ×2 IMPLANT
GOWN STRL REUS W/TWL LRG LVL3 (GOWN DISPOSABLE) ×2
GOWN STRL REUS W/TWL XL LVL3 (GOWN DISPOSABLE) ×2 IMPLANT
IV NS IRRIG 3000ML ARTHROMATIC (IV SOLUTION) ×2 IMPLANT
KNEE WRAP E Z 3 GEL PACK (MISCELLANEOUS) ×2 IMPLANT
MANIFOLD NEPTUNE II (INSTRUMENTS) ×2 IMPLANT
NDL SAFETY ECLIPSE 18X1.5 (NEEDLE) ×1 IMPLANT
NEEDLE HYPO 18GX1.5 SHARP (NEEDLE) ×1
PACK ARTHROSCOPY DSU (CUSTOM PROCEDURE TRAY) ×2 IMPLANT
PACK BASIN DAY SURGERY FS (CUSTOM PROCEDURE TRAY) ×2 IMPLANT
PAD ALCOHOL SWAB (MISCELLANEOUS) ×2 IMPLANT
PENCIL BUTTON HOLSTER BLD 10FT (ELECTRODE) IMPLANT
PROBE BIPOLAR ATHRO 135MM 90D (MISCELLANEOUS) IMPLANT
SET ARTHROSCOPY TUBING (MISCELLANEOUS) ×1
SET ARTHROSCOPY TUBING LN (MISCELLANEOUS) ×1 IMPLANT
SLEEVE SCD COMPRESS KNEE MED (MISCELLANEOUS) IMPLANT
SYR 3ML 18GX1 1/2 (SYRINGE) IMPLANT
SYR 5ML LL (SYRINGE) ×2 IMPLANT
TOWEL OR 17X24 6PK STRL BLUE (TOWEL DISPOSABLE) ×2 IMPLANT
WATER STERILE IRR 1000ML POUR (IV SOLUTION) ×2 IMPLANT

## 2016-09-05 NOTE — Anesthesia Procedure Notes (Signed)
Procedure Name: LMA Insertion Date/Time: 09/05/2016 11:52 AM Performed by: Genevieve NorlanderLINKA, [name] Pre-anesthesia Checklist: Patient identified, Emergency Drugs available, Suction available, Patient being monitored and Timeout performed Patient Re-evaluated:Patient Re-evaluated prior to inductionOxygen Delivery Method: Circle system utilized Preoxygenation: Pre-oxygenation with 100% oxygen Intubation Type: IV induction Ventilation: Mask ventilation without difficulty LMA: LMA inserted LMA Size: 4.0 Number of attempts: 1 Airway Equipment and Method: Bite block Placement Confirmation: positive ETCO2 Tube secured with: Tape Dental Injury: Teeth and Oropharynx as per pre-operative assessment

## 2016-09-05 NOTE — Anesthesia Postprocedure Evaluation (Signed)
Anesthesia Post Note  Patient: Wilburn CorneliaCynthia W Huwe  Procedure(s) Performed: Procedure(s) (LRB): ARTHROSCOPY KNEE WITH PLICAECTOMY (Left)  Patient location during evaluation: PACU Anesthesia Type: General Level of consciousness: awake and alert Pain management: pain level controlled Vital Signs Assessment: post-procedure vital signs reviewed and stable Respiratory status: spontaneous breathing, nonlabored ventilation, respiratory function stable and patient connected to nasal cannula oxygen Cardiovascular status: blood pressure returned to baseline and stable Postop Assessment: no signs of nausea or vomiting Anesthetic complications: no       Last Vitals:  Vitals:   09/05/16 1245 09/05/16 1315  BP: (!) 158/89 138/82  Pulse: 64 65  Resp: 12 18  Temp:  36.6 C    Last Pain:  Vitals:   09/05/16 1315  TempSrc:   PainSc: 0-No pain                 Kennieth RadFitzgerald, Aimee Timmons E

## 2016-09-05 NOTE — Anesthesia Preprocedure Evaluation (Addendum)
Anesthesia Evaluation  Patient identified by MRN, date of birth, ID band Patient awake    Reviewed: Allergy & Precautions, NPO status , Patient's Chart, lab work & pertinent test results  Airway Mallampati: II  TM Distance: >3 FB Neck ROM: Full    Dental  (+) Dental Advisory Given   Pulmonary neg pulmonary ROS,    breath sounds clear to auscultation       Cardiovascular negative cardio ROS   Rhythm:Regular Rate:Normal  Cardiac catheterization performed on 11/28/2015 Impression:  The left ventricular systolic function is normal.  normal coronary arteries   Neuro/Psych Anxiety negative neurological ROS     GI/Hepatic negative GI ROS, Neg liver ROS,   Endo/Other  negative endocrine ROS  Renal/GU negative Renal ROS     Musculoskeletal negative musculoskeletal ROS (+)   Abdominal   Peds  Hematology negative hematology ROS (+)   Anesthesia Other Findings   Reproductive/Obstetrics                           Anesthesia Physical Anesthesia Plan  ASA: I  Anesthesia Plan: General   Post-op Pain Management:    Induction: Intravenous  Airway Management Planned: LMA  Additional Equipment:   Intra-op Plan:   Post-operative Plan: Extubation in OR  Informed Consent: I have reviewed the patients History and Physical, chart, labs and discussed the procedure including the risks, benefits and alternatives for the proposed anesthesia with the patient or authorized representative who has indicated his/her understanding and acceptance.   Dental advisory given  Plan Discussed with: CRNA  Anesthesia Plan Comments:        Anesthesia Quick Evaluation

## 2016-09-05 NOTE — Discharge Instructions (Signed)

## 2016-09-05 NOTE — Transfer of Care (Signed)
Immediate Anesthesia Transfer of Care Note  Patient: [name]  Procedure(s) Performed: Procedure(s): ARTHROSCOPY KNEE WITH PLICAECTOMY (Left)  Patient Location: PACU  Anesthesia Type:General  Level of Consciousness: awake and patient cooperative  Airway & Oxygen Therapy: Patient Spontanous Breathing and Patient connected to face mask oxygen  Post-op Assessment: Report given to RN and Post -op Vital signs reviewed and stable  Post vital signs: Reviewed and stable  Last Vitals:  Vitals:   09/05/16 0835  BP: 126/86  Pulse: 68  Temp: 36.9 C    Last Pain:  Vitals:   09/05/16 0835  TempSrc: Oral  PainSc: 3          Complications: No apparent anesthesia complications

## 2016-09-05 NOTE — Op Note (Signed)
Pre-Op Dx: Left knee medial meniscal tear  Postop Dx: Left knee inflamed plica   Procedure: Removal of inflamed plica left knee  Surgeon: Feliberto GottronFrank J. Turner Danielsowan M.D.  Assist: Tomi LikensEric K. Gaylene BrooksPhillips PA-C  (present throughout entire procedure and necessary for timely completion of the procedure) Anes: General LMA  EBL: Minimal  Fluids: 800 cc   Indications: Catching popping and pain to the medial aspect of the left knee similar to the pain she had in the right knee that underwent arthroscopic meniscectomy a few months ago.. Pt has failed conservative treatment with anti-inflammatory medicines, physical therapy, and modified activites but did get good temporarily from an intra-articular cortisone injection. Pain has recurred and patient desires elective arthroscopic evaluation and treatment of knee. Risks and benefits of surgery have been discussed and questions answered.  Procedure: Patient identified by arm band and taken to the operating room at the day surgery Center. The appropriate anesthetic monitors were attached, and General LMA anesthesia was induced without difficulty. Lateral post was applied to the table and the lower extremity was prepped and draped in usual sterile fashion from the ankle to the midthigh. Time out procedure was performed. We began the operation by making standard inferior lateral and inferior medial peripatellar portals with a #11 blade allowing introduction of the arthroscope through the inferior lateral portal and the out flow to the inferior medial portal. Pump pressure was set at 100 mmHg and diagnostic arthroscopy  revealed a normal patellofemoral joint and inflamed superomedial plica extending to inferomedial. This was removed with a 3.5 mm Gator sucker shaver. Moving into the medial compartment the articular and meniscal cartilages were pristine and were thoroughly probed the anterior cruciate ligament and PCL are intact. On the lateral compartment the articular and meniscal  cartilages were also pristine. The gutters were cleared medially and laterally.. The knee was irrigated out normal saline solution. A dressing of xerofoam 4 x 4 dressing sponges, web roll and an Ace wrap was applied. The patient was awakened extubated and taken to the recovery without difficulty.    Signed: Nestor LewandowskyFrank J Antonios Ostrow, MD

## 2016-09-05 NOTE — Interval H&P Note (Signed)
History and Physical Interval Note:  09/05/2016 11:42 AM  [name]  has presented today for surgery, with the diagnosis of LEFT KNEE MEDIAL MENISCAL TEAR  The various methods of treatment have been discussed with the patient and family. After consideration of risks, benefits and other options for treatment, the patient has consented to  Procedure(s): ARTHROSCOPY KNEE (Left) as a surgical intervention .  The patient's history has been reviewed, patient examined, no change in status, stable for surgery.  I have reviewed the patient's chart and labs.  Questions were answered to the patient's satisfaction.     Nestor LewandowskyOWAN,Drayke Grabel J

## 2016-09-06 ENCOUNTER — Encounter (HOSPITAL_BASED_OUTPATIENT_CLINIC_OR_DEPARTMENT_OTHER): Payer: Self-pay | Admitting: Orthopedic Surgery

## 2016-09-07 NOTE — Addendum Note (Signed)
Addendum  created 09/07/16 [id number] by Lance CoonWesley Laverta Harnisch, CRNA   Charge Capture section accepted

## 2016-09-12 ENCOUNTER — Ambulatory Visit (HOSPITAL_COMMUNITY)
Admission: RE | Admit: 2016-09-12 | Discharge: 2016-09-12 | Disposition: A | Discharge status: Home / Self Care | Payer: Worker's Compensation | Source: Ambulatory Visit | Attending: Orthopedic Surgery | Admitting: Orthopedic Surgery

## 2016-09-12 ENCOUNTER — Other Ambulatory Visit (HOSPITAL_COMMUNITY): Payer: Self-pay | Admitting: Orthopedic Surgery

## 2016-09-12 DIAGNOSIS — M7989 Other specified soft tissue disorders: Secondary | ICD-10-CM

## 2016-09-12 DIAGNOSIS — M79605 Pain in left leg: Secondary | ICD-10-CM | POA: Diagnosis not present

## 2016-09-12 NOTE — Progress Notes (Signed)
*  Preliminary Results* Left lower extremity venous duplex completed. Left lower extremity is negative for deep vein thrombosis. There is no evidence of left Baker's cyst.  09/12/2016 11:23 AM  Gertie FeyMichelle Esmirna Ravan, BS, RVT, RDCS, RDMS

## 2016-09-19 ENCOUNTER — Encounter (HOSPITAL_COMMUNITY): Payer: Self-pay | Admitting: Emergency Medicine

## 2016-09-19 ENCOUNTER — Ambulatory Visit (HOSPITAL_COMMUNITY)
Admission: EM | Admit: 2016-09-19 | Discharge: 2016-09-19 | Disposition: A | Discharge status: Home / Self Care | Payer: Federal, State, Local not specified - PPO | Attending: Emergency Medicine | Admitting: Emergency Medicine

## 2016-09-19 DIAGNOSIS — J0191 Acute recurrent sinusitis, unspecified: Secondary | ICD-10-CM | POA: Diagnosis not present

## 2016-09-19 DIAGNOSIS — H9203 Otalgia, bilateral: Secondary | ICD-10-CM | POA: Diagnosis not present

## 2016-09-19 DIAGNOSIS — T700XXA Otitic barotrauma, initial encounter: Secondary | ICD-10-CM | POA: Diagnosis not present

## 2016-09-19 DIAGNOSIS — H6983 Other specified disorders of Eustachian tube, bilateral: Secondary | ICD-10-CM

## 2016-09-19 MED ORDER — PREDNISONE 50 MG PO TABS: ORAL_TABLET | ORAL | 0 refills | Status: DC

## 2016-09-19 MED ORDER — AMOXICILLIN 500 MG PO CAPS: 1000.0000 mg | ORAL_CAPSULE | Freq: Two times a day (BID) | ORAL | 0 refills | Status: DC

## 2016-09-19 NOTE — ED Triage Notes (Signed)
[facility] a [facility] bilateral ear pain and sinus pressure x 2 weeks.

## 2016-09-19 NOTE — Discharge Instructions (Signed)
Take the decongestant as directed on a fairly regular basis as needed for symptoms. Also drink plenty of fluids and stay well-hydrated. Use saline nasal spray frequently. Recommend taking an antihistamine such as Zyrtec or Allegra. You may take. Zyrtec-D which includes both the antihistamine and decongestant. When taking the prednisone sure to take it with food to protect your stomach. Follow-up with your primary care doctor as needed.

## 2016-09-19 NOTE — ED Provider Notes (Signed)
CSN: [id number]     Arrival date & time 09/19/16  1316 History   First MD Initiated Contact with Patient 09/19/16 1334     Chief Complaint  Patient presents with  . Otalgia  . Facial Pain   (Consider location/radiation/quality/duration/timing/severity/associated sxs/prior Treatment) 51 year old female complaining of moderate to severe sinus congestion for a month. She states she took a flight last month and was crying due to ear and facial pain and pressure. She took some OTC medications which helped for a while until last night she developed the same symptoms of ear pain and pain around the eyes and paranasal sinuses. She took a dose of Advil sinus last night without much relief. Denies fevers or chills. Vital signs are within normal limits. She is afebrile. History of recurrent sinusitis throughout the year, partly due to allergies and possibly other due to infection.      Past Medical History:  Diagnosis Date  . Allergy-induced asthma    no current med.  . Complication of anesthesia    states is hard to get to sleep and hard to wake up  . History of CVA (cerebrovascular accident) 2004   states no deficits  . Innocent heart murmur    per pt.  . Medial meniscus tear 08/2016   left knee  . Seasonal allergies    Past Surgical History:  Procedure Laterality Date  . ABDOMINAL HYSTERECTOMY    . CARDIAC CATHETERIZATION N/A 11/28/2015   Procedure: Left Heart Cath and Coronary Angiography;  Surgeon: Yates Decamp, MD;  Location: [facility] INVASIVE CV LAB;  Service: Cardiovascular;  Laterality: N/A;  . CYSTOSCOPY  09/29/2007  . KNEE ARTHROSCOPY Right 05/2016  . KNEE ARTHROSCOPY Left 1994  . KNEE ARTHROSCOPY Left 09/05/2016   Procedure: ARTHROSCOPY KNEE WITH PLICAECTOMY;  Surgeon: Gean Birchwood, MD;  Location: [facility];  Service: Orthopedics;  Laterality: Left;  . LAPAROSCOPIC HYSTERECTOMY  09/29/2007   partial  . TUBAL LIGATION     x 2   History reviewed. No pertinent family  history. Social History  Substance Use Topics  . Smoking status: Never Smoker  . Smokeless tobacco: Never Used  . Alcohol use 0.0 oz/week     Comment: occasionally   OB History    No data available     Review of Systems  Constitutional: Negative.  Negative for activity change, appetite change, chills, fatigue and fever.  HENT: Positive for congestion, ear pain, postnasal drip and rhinorrhea. Negative for facial swelling and sore throat.   Eyes: Negative.   Respiratory: Negative.   Cardiovascular: Negative.   Musculoskeletal: Negative for neck pain and neck stiffness.  Skin: Negative for pallor and rash.  Neurological: Negative.   All other systems reviewed and are negative.   Allergies  Tomato  Home Medications   Prior to Admission medications   Medication Sig Start Date End Date Taking? Authorizing Provider  Calcium Carbonate (CALTRATE 600 PO) Take by mouth.   Yes Historical Provider, MD  cetirizine (ZYRTEC) 10 MG tablet Take 10 mg by mouth daily.   Yes Historical Provider, MD  meloxicam (MOBIC) 7.5 MG tablet Take 7.5 mg by mouth daily.   Yes Historical Provider, MD  vitamin C (ASCORBIC ACID) 500 MG tablet Take 500 mg by mouth daily.   Yes Historical Provider, MD  amoxicillin (AMOXIL) 500 MG capsule Take 2 capsules (1,000 mg total) by mouth 2 (two) times daily. 09/19/16   Hayden Rasmussen, NP  predniSONE (DELTASONE) 50 MG tablet 1 tab po daily  for 6 days. Take with food. 09/19/16   Hayden Rasmussenavid Leelan Rajewski, NP   Meds Ordered and Administered this Visit  Medications - No data to display  BP 141/79 (BP Location: Right Arm)   Pulse 76   Temp 98 F (36.7 C) (Oral)   Resp 18   SpO2 100%  No data found.   Physical Exam  Constitutional: She is oriented to person, place, and time. She appears well-developed and well-nourished. No distress.  HENT:  Head: Normocephalic and atraumatic.  Mouth/Throat: No oropharyngeal exudate.  Bilateral TMs are mildly retracted otherwise normal. Oropharynx  with light cobblestoning and moderate amount of clear PND. Tenderness across the forehead and paranasal sinuses.  Eyes: EOM are normal.  Neck: Normal range of motion. Neck supple.  Cardiovascular: Normal rate, regular rhythm and normal heart sounds.   Pulmonary/Chest: Effort normal and breath sounds normal. No respiratory distress.  Musculoskeletal: She exhibits no edema.  Lymphadenopathy:    She has no cervical adenopathy.  Neurological: She is alert and oriented to person, place, and time.  Skin: Skin is warm.  Psychiatric: She has a normal mood and affect.  Nursing note and vitals reviewed.   Urgent Care Course     Procedures (including critical care time)  Labs Review Labs Reviewed - No data to display  Imaging Review No results found.   Visual Acuity Review  Right Eye Distance:   Left Eye Distance:   Bilateral Distance:    Right Eye Near:   Left Eye Near:    Bilateral Near:         MDM   1. Acute recurrent sinusitis, unspecified location   2. Otalgia of both ears   3. ETD (Eustachian tube dysfunction), bilateral   4. Otitic barotrauma, initial encounter    Take the decongestant as directed on a fairly regular basis as needed for symptoms. Also drink plenty of fluids and stay well-hydrated. Use saline nasal spray frequently. Recommend taking an antihistamine such as Zyrtec or Allegra. You may take. Zyrtec-D which includes both the antihistamine and decongestant. When taking the prednisone sure to take it with food to protect her stomach. Follow-up with her primary care doctor as needed. Meds ordered this encounter  Medications  . predniSONE (DELTASONE) 50 MG tablet    Sig: 1 tab po daily for 6 days. Take with food.    Dispense:  6 tablet    Refill:  0    Order Specific Question:   Supervising Provider    Answer:   Charm RingsHONIG, ERIN J [id number]  . amoxicillin (AMOXIL) 500 MG capsule    Sig: Take 2 capsules (1,000 mg total) by mouth 2 (two) times daily.     Dispense:  40 capsule    Refill:  0    Order Specific Question:   Supervising Provider    Answer:   Charm RingsHONIG, ERIN J [[id number]]       Hayden Rasmussenavid Gwenlyn Hottinger, NP 09/19/16 1415

## 2016-09-26 DIAGNOSIS — H9201 Otalgia, right ear: Secondary | ICD-10-CM | POA: Diagnosis not present

## 2016-09-26 DIAGNOSIS — Z87891 Personal history of nicotine dependence: Secondary | ICD-10-CM | POA: Diagnosis not present

## 2016-09-26 DIAGNOSIS — J309 Allergic rhinitis, unspecified: Secondary | ICD-10-CM | POA: Diagnosis not present

## 2016-10-06 ENCOUNTER — Encounter (HOSPITAL_COMMUNITY): Payer: Self-pay

## 2016-10-06 ENCOUNTER — Emergency Department (HOSPITAL_COMMUNITY)
Admission: EM | Admit: 2016-10-06 | Discharge: 2016-10-06 | Disposition: A | Discharge status: Home / Self Care | Payer: Federal, State, Local not specified - PPO | Attending: Emergency Medicine | Admitting: Emergency Medicine

## 2016-10-06 ENCOUNTER — Emergency Department (HOSPITAL_COMMUNITY): Payer: Federal, State, Local not specified - PPO

## 2016-10-06 DIAGNOSIS — J0100 Acute maxillary sinusitis, unspecified: Secondary | ICD-10-CM | POA: Insufficient documentation

## 2016-10-06 DIAGNOSIS — Z8673 Personal history of transient ischemic attack (TIA), and cerebral infarction without residual deficits: Secondary | ICD-10-CM | POA: Diagnosis not present

## 2016-10-06 DIAGNOSIS — R519 Headache, unspecified: Secondary | ICD-10-CM

## 2016-10-06 DIAGNOSIS — J328 Other chronic sinusitis: Secondary | ICD-10-CM | POA: Diagnosis not present

## 2016-10-06 DIAGNOSIS — Z79899 Other long term (current) drug therapy: Secondary | ICD-10-CM | POA: Diagnosis not present

## 2016-10-06 DIAGNOSIS — R51 Headache: Secondary | ICD-10-CM

## 2016-10-06 LAB — CBC WITH DIFFERENTIAL/PLATELET
Basophils Absolute: 0 10*3/uL (ref 0.0–0.1)
Basophils Relative: 0 %
Eosinophils Absolute: 0 10*3/uL (ref 0.0–0.7)
Eosinophils Relative: 0 %
HCT: 38.1 % (ref 36.0–46.0)
Hemoglobin: 12.2 g/dL (ref 12.0–15.0)
Lymphocytes Relative: 36 %
Lymphs Abs: 1.7 10*3/uL (ref 0.7–4.0)
MCH: 28.4 pg (ref 26.0–34.0)
MCHC: 32 g/dL (ref 30.0–36.0)
MCV: 88.8 fL (ref 78.0–100.0)
Monocytes Absolute: 0.3 10*3/uL (ref 0.1–1.0)
Monocytes Relative: 7 %
Neutro Abs: 2.7 10*3/uL (ref 1.7–7.7)
Neutrophils Relative %: 57 %
Platelets: 277 10*3/uL (ref 150–400)
RBC: 4.29 MIL/uL (ref 3.87–5.11)
RDW: 14.6 % (ref 11.5–15.5)
WBC: 4.8 10*3/uL (ref 4.0–10.5)

## 2016-10-06 LAB — BASIC METABOLIC PANEL
Anion gap: 8 (ref 5–15)
BUN: 8 mg/dL (ref 6–20)
CO2: 24 mmol/L (ref 22–32)
Calcium: 9.2 mg/dL (ref 8.9–10.3)
Chloride: 105 mmol/L (ref 101–111)
Creatinine, Ser: 1 mg/dL (ref 0.44–1.00)
GFR calc Af Amer: >60 mL/min (ref >60)
GFR calc non Af Amer: >60 mL/min (ref >60)
Glucose, Bld: 84 mg/dL (ref 65–99)
Potassium: 3.7 mmol/L (ref 3.5–5.1)
Sodium: 137 mmol/L (ref 135–145)

## 2016-10-06 MED ORDER — OXYCODONE-ACETAMINOPHEN 5-325 MG PO TABS: 1.0000 | ORAL_TABLET | ORAL | 0 refills | Status: DC | PRN

## 2016-10-06 MED ORDER — KETOROLAC TROMETHAMINE 30 MG/ML IJ SOLN
30.0000 mg | Freq: Once | INTRAMUSCULAR | Status: AC
  Administered 2016-10-06: 30 mg via INTRAVENOUS
  Filled 2016-10-06: qty 1

## 2016-10-06 MED ORDER — CLARITHROMYCIN 500 MG PO TABS: 500.0000 mg | ORAL_TABLET | Freq: Two times a day (BID) | ORAL | 0 refills | Status: DC

## 2016-10-06 MED ORDER — SODIUM CHLORIDE 0.9 % IV BOLUS (SEPSIS)
1000.0000 mL | Freq: Once | INTRAVENOUS | Status: AC
  Administered 2016-10-06: 1000 mL via INTRAVENOUS

## 2016-10-06 MED ORDER — OXYCODONE-ACETAMINOPHEN 5-325 MG PO TABS
1.0000 | ORAL_TABLET | Freq: Once | ORAL | Status: AC
  Administered 2016-10-06: 1 via ORAL
  Filled 2016-10-06: qty 1

## 2016-10-06 MED ORDER — FLUTICASONE PROPIONATE 50 MCG/ACT NA SUSP: 2.0000 | Freq: Every day | NASAL | 0 refills | Status: DC

## 2016-10-06 NOTE — ED Provider Notes (Signed)
[facility]-EMERGENCY DEPT Provider Note   CSN: [id number] Arrival date & time: 10/06/16  1014     History   Chief Complaint No chief complaint on file.   HPI [name] is a 51 y.o. female.  She complains of headache for 3 weeks associated with bilateral ear pain, left greater than right. Is been seen and evaluated by physicians, and treated with prednisone and amoxicillin most recently. She feels like she is having chills, and has general achiness. She states that the medicines that she has been taking have not helped. She has not had this problem previously. She denies chest pain, focal weakness, paresthesias, blurred vision, or trouble walking. There are no other known modifying factors.  HPI  Past Medical History:  Diagnosis Date  . Allergy-induced asthma    no current med.  . Complication of anesthesia    states is hard to get to sleep and hard to wake up  . History of CVA (cerebrovascular accident) 2004   states no deficits  . Innocent heart murmur    per pt.  . Medial meniscus tear 08/2016   left knee  . Seasonal allergies     Patient Active Problem List   Diagnosis Date Noted  . Panic attack 11/29/2015  . Anxiety state 11/29/2015  . Chest pain 11/28/2015    Past Surgical History:  Procedure Laterality Date  . ABDOMINAL HYSTERECTOMY    . CARDIAC CATHETERIZATION N/A 11/28/2015   Procedure: Left Heart Cath and Coronary Angiography;  Surgeon: Yates DecampJay Ganji, MD;  [facility];  Service: Cardiovascular;  Laterality: N/A;  . CYSTOSCOPY  09/29/2007  . KNEE ARTHROSCOPY Right 05/2016  . KNEE ARTHROSCOPY Left 1994  . KNEE ARTHROSCOPY Left 09/05/2016   Procedure: ARTHROSCOPY KNEE WITH PLICAECTOMY;  Surgeon: Gean BirchwoodFrank Rowan, MD;  Location: [facility];  Service: Orthopedics;  Laterality: Left;  . LAPAROSCOPIC HYSTERECTOMY  09/29/2007   partial  . TUBAL LIGATION     x 2    OB History    No data available       Home Medications     Prior to Admission medications   Medication Sig Start Date End Date Taking? Authorizing Provider  Calcium Carbonate (CALTRATE 600 PO) Take 1 tablet by mouth daily.    Yes Historical Provider, MD  cetirizine (ZYRTEC) 10 MG tablet Take 10 mg by mouth daily.   Yes Historical Provider, MD  meloxicam (MOBIC) 15 MG tablet Take 15 mg by mouth daily. Every Wed / Fri 09/11/16  Yes Historical Provider, MD  vitamin C (ASCORBIC ACID) 500 MG tablet Take 500 mg by mouth daily.   Yes Historical Provider, MD  clarithromycin (BIAXIN) 500 MG tablet Take 1 tablet (500 mg total) by mouth 2 (two) times daily. 10/06/16   Mancel BaleElliott Tarra Pence, MD  fluticasone (FLONASE) 50 MCG/ACT nasal spray Place 2 sprays into both nostrils daily. 10/06/16   Mancel BaleElliott Laren Whaling, MD  oxyCODONE-acetaminophen (PERCOCET) 5-325 MG tablet Take 1 tablet by mouth every 4 (four) hours as needed for severe pain. 10/06/16   Mancel BaleElliott Merriel Zinger, MD    Family History No family history on file.  Social History Social History  Substance Use Topics  . Smoking status: Never Smoker  . Smokeless tobacco: Never Used  . Alcohol use 0.0 oz/week     Comment: occasionally     Allergies   Tomato   Review of Systems Review of Systems  All other systems reviewed and are negative.    Physical Exam Updated  Vital Signs BP 101/61   Pulse 72   Temp 99.4 F (37.4 C) (Oral)   Resp 20   SpO2 93%   Physical Exam  Constitutional: She is oriented to person, place, and time. She appears well-developed and well-nourished.  HENT:  Head: Normocephalic and atraumatic.  Right Ear: External ear normal.  Left Ear: External ear normal.  Tympanic membranes are normal bilaterally. External auditory canals are normal.  Eyes: Conjunctivae and EOM are normal. Pupils are equal, round, and reactive to light.  Neck: Normal range of motion and phonation normal. Neck supple.  There is no meningismus. She is able to touch her chin to her chest without difficulty.   Cardiovascular: Normal rate and regular rhythm.   Pulmonary/Chest: Effort normal and breath sounds normal. She exhibits no tenderness.  Abdominal: Soft. She exhibits no distension. There is no tenderness. There is no guarding.  Musculoskeletal: Normal range of motion.  Normal strength. Arms and legs bilaterally.  Neurological: She is alert and oriented to person, place, and time. No cranial nerve deficit. She exhibits normal muscle tone. Coordination normal.  No dysarthria, aphasia or nystagmus.  Skin: Skin is warm and dry.  Psychiatric: She has a normal mood and affect. Her behavior is normal. Judgment and thought content normal.  Nursing note and vitals reviewed.    ED Treatments / Results  Labs (all labs ordered are listed, but only abnormal results are displayed) Labs Reviewed  BASIC METABOLIC PANEL  CBC WITH DIFFERENTIAL/PLATELET    EKG  EKG Interpretation None       Radiology Ct Head Wo Contrast  Result Date: 10/06/2016 CLINICAL DATA:  Headache EXAM: CT HEAD WITHOUT CONTRAST CT MAXILLOFACIAL WITHOUT CONTRAST TECHNIQUE: Multidetector CT imaging of the head and maxillofacial structures were performed using the standard protocol without intravenous contrast. Multiplanar CT image reconstructions of the maxillofacial structures were also generated. COMPARISON:  None. FINDINGS: CT HEAD FINDINGS Brain: No acute intracranial abnormality. Specifically, no hemorrhage, hydrocephalus, mass lesion, acute infarction, or significant intracranial injury. Vascular: No hyperdense vessel or unexpected calcification. Skull: No acute calvarial abnormality. Other: None CT MAXILLOFACIAL FINDINGS Osseous: No fracture or mandibular dislocation. No destructive process. Orbits: Negative. No traumatic or inflammatory finding. Sinuses: Slight mucosal thickening in the ethmoid air cells and maxillary sinuses. Short air-fluid level in the left maxillary sinus. Mastoid air cells are clear. Soft tissues:  Negative IMPRESSION: No acute intracranial abnormality. Acute on chronic sinusitis. No acute bony abnormality in the face. Electronically Signed   By: Charlett Nose M.D.   On: 10/06/2016 14:09   Ct Maxillofacial Wo Contrast  Result Date: 10/06/2016 CLINICAL DATA:  Headache EXAM: CT HEAD WITHOUT CONTRAST CT MAXILLOFACIAL WITHOUT CONTRAST TECHNIQUE: Multidetector CT imaging of the head and maxillofacial structures were performed using the standard protocol without intravenous contrast. Multiplanar CT image reconstructions of the maxillofacial structures were also generated. COMPARISON:  None. FINDINGS: CT HEAD FINDINGS Brain: No acute intracranial abnormality. Specifically, no hemorrhage, hydrocephalus, mass lesion, acute infarction, or significant intracranial injury. Vascular: No hyperdense vessel or unexpected calcification. Skull: No acute calvarial abnormality. Other: None CT MAXILLOFACIAL FINDINGS Osseous: No fracture or mandibular dislocation. No destructive process. Orbits: Negative. No traumatic or inflammatory finding. Sinuses: Slight mucosal thickening in the ethmoid air cells and maxillary sinuses. Short air-fluid level in the left maxillary sinus. Mastoid air cells are clear. Soft tissues: Negative IMPRESSION: No acute intracranial abnormality. Acute on chronic sinusitis. No acute bony abnormality in the face. Electronically Signed   By: Charlett Nose M.D.  On: 10/06/2016 14:09    Procedures Procedures (including critical care time)  Medications Ordered in ED Medications  sodium chloride 0.9 % bolus 1,000 mL (1,000 mLs Intravenous New Bag/Given 10/06/16 1336)  ketorolac (TORADOL) 30 MG/ML injection 30 mg (30 mg Intravenous Given 10/06/16 1336)  oxyCODONE-acetaminophen (PERCOCET/ROXICET) 5-325 MG per tablet 1 tablet (1 tablet Oral Given 10/06/16 1556)     Initial Impression / Assessment and Plan / ED Course  I have reviewed the triage vital signs and the nursing notes.  Pertinent labs &  imaging results that were available during my care of the patient were reviewed by me and considered in my medical decision making (see chart for details).  Clinical Course as of Oct 06 1636  Sat Oct 06, 2016  1526 No Intracranial abnormality CT Head Wo Contrast [EW]  1527 Acute on Chronic Sinus disease CT MAXILLOFACIAL WO CONTRAST [EW]    Clinical Course User Index [EW] Mancel Bale, MD    Medications  sodium chloride 0.9 % bolus 1,000 mL (1,000 mLs Intravenous New Bag/Given 10/06/16 1336)  ketorolac (TORADOL) 30 MG/ML injection 30 mg (30 mg Intravenous Given 10/06/16 1336)  oxyCODONE-acetaminophen (PERCOCET/ROXICET) 5-325 MG per tablet 1 tablet (1 tablet Oral Given 10/06/16 1556)    Patient Vitals for the past 24 hrs:  BP Temp Temp src Pulse Resp SpO2  10/06/16 1630 101/61 - - 72 - 93 %  10/06/16 1615 103/67 - - 68 - 97 %  10/06/16 1600 99/63 - - 67 - 99 %  10/06/16 1545 103/59 - - 72 - 96 %  10/06/16 1530 99/61 - - 74 - 99 %  10/06/16 1515 101/67 - - 76 - 95 %  10/06/16 1500 105/70 - - 74 - 95 %  10/06/16 1444 98/57 99.4 F (37.4 C) Oral 73 20 98 %  10/06/16 1315 109/73 - - 103 - 98 %  10/06/16 1300 112/74 - - 82 - 97 %  10/06/16 1245 113/75 - - 86 - 97 %  10/06/16 1215 111/76 - - 96 - 100 %  10/06/16 1034 117/66 99.7 F (37.6 C) Oral 95 20 100 %    4:35 PM Reevaluation with update and discussion. After initial assessment and treatment, an updated evaluation reveals patient is comfortable at this time. There are no further complaints. Findings discussed with the patient, all questions answered. Carsten Carstarphen L    Final Clinical Impressions(s) / ED Diagnoses   Final diagnoses:  Headache  Acute non-recurrent maxillary sinusitis    Evaluation is consistent with acute sinusitis, primarily left maxillary. Doubt severe sepsis, metabolic instability or impending vascular collapse.  Nursing Notes Reviewed/ Care Coordinated Applicable Imaging Reviewed Interpretation of  Laboratory Data incorporated into ED treatment  The patient appears reasonably screened and/or stabilized for discharge and I doubt any other medical condition or other [facility] requiring further screening, evaluation, or treatment in the ED at this time prior to discharge.  Plan: Home Medications- use Afrin nasal spray twice a day for 1 week while taking Flonase nasal spray.; Home Treatments- rest, fluids; return here if the recommended treatment, does not improve the symptoms; Recommended follow up- PCP, when necessary   New Prescriptions New Prescriptions   CLARITHROMYCIN (BIAXIN) 500 MG TABLET    Take 1 tablet (500 mg total) by mouth 2 (two) times daily.   FLUTICASONE (FLONASE) 50 MCG/ACT NASAL SPRAY    Place 2 sprays into both nostrils daily.   OXYCODONE-ACETAMINOPHEN (PERCOCET) 5-325 MG TABLET    Take 1 tablet by  mouth every 4 (four) hours as needed for severe pain.     Mancel Bale, MD 10/06/16 [phone]

## 2016-10-06 NOTE — Discharge Instructions (Signed)
Start the new prescriptions to treat the sinus infection, and pain.  Also, use Afrin nasal spray one in each nostril twice a day for 1 week.  Follow-up with your primary care doctor if you are not better in 2 or 3 days.

## 2016-10-06 NOTE — ED Triage Notes (Signed)
Patient here with ongoing frontal headache, congestion. Low grade fever for several days. Has had 2 antibiotics and steroids with no relief. Alert and oriented

## 2016-10-06 NOTE — ED Notes (Signed)
ED Provider at bedside, Dr. Wentz. 

## 2016-10-10 DIAGNOSIS — H9203 Otalgia, bilateral: Secondary | ICD-10-CM | POA: Insufficient documentation

## 2016-10-10 DIAGNOSIS — G43909 Migraine, unspecified, not intractable, without status migrainosus: Secondary | ICD-10-CM | POA: Insufficient documentation

## 2016-10-10 DIAGNOSIS — G43709 Chronic migraine without aura, not intractable, without status migrainosus: Secondary | ICD-10-CM | POA: Diagnosis not present

## 2016-10-10 DIAGNOSIS — J301 Allergic rhinitis due to pollen: Secondary | ICD-10-CM | POA: Diagnosis not present

## 2016-10-10 DIAGNOSIS — R0981 Nasal congestion: Secondary | ICD-10-CM | POA: Insufficient documentation

## 2016-10-10 DIAGNOSIS — Z9109 Other allergy status, other than to drugs and biological substances: Secondary | ICD-10-CM | POA: Insufficient documentation

## 2016-10-18 ENCOUNTER — Other Ambulatory Visit: Payer: Self-pay | Admitting: Internal Medicine

## 2016-10-18 DIAGNOSIS — Z1231 Encounter for screening mammogram for malignant neoplasm of breast: Secondary | ICD-10-CM

## 2016-10-23 DIAGNOSIS — J301 Allergic rhinitis due to pollen: Secondary | ICD-10-CM | POA: Diagnosis not present

## 2016-11-08 ENCOUNTER — Ambulatory Visit: Payer: Self-pay

## 2016-11-13 DIAGNOSIS — H2511 Age-related nuclear cataract, right eye: Secondary | ICD-10-CM | POA: Diagnosis not present

## 2016-11-13 DIAGNOSIS — H16223 Keratoconjunctivitis sicca, not specified as Sjogren's, bilateral: Secondary | ICD-10-CM | POA: Diagnosis not present

## 2016-11-13 DIAGNOSIS — H04123 Dry eye syndrome of bilateral lacrimal glands: Secondary | ICD-10-CM | POA: Diagnosis not present

## 2016-11-28 ENCOUNTER — Ambulatory Visit
Admission: RE | Admit: 2016-11-28 | Discharge: 2016-11-28 | Disposition: A | Discharge status: Home / Self Care | Payer: Federal, State, Local not specified - PPO | Source: Ambulatory Visit | Attending: Internal Medicine | Admitting: Internal Medicine

## 2016-11-28 DIAGNOSIS — Z1231 Encounter for screening mammogram for malignant neoplasm of breast: Secondary | ICD-10-CM | POA: Diagnosis not present

## 2016-12-18 DIAGNOSIS — H04123 Dry eye syndrome of bilateral lacrimal glands: Secondary | ICD-10-CM | POA: Diagnosis not present

## 2016-12-18 DIAGNOSIS — H16223 Keratoconjunctivitis sicca, not specified as Sjogren's, bilateral: Secondary | ICD-10-CM | POA: Diagnosis not present

## 2016-12-18 DIAGNOSIS — H2511 Age-related nuclear cataract, right eye: Secondary | ICD-10-CM | POA: Diagnosis not present

## 2017-03-05 DIAGNOSIS — Z630 Problems in relationship with spouse or partner: Secondary | ICD-10-CM | POA: Diagnosis not present

## 2017-04-01 DIAGNOSIS — Z0000 Encounter for general adult medical examination without abnormal findings: Secondary | ICD-10-CM | POA: Diagnosis not present

## 2017-04-01 DIAGNOSIS — E559 Vitamin D deficiency, unspecified: Secondary | ICD-10-CM | POA: Diagnosis not present

## 2017-04-04 DIAGNOSIS — Z630 Problems in relationship with spouse or partner: Secondary | ICD-10-CM | POA: Diagnosis not present

## 2017-04-08 DIAGNOSIS — Z0000 Encounter for general adult medical examination without abnormal findings: Secondary | ICD-10-CM | POA: Diagnosis not present

## 2017-04-08 DIAGNOSIS — R002 Palpitations: Secondary | ICD-10-CM | POA: Diagnosis not present

## 2017-04-22 ENCOUNTER — Telehealth: Payer: Self-pay | Admitting: Cardiovascular Disease

## 2017-04-22 NOTE — Telephone Encounter (Signed)
Received records from [facility] for appointment on 05/09/17 with Dr Duke Salvia.  Records put with Dr Leonides Sake schedule for 05/09/17. lp

## 2017-05-01 DIAGNOSIS — Z630 Problems in relationship with spouse or partner: Secondary | ICD-10-CM | POA: Diagnosis not present

## 2017-05-09 ENCOUNTER — Ambulatory Visit: Payer: Federal, State, Local not specified - PPO | Admitting: Cardiovascular Disease

## 2017-05-20 DIAGNOSIS — Z630 Problems in relationship with spouse or partner: Secondary | ICD-10-CM | POA: Diagnosis not present

## 2017-05-21 ENCOUNTER — Ambulatory Visit (INDEPENDENT_AMBULATORY_CARE_PROVIDER_SITE_OTHER): Payer: Federal, State, Local not specified - PPO | Admitting: Podiatry

## 2017-05-21 ENCOUNTER — Encounter: Payer: Self-pay | Admitting: Podiatry

## 2017-05-21 ENCOUNTER — Ambulatory Visit (INDEPENDENT_AMBULATORY_CARE_PROVIDER_SITE_OTHER): Payer: Federal, State, Local not specified - PPO

## 2017-05-21 DIAGNOSIS — M722 Plantar fascial fibromatosis: Secondary | ICD-10-CM | POA: Diagnosis not present

## 2017-05-21 DIAGNOSIS — Z0000 Encounter for general adult medical examination without abnormal findings: Secondary | ICD-10-CM | POA: Insufficient documentation

## 2017-05-21 DIAGNOSIS — N951 Menopausal and female climacteric states: Secondary | ICD-10-CM | POA: Insufficient documentation

## 2017-05-21 DIAGNOSIS — R635 Abnormal weight gain: Secondary | ICD-10-CM | POA: Insufficient documentation

## 2017-05-21 DIAGNOSIS — R002 Palpitations: Secondary | ICD-10-CM | POA: Insufficient documentation

## 2017-05-21 MED ORDER — METHYLPREDNISOLONE 4 MG PO TBPK: ORAL_TABLET | ORAL | 0 refills | Status: DC

## 2017-05-21 MED ORDER — MELOXICAM 15 MG PO TABS: 15.0000 mg | ORAL_TABLET | Freq: Every day | ORAL | 3 refills | Status: DC

## 2017-05-21 NOTE — Patient Instructions (Signed)

## 2017-05-22 NOTE — Progress Notes (Signed)
She presents today for follow-up of plantar fasciitis. Last time she was in was 2016. States that starting to hurt again and had plantar fasciitis problems again. She states that she's tried new shoes and she continues to wear her orthotics on a regular basis.  Objective: Vital signs are stable she is alert and oriented 3 pulses are palpable. She has pain on palpation medial calcaneal tubercles bilateral.  Assessment: Plantar fasciitis bilateral.  Plan: Injected the bilateral heels today with Kenalog and local anesthetic after skin prep. Then placed her plantar fascial braces recommended she stay in her tennis shoes and she will go ahead and start with Medrol Dosepak to be followed by meloxicam. We'll follow up with her in 1 month may need to reconsider orthotics.

## 2017-05-27 ENCOUNTER — Ambulatory Visit (INDEPENDENT_AMBULATORY_CARE_PROVIDER_SITE_OTHER): Payer: Federal, State, Local not specified - PPO | Admitting: Cardiovascular Disease

## 2017-05-27 ENCOUNTER — Encounter: Payer: Self-pay | Admitting: Cardiovascular Disease

## 2017-05-27 VITALS — BP 118/70 | HR 72 | Ht 69.0 in | Wt 168.2 lb

## 2017-05-27 DIAGNOSIS — Z5181 Encounter for therapeutic drug level monitoring: Secondary | ICD-10-CM

## 2017-05-27 DIAGNOSIS — G459 Transient cerebral ischemic attack, unspecified: Secondary | ICD-10-CM | POA: Diagnosis not present

## 2017-05-27 DIAGNOSIS — E785 Hyperlipidemia, unspecified: Secondary | ICD-10-CM

## 2017-05-27 DIAGNOSIS — R002 Palpitations: Secondary | ICD-10-CM | POA: Diagnosis not present

## 2017-05-27 MED ORDER — ROSUVASTATIN CALCIUM 20 MG PO TABS: 20.0000 mg | ORAL_TABLET | Freq: Every day | ORAL | 5 refills | Status: DC

## 2017-05-27 NOTE — Patient Instructions (Signed)
Medication Instructions:  START ROSUVASTATIN 20 MG DAILY   Labwork: FASTING LABS WHEN YOU RETURN IN 6 WEEKS  Testing/Procedures: Your physician has recommended that you wear an event monitor. Event monitors are medical devices that record the heart's electrical activity. Doctors most often [place] these monitors to diagnose arrhythmias. Arrhythmias are problems with the speed or rhythm of the heartbeat. The monitor is a small, portable device. You can wear one while you do your normal daily activities. This is usually used to diagnose what is causing palpitations/syncope (passing out). [facility] AT [address]  Follow-Up: Your physician recommends that you schedule a follow-up appointment in: 6 WEEKS   If you need a refill on your cardiac medications before your next appointment, please call your pharmacy.

## 2017-05-27 NOTE — Progress Notes (Signed)
Cardiology Office Note   Date:  05/27/2017   ID:  Cristal, Qadir [birth year], MRN [id number]  PCP:  [name], MD  Cardiologist:   Chilton Si, MD   Chief Complaint  Patient presents with  . New Patient (Initial Visit)  . Palpitations  . Chest Pain    randomly.      History of Present Illness: [name] is a 51 y.o. female with prior TIA who presents for an evaluation of palpitations.  [name] saw Dr. Allyne Gee on 03/2017 and noted intermittent palpitations.  EKG was unremarkable at that time.  She was referred to cardiology for further evaluation. She reports infrequent episodes of palpitations over the last 6 or 7 months. One episode awakened her from sleep. It occurs every one to 2 weeks and last for approximately 2 or 3 minutes at a time. The episode that awakened her from sleep was associated with shortness of breath but most episodes are not. She does note chest tightness with these episodes but not with exertion. There is no associated lightheadedness, dizziness, or presyncope.the episodes tend to occur randomly. She does not drink any caffeine or use over-the-counter decongestants. She has not been feeling particularly anxious recently.  [name] doesn't get much formal exercise but she does walk a lot. She has no exertional symptoms. She had surgery on her knee last year which have limited her ability to exercise  [name] reports having a TIA in 2002.  She notes that her speech was impaired for months. She also lost the ability to write with her right hand. 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 Records are no longer available, but she was reported to care for at [facility] in [place]. She notes no source of her symptoms with ever found on brain imaging. She is caring for her TIA, which is what she is a TIA where she was paralyzed and unable to write with her right  hand.  She notes being told that her EKG is always abnormal.  She was also told that she had an innocent murmur.  She was seen in the [facility] ED 11/2015 after a panic attack.  She had a LHC that showed normal coronary arteries.    Past Medical History:  Diagnosis Date  . Allergy-induced asthma    no current med.  . Complication of anesthesia    states is hard to get to sleep and hard to wake up  . History of CVA (cerebrovascular accident) 2004   states no deficits  . Innocent heart murmur    per pt.  . Medial meniscus tear 08/2016   left knee  . Seasonal allergies     Past Surgical History:  Procedure Laterality Date  . ABDOMINAL HYSTERECTOMY    . CARDIAC CATHETERIZATION N/A 11/28/2015   Procedure: Left Heart Cath and Coronary Angiography;  Surgeon: Yates Decamp, MD;  Location: [facility] INVASIVE CV LAB;  Service: Cardiovascular;  Laterality: N/A;  . CYSTOSCOPY  09/29/2007  . KNEE ARTHROSCOPY Right 05/2016  . KNEE ARTHROSCOPY Left 1994  . KNEE ARTHROSCOPY Left 09/05/2016   Procedure: ARTHROSCOPY KNEE WITH PLICAECTOMY;  Surgeon: Gean Birchwood, MD;  Location: [facility];  Service: Orthopedics;  Laterality: Left;  . LAPAROSCOPIC HYSTERECTOMY  09/29/2007   partial  . TUBAL LIGATION     x 2     Current Outpatient Prescriptions  Medication Sig Dispense Refill  . Calcium Carbonate (  CALTRATE 600 PO) Take 1 tablet by mouth daily.     . cetirizine (ZYRTEC) 10 MG tablet Take 10 mg by mouth daily.    . Cholecalciferol (VITAMIN D PO) Take 12,000 Units by mouth daily.    . Flaxseed, Linseed, (FLAX SEED OIL PO) Take by mouth.    . fluticasone (FLONASE) 50 MCG/ACT nasal spray Place 2 sprays into both nostrils daily. 16 g 0  . meloxicam (MOBIC) 15 MG tablet Take 1 tablet (15 mg total) by mouth daily. 30 tablet 3  . methylPREDNISolone (MEDROL DOSEPAK) 4 MG TBPK tablet 6 day dose pack - take as directed 21 tablet 0  . Omega-3 Fatty Acids (FISH OIL) 1200 MG CAPS Take by mouth daily.    .  vitamin C (ASCORBIC ACID) 500 MG tablet Take 500 mg by mouth daily.    Marland Kitchen XIIDRA 5 % SOLN INT 1 GTT IN OU BID  3  . rosuvastatin (CRESTOR) 20 MG tablet Take 1 tablet (20 mg total) by mouth daily. 30 tablet 5   No current facility-administered medications for this visit.     Allergies:   Tomato    Social History:  The patient  reports that she has never smoked. She has never used smokeless tobacco. She reports that she drinks alcohol. She reports that she does not use drugs.   Family History:  The patient's family history includes Diabetes in her mother and sister; Hyperlipidemia in her mother and sister; Hypertension in her mother and sister; Pancreatic cancer in her father.    ROS:  Please see the history of present illness.   Otherwise, review of systems are positive for none.   All other systems are reviewed and negative.    PHYSICAL EXAM: VS:  BP 118/70   Pulse 72   Ht  (1.753 m)   Wt 76.3 kg (168 lb 3.2 oz)   BMI 24.84 kg/m  , BMI Body mass index is 24.84 kg/m. GENERAL:  Well appearing HEENT:  Pupils equal round and reactive, fundi not visualized, oral mucosa unremarkable NECK:  No jugular venous distention, waveform within normal limits, carotid upstroke brisk and symmetric, no bruits, no thyromegaly LUNGS:  Clear to auscultation bilaterally HEART:  RRR.  PMI not displaced or sustained,S1 and S2 within normal limits, no S3, no S4, no clicks, no rubs, no murmurs ABD:  Flat, positive bowel sounds normal in frequency in pitch, no bruits, no rebound, no guarding, no midline pulsatile mass, no hepatomegaly, no splenomegaly EXT:  2 plus pulses throughout, no edema, no cyanosis no clubbing SKIN:  No rashes no nodules NEURO:  Cranial nerves II through XII grossly intact, motor grossly intact throughout PSYCH:  Cognitively intact, oriented to person place and time    EKG:  EKG is ordered today. The ekg ordered today demonstrates sinus rhythm. Rate 72 bpm. Diffuse T-wave  inversions.   Recent Labs: 10/06/2016: BUN 8; Creatinine, Ser 1.00; Hemoglobin 12.2; Platelets 277; Potassium 3.7; Sodium 137    Lipid Panel    Component Value Date/Time   CHOL 187 11/28/2015 1200   TRIG 110 11/28/2015 1200   HDL 55 11/28/2015 1200   CHOLHDL 3.4 11/28/2015 1200   VLDL 22 11/28/2015 1200   LDLCALC 110 (H) 11/28/2015 1200   04/01/17:  Sodium 143, potassium 4.4, BUN 13, creatinine 0.86+ AST 19, ALT 17 WBC 5, hemoglobin 12.0, hematocrit 37.3, platelets 337 TSH 1.46 Hemoglobin A1c 5.9%    Wt Readings from Last 3 Encounters:  05/27/17 76.3 kg (  168 lb 3.2 oz)  09/05/16 75.1 kg (165 lb 9.6 oz)  11/28/15 74 kg (163 lb 2.3 oz)      ASSESSMENT AND PLAN:  # Palpitations:  30 day event monitor.  Labs were unremarkable with Dr. Allyne Gee.   # TIA: # Hyperlipidemia:  [name] report of a TIA is puzzling to me.  Unfortunately the records are no longer available   She has some records at home and will bring them for our review.  Start rosuvastatin  daily.  Check lipids and CMP in 6 weeks.  We will decide on aspirin after reviewing her records.  # Elevated blood pressure: Improved on repeat.   Current medicines are reviewed at length with the patient today.  The patient does not have concerns regarding medicines.  The following changes have been made:  Start rosuvastatin.  Labs/ tests ordered today include:   Orders Placed This Encounter  Procedures  . Lipid panel  . Hepatic function panel  . CARDIAC EVENT MONITOR  . EKG 12-Lead     Disposition:   FU with Daiden Coltrane C. Duke Salvia, MD, [facility] in 6 weeks.     This note was written with the assistance of speech recognition software.  Please excuse any transcriptional errors.  Signed, Rachelle Edwards C. Duke Salvia, MD, [facility]  05/27/2017 4:29 PM     [facility]

## 2017-06-10 ENCOUNTER — Ambulatory Visit (INDEPENDENT_AMBULATORY_CARE_PROVIDER_SITE_OTHER): Payer: Federal, State, Local not specified - PPO

## 2017-06-10 DIAGNOSIS — R002 Palpitations: Secondary | ICD-10-CM

## 2017-06-20 ENCOUNTER — Encounter: Payer: Self-pay | Admitting: Podiatry

## 2017-06-20 ENCOUNTER — Ambulatory Visit (INDEPENDENT_AMBULATORY_CARE_PROVIDER_SITE_OTHER): Payer: Federal, State, Local not specified - PPO | Admitting: Podiatry

## 2017-06-20 DIAGNOSIS — M722 Plantar fascial fibromatosis: Secondary | ICD-10-CM

## 2017-06-20 DIAGNOSIS — L603 Nail dystrophy: Secondary | ICD-10-CM

## 2017-06-22 NOTE — Progress Notes (Signed)
She presents today for follow-up of bilateral plantar fasciitis. She is concerned about the discoloration of her toenails.  Objective: Vital signs are stable she is alert and oriented 3. She has no reproducible pain on palpation medial calcaneal tubercles bilateral. Possible nail dystrophy versus onychomycosis.  Assessment: Resolved plantar fasciitis by 100%. Nail dystrophy.  Plan: Continue all conservative therapies for one more month and then discontinue. Samples of the skin and nail were taken today for pathologic evaluation and will follow up with her in 1 month.

## 2017-06-25 ENCOUNTER — Other Ambulatory Visit: Payer: Federal, State, Local not specified - PPO | Admitting: Orthotics

## 2017-06-26 ENCOUNTER — Telehealth: Payer: Self-pay | Admitting: *Deleted

## 2017-06-26 NOTE — Telephone Encounter (Addendum)
-----   Message from Elinor ParkinsonMax T Hyatt, [place] sent at 06/26/2017 11:03 AM EDT ----- Negative for fungus.  Positive for trauma. I informed pt of Dr. Geryl RankinsHyatt's review of results. Pt states understanding.

## 2017-07-15 DIAGNOSIS — E785 Hyperlipidemia, unspecified: Secondary | ICD-10-CM | POA: Diagnosis not present

## 2017-07-15 DIAGNOSIS — Z5181 Encounter for therapeutic drug level monitoring: Secondary | ICD-10-CM | POA: Diagnosis not present

## 2017-07-15 LAB — HEPATIC FUNCTION PANEL
ALT: 13 IU/L (ref 0–32)
AST: 17 IU/L (ref 0–40)
Albumin: 4.4 g/dL (ref 3.5–5.5)
Alkaline Phosphatase: 77 IU/L (ref 39–117)
Bilirubin Total: 0.4 mg/dL (ref 0.0–1.2)
Bilirubin, Direct: 0.12 mg/dL (ref 0.00–0.40)
Total Protein: 7.5 g/dL (ref 6.0–8.5)

## 2017-07-15 LAB — LIPID PANEL
Chol/HDL Ratio: 2.2 ratio (ref 0.0–4.4)
Cholesterol, Total: 126 mg/dL (ref 100–199)
HDL: 57 mg/dL (ref >39)
LDL Calculated: 58 mg/dL (ref 0–99)
Triglycerides: 55 mg/dL (ref 0–149)
VLDL Cholesterol Cal: 11 mg/dL (ref 5–40)

## 2017-07-16 ENCOUNTER — Ambulatory Visit (INDEPENDENT_AMBULATORY_CARE_PROVIDER_SITE_OTHER): Payer: Federal, State, Local not specified - PPO | Admitting: Orthotics

## 2017-07-16 DIAGNOSIS — M722 Plantar fascial fibromatosis: Secondary | ICD-10-CM | POA: Diagnosis not present

## 2017-07-16 NOTE — Progress Notes (Signed)
Patient came in today to pick up custom made foot orthotics.  The goals were accomplished and the patient reported no dissatisfaction with said orthotics.  Patient was advised of breakin period and how to report any issues. 

## 2017-07-23 ENCOUNTER — Encounter: Payer: Self-pay | Admitting: Cardiovascular Disease

## 2017-07-23 ENCOUNTER — Ambulatory Visit: Payer: Federal, State, Local not specified - PPO | Admitting: Cardiovascular Disease

## 2017-07-23 VITALS — BP 122/82 | HR 82 | Ht 69.0 in | Wt 174.0 lb

## 2017-07-23 DIAGNOSIS — E785 Hyperlipidemia, unspecified: Secondary | ICD-10-CM

## 2017-07-23 DIAGNOSIS — R002 Palpitations: Secondary | ICD-10-CM | POA: Diagnosis not present

## 2017-07-23 DIAGNOSIS — G459 Transient cerebral ischemic attack, unspecified: Secondary | ICD-10-CM

## 2017-07-23 DIAGNOSIS — R0789 Other chest pain: Secondary | ICD-10-CM | POA: Diagnosis not present

## 2017-07-23 NOTE — Progress Notes (Signed)
Cardiology Office Note   Date:  07/23/2017   ID:  Kathryn RutherfordCynthia W Wood, DOB [birth year], MRN [id number]  PCP:  Dorothyann PengSanders, Robyn, MD  Cardiologist:   Chilton Siiffany [place], MD   No chief complaint on file.    History of Present Illness: Kathryn CorneliaCynthia W Wood is a 51 y.o. female with prior TIA and hyperlipidemia who presents for an evaluation of palpitations.  Ms. [name] saw Dr. Allyne GeeSanders on 03/2017 and noted intermittent palpitations.  EKG was unremarkable at that time.  She was referred to cardiology for further evaluation.  She had a 30-day event monitor 05/2017 that revealed sinus rhythm and no arrhythmias.  She reported chest pain at which time sinus rhythm was noted.  Ms. [name] reports having a TIA in 2002.  She notes that her speech was impaired for months. She also lost the ability to write with her right hand. 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 Records are no longer available, but she was reported to care [facility] [place]. She notes no source of her symptoms with ever found on brain imaging.  She notes being told that her EKG is always abnormal.  She was also told that she had an innocent murmur.  [facility] after [facility] panic attack.  She had a LHC that showed normal coronary arteries.   Since her last appointment Ms. [name] has been feeling well.  She walks her dog briskly for 2 miles most days of the week.  She also goes to the gym 3-4 days per week.  She has no exertional symptoms.  She continues to have intermitted episodes of sharp chest pain that feel like someone punched her in the chest.  It is random and not associated with nausea or diaphoresis but she does get short of breath.  She denies lower extremity edema, orthopnea or PND. She was diagnosed with GERD many years ago but hasn't experienced this lately.  She stops eating at 7 pm and avoids food triggers.  She hasn't  been feeling particularly stressed or anxious lately.    Past Medical History:  Diagnosis Date  . Allergy-induced asthma    no current med.  . Complication of anesthesia    states is hard to get to sleep and hard to wake up  . History of CVA (cerebrovascular accident) 2004   states no deficits  . Innocent heart murmur    per pt.  . Medial meniscus tear 08/2016   left knee  . Seasonal allergies     Past Surgical History:  Procedure Laterality Date  . ABDOMINAL HYSTERECTOMY    . CARDIAC CATHETERIZATION N/A 11/28/2015   Procedure: Left Heart Cath and Coronary Angiography;  Surgeon: Yates DecampJay Ganji, MD;  [facility];  Service: Cardiovascular;  Laterality: N/A;  . CYSTOSCOPY  09/29/2007  . KNEE ARTHROSCOPY Right 05/2016  . KNEE ARTHROSCOPY Left 1994  . KNEE ARTHROSCOPY Left 09/05/2016   Procedure: ARTHROSCOPY KNEE WITH PLICAECTOMY;  Surgeon: Gean BirchwoodFrank Rowan, MD;  Location: [facility];  Service: Orthopedics;  Laterality: Left;  . LAPAROSCOPIC HYSTERECTOMY  09/29/2007   partial  . TUBAL LIGATION     x 2     Current Outpatient Medications  Medication Sig Dispense Refill  . cetirizine (ZYRTEC) 10 MG tablet Take 10 mg by mouth daily.    . Cholecalciferol (VITAMIN D PO) Take 12,000 Units by mouth daily.    .Marland Kitchen  Flaxseed, Linseed, (FLAX SEED OIL PO) Take by mouth.    . fluticasone (FLONASE) 50 MCG/ACT nasal spray Place 2 sprays into both nostrils daily. 16 g 0  . Multiple Vitamins-Minerals (MULTIVITAMIN WOMEN 50+ PO) Take 1 tablet by mouth daily.    . Omega-3 Fatty Acids (FISH OIL) 1200 MG CAPS Take by mouth daily.    . rosuvastatin (CRESTOR) 20 MG tablet Take 20 mg by mouth as directed. 1/2 TABLET BY MOUTH DAILY    . vitamin C (ASCORBIC ACID) 500 MG tablet Take 500 mg by mouth daily.    Marland Kitchen. XIIDRA 5 % SOLN INT 1 GTT IN OU BID  3   No current facility-administered medications for this visit.     Allergies:   Tomato    Social History:  The patient  reports that   has never smoked. she has never used smokeless tobacco. She reports that she drinks alcohol. She reports that she does not use drugs.   Family History:  The patient's family history includes Diabetes in her mother and sister; Hyperlipidemia in her mother and sister; Hypertension in her mother and sister; Pancreatic cancer in her father.    ROS:  Please see the history of present illness.   Otherwise, review of systems are positive for none.   All other systems are reviewed and negative.    PHYSICAL EXAM: VS:  BP 122/82   Pulse 82   Ht 5\' 9"  (1.753 m)   Wt 174 lb (78.9 kg)   BMI 25.70 kg/m  , BMI Body mass index is 25.7 kg/m. GENERAL:  Well appearing HEENT: Pupils equal round and reactive, fundi not visualized, oral mucosa unremarkable NECK:  No jugular venous distention, waveform within normal limits, carotid upstroke brisk and symmetric, no bruits, no thyromegaly LUNGS:  Clear to auscultation bilaterally HEART:  RRR.  PMI not displaced or sustained,S1 and S2 within normal limits, no S3, no S4, no clicks, no rubs, no murmurs ABD:  Flat, positive bowel sounds normal in frequency in pitch, no bruits, no rebound, no guarding, no midline pulsatile mass, no hepatomegaly, no splenomegaly EXT:  2 plus pulses throughout, no edema, no cyanosis no clubbing SKIN:  No rashes no nodules NEURO:  Cranial nerves II through XII grossly intact, motor grossly intact throughout PSYCH:  Cognitively intact, oriented to person place and time   EKG:  EKG is not ordered today. The ekg ordered today demonstrates sinus rhythm. Rate 72 bpm. Diffuse T-wave inversions.  30 Day Event Monitor 06/10/17: Quality: Fair.  Baseline artifact. Predominant rhythm: sinus rhythm Average heart rate: 74 bpm Max heart rate: 141 bpm Min heart rate: 41 bpm  Patient reported chest pain, at which time sinus rhythm was noted.  Recent Labs: 10/06/2016: BUN 8; Creatinine, Ser 1.00; Hemoglobin 12.2; Platelets 277; Potassium  3.7; Sodium 137 07/15/2017: ALT 13    Lipid Panel    Component Value Date/Time   CHOL 126 07/15/2017 0811   TRIG 55 07/15/2017 0811   HDL 57 07/15/2017 0811   CHOLHDL 2.2 07/15/2017 0811   CHOLHDL 3.4 11/28/2015 1200   VLDL 22 11/28/2015 1200   LDLCALC 58 07/15/2017 0811   04/01/17:  Sodium 143, potassium 4.4, BUN 13, creatinine 0.86+ AST 19, ALT 17 WBC 5, hemoglobin 12.0, hematocrit 37.3, platelets 337 TSH 1.46 Hemoglobin A1c 5.9%    Wt Readings from Last 3 Encounters:  07/23/17 174 lb (78.9 kg)  05/27/17 168 lb 3.2 oz (76.3 kg)  09/05/16 165 lb 9.6 oz (75.1 kg)  ASSESSMENT AND PLAN:  # Palpitations:  No arrhythmias on event monitor.  Labs were unremarkable with Dr. Allyne Gee.   # Chest pain: Symptoms are very atypical.  She had no obstructive coronary disease on cath and ambulatory monitor was unremarkable.  She does not feel that her symptoms are related to acid reflux or musculoskeletal.  She is not particularly stressed or anxious.  It is unclear to me what is causing her chest pain but I am quite certain that it is not cardiac.  # TIA: # Hyperlipidemia:  Ms. Karren report of a TIA is puzzling to me.  She reports having a diagnosis of TIA, yet her symptoms were ongoing for months and appeared quite severe.  The symptoms are inconsistent with a transient ischemic attack.  Unfortunately the records are no longer available   She has some records at home and will bring them for our review.  LDL was 58 on 06/2017.  She will reduce rosuvastatin to 10 mg daily.    Current medicines are reviewed at length with the patient today.  The patient does not have concerns regarding medicines.  The following changes have been made: Reduce rosuvastatin to 10 mg daily.  Labs/ tests ordered today include:   No orders of the defined types were placed in this encounter.    Disposition:   FU with Allis Quirarte C. Duke Salvia, MD, [facility]  as needed.   This note was written with the  assistance of speech recognition software.  Please excuse any transcriptional errors.  Signed, Mirtha Jain C. Duke Salvia, MD, [facility]  07/23/2017 3:41 PM    [facility]

## 2017-07-23 NOTE — Patient Instructions (Signed)
Medication Instructions:  DECREASE YOUR ROSUVASTATIN TO 10 MG DAILY   Labwork: NONE  Testing/Procedures: NONE  Follow-Up: AS NEEDED

## 2017-07-25 ENCOUNTER — Telehealth: Payer: Self-pay | Admitting: Cardiovascular Disease

## 2017-07-25 NOTE — Telephone Encounter (Signed)
°  New message ° °Pt verbalized that she is returning call for the rn  °

## 2017-07-25 NOTE — Telephone Encounter (Signed)
Notes recorded by Chilton Siandolph, Tiffany, MD on 07/16/2017 at 11:22 PM EST Cholesterol levels are excellent. Normal liver function. No changes.  Pt notified

## 2017-08-01 DIAGNOSIS — Z01419 Encounter for gynecological examination (general) (routine) without abnormal findings: Secondary | ICD-10-CM | POA: Diagnosis not present

## 2017-08-01 DIAGNOSIS — Z6825 Body mass index (BMI) 25.0-25.9, adult: Secondary | ICD-10-CM | POA: Diagnosis not present

## 2017-09-10 ENCOUNTER — Encounter: Payer: Self-pay | Admitting: Cardiovascular Disease

## 2017-10-16 ENCOUNTER — Other Ambulatory Visit: Payer: Self-pay | Admitting: Internal Medicine

## 2017-10-16 DIAGNOSIS — Z1231 Encounter for screening mammogram for malignant neoplasm of breast: Secondary | ICD-10-CM

## 2017-11-12 ENCOUNTER — Other Ambulatory Visit: Payer: Self-pay | Admitting: Podiatry

## 2017-12-02 ENCOUNTER — Ambulatory Visit: Payer: Self-pay

## 2017-12-04 ENCOUNTER — Ambulatory Visit
Admission: RE | Admit: 2017-12-04 | Discharge: 2017-12-04 | Disposition: A | Discharge status: Home / Self Care | Payer: Federal, State, Local not specified - PPO | Source: Ambulatory Visit | Attending: Internal Medicine | Admitting: Internal Medicine

## 2017-12-04 DIAGNOSIS — Z1231 Encounter for screening mammogram for malignant neoplasm of breast: Secondary | ICD-10-CM | POA: Diagnosis not present

## 2017-12-25 DIAGNOSIS — H04123 Dry eye syndrome of bilateral lacrimal glands: Secondary | ICD-10-CM | POA: Diagnosis not present

## 2017-12-25 DIAGNOSIS — H16223 Keratoconjunctivitis sicca, not specified as Sjogren's, bilateral: Secondary | ICD-10-CM | POA: Diagnosis not present

## 2017-12-25 DIAGNOSIS — H2511 Age-related nuclear cataract, right eye: Secondary | ICD-10-CM | POA: Diagnosis not present

## 2018-01-22 ENCOUNTER — Telehealth: Payer: Self-pay | Admitting: Podiatry

## 2018-01-22 NOTE — Telephone Encounter (Signed)
I'm a pt of Dr. Geryl Rankins and I'm calling to get a refill of my meloxicam. I tried to get it refilled through the pharmacist but they said it had been too long that I needed to contact the doctors office. The best number to reach me at is [phone]. Thank you.

## 2018-01-22 NOTE — Telephone Encounter (Signed)
Left message for pt to contact office to schedule an appt, if she was still having a problem after 6 months.

## 2018-03-03 DIAGNOSIS — H6690 Otitis media, unspecified, unspecified ear: Secondary | ICD-10-CM | POA: Diagnosis not present

## 2018-03-31 ENCOUNTER — Other Ambulatory Visit: Payer: Self-pay | Admitting: Cardiovascular Disease

## 2018-04-01 LAB — LIPID PANEL
Cholesterol: 191 (ref 0–200)
HDL: 58 (ref 35–70)
LDL Cholesterol: 120
LDl/HDL Ratio: 2.1
Triglycerides: 65 (ref 40–160)

## 2018-04-01 LAB — BASIC METABOLIC PANEL
BUN: 13 (ref 4–21)
Creatinine: 0.9 (ref 0.5–1.1)
Glucose: 86
Potassium: 4.4 (ref 3.4–5.3)
Sodium: 143 (ref 137–147)

## 2018-04-01 LAB — CBC AND DIFFERENTIAL
HCT: 37 (ref 36–46)
Hemoglobin: 12 (ref 12.0–16.0)
Neutrophils Absolute: 30
Platelets: 337 (ref 150–399)
WBC: 5

## 2018-04-01 LAB — HEPATIC FUNCTION PANEL
ALT: 17 (ref 7–35)
AST: 19 (ref 13–35)
Alkaline Phosphatase: 83 (ref 25–125)
Bilirubin, Total: 0.3

## 2018-04-01 LAB — TSH: TSH: 1.46 (ref 0.41–5.90)

## 2018-04-01 LAB — HEMOGLOBIN A1C: Hemoglobin A1C: 5.9

## 2018-04-07 ENCOUNTER — Emergency Department (HOSPITAL_COMMUNITY): Payer: No Typology Code available for payment source

## 2018-04-07 ENCOUNTER — Other Ambulatory Visit: Payer: Self-pay

## 2018-04-07 ENCOUNTER — Emergency Department (HOSPITAL_COMMUNITY)
Admission: EM | Admit: 2018-04-07 | Discharge: 2018-04-07 | Disposition: A | Discharge status: Home / Self Care | Payer: No Typology Code available for payment source | Attending: Emergency Medicine | Admitting: Emergency Medicine

## 2018-04-07 ENCOUNTER — Encounter (HOSPITAL_COMMUNITY): Payer: Self-pay | Admitting: Emergency Medicine

## 2018-04-07 DIAGNOSIS — Y929 Unspecified place or not applicable: Secondary | ICD-10-CM | POA: Diagnosis not present

## 2018-04-07 DIAGNOSIS — W19XXXA Unspecified fall, initial encounter: Secondary | ICD-10-CM | POA: Diagnosis not present

## 2018-04-07 DIAGNOSIS — R609 Edema, unspecified: Secondary | ICD-10-CM | POA: Diagnosis not present

## 2018-04-07 DIAGNOSIS — Y990 Civilian activity done for income or pay: Secondary | ICD-10-CM | POA: Diagnosis not present

## 2018-04-07 DIAGNOSIS — Z23 Encounter for immunization: Secondary | ICD-10-CM | POA: Diagnosis not present

## 2018-04-07 DIAGNOSIS — S5011XA Contusion of right forearm, initial encounter: Secondary | ICD-10-CM | POA: Diagnosis not present

## 2018-04-07 DIAGNOSIS — S80211A Abrasion, right knee, initial encounter: Secondary | ICD-10-CM | POA: Diagnosis not present

## 2018-04-07 DIAGNOSIS — Y9301 Activity, walking, marching and hiking: Secondary | ICD-10-CM | POA: Insufficient documentation

## 2018-04-07 DIAGNOSIS — S79911A Unspecified injury of right hip, initial encounter: Secondary | ICD-10-CM | POA: Diagnosis not present

## 2018-04-07 DIAGNOSIS — W01198A Fall on same level from slipping, tripping and stumbling with subsequent striking against other object, initial encounter: Secondary | ICD-10-CM | POA: Insufficient documentation

## 2018-04-07 DIAGNOSIS — S70211A Abrasion, right hip, initial encounter: Secondary | ICD-10-CM | POA: Diagnosis not present

## 2018-04-07 DIAGNOSIS — Z79899 Other long term (current) drug therapy: Secondary | ICD-10-CM | POA: Diagnosis not present

## 2018-04-07 DIAGNOSIS — S59911A Unspecified injury of right forearm, initial encounter: Secondary | ICD-10-CM | POA: Diagnosis not present

## 2018-04-07 DIAGNOSIS — M25551 Pain in right hip: Secondary | ICD-10-CM | POA: Diagnosis not present

## 2018-04-07 DIAGNOSIS — S59919A Unspecified injury of unspecified forearm, initial encounter: Secondary | ICD-10-CM | POA: Diagnosis not present

## 2018-04-07 DIAGNOSIS — S80212A Abrasion, left knee, initial encounter: Secondary | ICD-10-CM | POA: Diagnosis not present

## 2018-04-07 DIAGNOSIS — S8991XA Unspecified injury of right lower leg, initial encounter: Secondary | ICD-10-CM | POA: Diagnosis not present

## 2018-04-07 DIAGNOSIS — M25561 Pain in right knee: Secondary | ICD-10-CM | POA: Diagnosis not present

## 2018-04-07 DIAGNOSIS — T07XXXA Unspecified multiple injuries, initial encounter: Secondary | ICD-10-CM

## 2018-04-07 MED ORDER — NAPROXEN 375 MG PO TABS: 375.0000 mg | ORAL_TABLET | Freq: Two times a day (BID) | ORAL | 0 refills | Status: DC

## 2018-04-07 MED ORDER — IBUPROFEN 800 MG PO TABS
800.0000 mg | ORAL_TABLET | Freq: Once | ORAL | Status: AC
  Administered 2018-04-07: 800 mg via ORAL
  Filled 2018-04-07: qty 1

## 2018-04-07 MED ORDER — TETANUS-DIPHTH-ACELL PERTUSSIS 5-2.5-18.5 LF-MCG/0.5 IM SUSP
0.5000 mL | Freq: Once | INTRAMUSCULAR | Status: AC
  Administered 2018-04-07: 0.5 mL via INTRAMUSCULAR
  Filled 2018-04-07: qty 0.5

## 2018-04-07 NOTE — ED Provider Notes (Signed)
[facility]-EMERGENCY DEPT Provider Note   CSN: [id number] Arrival date & time: 04/07/18  [id number]     History   Chief Complaint Chief Complaint  Patient presents with  . Fall    HPI [name] is a 52 y.o. female.  Who presents the emergency department for evaluation of injuries after mechanical fall.  Patient states that she was walking into work this morning when she tripped over a metal edge sticking out of the ground and fell onto her knees and the right side of her body.  Her injuries occurred just prior to arrival.  She was able to ambulate but can complains of significant pain in her right knee and right hip specifically.  She is also complaining of pain along the right upper extremity and every joint.  She denies hitting her head or losing consciousness.  She states that she is up-to-date on her tetanus vaccination and thinks the last one was in 2017 prior to a knee surgery.  She does not take blood thinners.  She rates her pain as aching, 10 out of 10, nonradiating, constant.  She suffered some abrasions to her knees.  HPI  Past Medical History:  Diagnosis Date  . Allergy-induced asthma    no current med.  . Complication of anesthesia    states is hard to get to sleep and hard to wake up  . History of CVA (cerebrovascular accident) 2004   states no deficits  . Innocent heart murmur    per pt.  . Medial meniscus tear 08/2016   left knee  . Seasonal allergies     Patient Active Problem List   Diagnosis Date Noted  . Encntr for general adult medical exam w/o abnormal findings 05/21/2017  . Menopausal syndrome 05/21/2017  . Palpitations 05/21/2017  . Weight increased 05/21/2017  . Allergy to pollen 10/10/2016  . Migraine headache 10/10/2016  . Nasal congestion 10/10/2016  . Referred otalgia of both ears 10/10/2016  . Panic attack 11/29/2015  . Anxiety state 11/29/2015  . Chest pain 11/28/2015    Past Surgical History:  Procedure  Laterality Date  . ABDOMINAL HYSTERECTOMY    . CARDIAC CATHETERIZATION N/A 11/28/2015   Procedure: Left Heart Cath and Coronary Angiography;  Surgeon: Yates DecampJay Ganji, MD;  [facility];  Service: Cardiovascular;  Laterality: N/A;  . CYSTOSCOPY  09/29/2007  . KNEE ARTHROSCOPY Right 05/2016  . KNEE ARTHROSCOPY Left 1994  . KNEE ARTHROSCOPY Left 09/05/2016   Procedure: ARTHROSCOPY KNEE WITH PLICAECTOMY;  Surgeon: Gean BirchwoodFrank Rowan, MD;  Location: [facility];  Service: Orthopedics;  Laterality: Left;  . LAPAROSCOPIC HYSTERECTOMY  09/29/2007   partial  . TUBAL LIGATION     x 2     OB History   None      Home Medications    Prior to Admission medications   Medication Sig Start Date End Date Taking? Authorizing Provider  cetirizine (ZYRTEC) 10 MG tablet Take 10 mg by mouth daily.    [provider]  Cholecalciferol (VITAMIN D PO) Take 12,000 Units by mouth daily.    [provider]  Flaxseed, Linseed, (FLAX SEED OIL PO) Take by mouth.    [provider]  fluticasone (FLONASE) 50 MCG/ACT nasal spray Place 2 sprays into both nostrils daily. 10/06/16   Mancel BaleWentz, Elliott, MD  Multiple Vitamins-Minerals (MULTIVITAMIN WOMEN 50+ PO) Take 1 tablet by mouth daily.    [provider]  Omega-3 Fatty Acids (FISH OIL) 1200  MG CAPS Take by mouth daily.    [provider]  rosuvastatin (CRESTOR) 20 MG tablet Take 20 mg by mouth as directed. 1/2 TABLET BY MOUTH DAILY    [provider]  rosuvastatin (CRESTOR) 20 MG tablet Take 1 tablet (20 mg total) by mouth as directed. 1/2 TABLET BY MOUTH DAILY 04/01/18   Chilton Siandolph, Tiffany, MD  vitamin C (ASCORBIC ACID) 500 MG tablet Take 500 mg by mouth daily.    [provider]  Benay SpiceXIIDRA 5 % SOLN INT 1 GTT IN OU BID 04/30/17   [provider]    Family History Family History  Problem Relation Age of Onset  . Hypertension Mother   . Diabetes Mother   . Hyperlipidemia Mother   .  Pancreatic cancer Father   . Diabetes Sister   . Hypertension Sister   . Hyperlipidemia Sister     Social History Social History   Tobacco Use  . Smoking status: Never Smoker  . Smokeless tobacco: Never Used  Substance Use Topics  . Alcohol use: Yes    Alcohol/week: 0.0 standard drinks    Comment: occasionally  . Drug use: No     Allergies   Tomato   Review of Systems Review of Systems Ten systems reviewed and are negative for acute change, except as noted in the HPI.    Physical Exam Updated Vital Signs BP 108/83 (BP Location: Left Arm)   Pulse 78   Temp 98.4 F (36.9 C) (Oral)   Resp 20   SpO2 100%   Physical Exam  Physical Exam  Nursing note and vitals reviewed. Constitutional: She is oriented to person, place, and time. She appears well-developed and well-nourished. No distress.  HENT:  Head: Normocephalic and atraumatic.  Eyes: Conjunctivae normal and EOM are normal. Pupils are equal, round, and reactive to light. No scleral icterus.  Neck: Normal range of motion.  Cardiovascular: Normal rate, regular rhythm and normal heart sounds.  Exam reveals no gallop and no friction rub.   No murmur heard. Pulmonary/Chest: Effort normal and breath sounds normal. No respiratory distress.  Abdominal: Soft. Bowel sounds are normal. She exhibits no distension and no mass. There is no tenderness. There is no guarding.  Musculoskeletal: Full range of motion and strength in the right shoulder elbow wrist hand and fingers.  No abrasions, deformities.  Minimal bruising along the proximal region of the ulna.  Abrasions of bilateral knees, tenderness over the right patella, full passive and active range of motion of the knee, no pain with internal and external rotation of the hip, no deformities.  Normal ipsilateral ankle exam on the right.  Normal DP and PT pulses bilaterally Neurological: She is alert and oriented to person, place, and time.  Skin: Skin is warm and dry. She is  not diaphoretic.    ED Treatments / Results  Labs (all labs ordered are listed, but only abnormal results are displayed) Labs Reviewed - No data to display  EKG None  Radiology No results found.  Procedures Procedures (including critical care time)  Medications Ordered in ED Medications  ibuprofen (ADVIL,MOTRIN) tablet 800 mg (has no administration in time range)     Initial Impression / Assessment and Plan / ED Course  I have reviewed the triage vital signs and the nursing notes.  Pertinent labs & imaging results that were available during my care of the patient were reviewed by me and considered in my medical decision making (see chart for details).  Patient X-Ray negative for obvious fracture or dislocation. Pain managed in ED. I reviewed the patient's chart both through care everywhere and her EMR.  I was unable to find her tetanus vaccination and updated it today.  Pt advised to follow up with orthopedics if symptoms persist for possibility of missed fracture diagnosis. Patient given brace while in ED, conservative therapy recommended and discussed. Patient will be dc home & is agreeable with above plan.   Final Clinical Impressions(s) / ED Diagnoses   Final diagnoses:  Fall, initial encounter  Multiple abrasions    ED Discharge Orders    None       Arthor Captain, PA-C 04/07/18 1520    Shaune Pollack, MD 04/08/18 [phone]

## 2018-04-07 NOTE — Discharge Instructions (Addendum)
Your x-rays showed no significant abnormality.  You can expect some soreness over the next few days.  Take the naproxen that I have ordered you with a meal tomorrow morning and tomorrow evening for the next several days to reduce your soreness.  Help with your primary care physician.  Be sure to clean and dress her wounds twice a day.  Return for any new or worsening symptoms.

## 2018-04-07 NOTE — ED Triage Notes (Addendum)
Pt here from work with mechanical fall to both knees then falling over to right side. 10/10 pain in RIGHT wrist, forearm, shoulder; RIGHT hip, BILATERAL knees. No injury to head or face. No LOC. Hx of bilateral knee surgery for meniscus tear.

## 2018-04-07 NOTE — ED Notes (Signed)
Bed: [id number] Expected date:  Expected time:  Means of arrival:  Comments: 52 yo f, knee pain after fall

## 2018-04-08 DIAGNOSIS — S80211S Abrasion, right knee, sequela: Secondary | ICD-10-CM | POA: Diagnosis not present

## 2018-04-08 DIAGNOSIS — W01119S Fall on same level from slipping, tripping and stumbling with subsequent striking against unspecified sharp object, sequela: Secondary | ICD-10-CM | POA: Diagnosis not present

## 2018-04-08 DIAGNOSIS — M25531 Pain in right wrist: Secondary | ICD-10-CM | POA: Insufficient documentation

## 2018-04-08 DIAGNOSIS — Z09 Encounter for follow-up examination after completed treatment for conditions other than malignant neoplasm: Secondary | ICD-10-CM | POA: Diagnosis not present

## 2018-05-25 ENCOUNTER — Encounter: Payer: Self-pay | Admitting: Internal Medicine

## 2018-05-25 DIAGNOSIS — M25531 Pain in right wrist: Secondary | ICD-10-CM

## 2018-05-29 ENCOUNTER — Encounter: Payer: Federal, State, Local not specified - PPO | Admitting: Internal Medicine

## 2018-06-16 ENCOUNTER — Other Ambulatory Visit: Payer: Federal, State, Local not specified - PPO

## 2018-06-16 DIAGNOSIS — Z0000 Encounter for general adult medical examination without abnormal findings: Secondary | ICD-10-CM

## 2018-06-16 LAB — COMPREHENSIVE METABOLIC PANEL
ALT: 13 IU/L (ref 0–32)
AST: 17 IU/L (ref 0–40)
Albumin/Globulin Ratio: 1.3 (ref 1.2–2.2)
Albumin: 4.4 g/dL (ref 3.5–5.5)
Alkaline Phosphatase: 71 IU/L (ref 39–117)
BUN/Creatinine Ratio: 15 (ref 9–23)
BUN: 13 mg/dL (ref 6–24)
Bilirubin Total: 0.2 mg/dL (ref 0.0–1.2)
CO2: 24 mmol/L (ref 20–29)
Calcium: 10 mg/dL (ref 8.7–10.2)
Chloride: 104 mmol/L (ref 96–106)
Creatinine, Ser: 0.86 mg/dL (ref 0.57–1.00)
GFR calc Af Amer: 89 mL/min/{1.73_m2} (ref >59)
GFR calc non Af Amer: 77 mL/min/{1.73_m2} (ref >59)
Globulin, Total: 3.4 g/dL (ref 1.5–4.5)
Glucose: 86 mg/dL (ref 65–99)
Potassium: 4.7 mmol/L (ref 3.5–5.2)
Sodium: 144 mmol/L (ref 134–144)
Total Protein: 7.8 g/dL (ref 6.0–8.5)

## 2018-06-16 LAB — CBC WITH DIFFERENTIAL/PLATELET
Basophils Absolute: 0 10*3/uL (ref 0.0–0.2)
Basos: 0 %
EOS (ABSOLUTE): 0.1 10*3/uL (ref 0.0–0.4)
Eos: 1 %
Hematocrit: 38.8 % (ref 34.0–46.6)
Hemoglobin: 12.6 g/dL (ref 11.1–15.9)
Immature Grans (Abs): 0 10*3/uL (ref 0.0–0.1)
Immature Granulocytes: 0 %
Lymphocytes Absolute: 2.1 10*3/uL (ref 0.7–3.1)
Lymphs: 36 %
MCH: 27.6 pg (ref 26.6–33.0)
MCHC: 32.5 g/dL (ref 31.5–35.7)
MCV: 85 fL (ref 79–97)
Monocytes Absolute: 0.3 10*3/uL (ref 0.1–0.9)
Monocytes: 6 %
Neutrophils Absolute: 3.3 10*3/uL (ref 1.4–7.0)
Neutrophils: 57 %
Platelets: 331 10*3/uL (ref 150–450)
RBC: 4.57 x10E6/uL (ref 3.77–5.28)
RDW: 14.8 % (ref 12.3–15.4)
WBC: 5.8 10*3/uL (ref 3.4–10.8)

## 2018-06-16 LAB — HEMOGLOBIN A1C
Est. average glucose Bld gHb Est-mCnc: 126 mg/dL
Hgb A1c MFr Bld: 6 % — ABNORMAL HIGH (ref 4.8–5.6)

## 2018-06-16 LAB — LIPID PANEL
Chol/HDL Ratio: 2.4 ratio (ref 0.0–4.4)
Cholesterol, Total: 137 mg/dL (ref 100–199)
HDL: 57 mg/dL (ref >39)
LDL Calculated: 69 mg/dL (ref 0–99)
Triglycerides: 55 mg/dL (ref 0–149)
VLDL Cholesterol Cal: 11 mg/dL (ref 5–40)

## 2018-06-17 NOTE — Progress Notes (Signed)
Here are your lab results:  Your blood count is normal. Your cholesterol is great.  Your liver and kidney function are normal.  You are prediabetic with an a1c 6.0. Our goal hba1c is less than 5.6. Please avoid sugary beverages and processed foods including white breads, rice and pasta.    We can discuss further at your next visit.   Take care,   RS

## 2018-06-30 ENCOUNTER — Encounter: Payer: Federal, State, Local not specified - PPO | Admitting: Internal Medicine

## 2018-07-01 ENCOUNTER — Telehealth: Payer: Self-pay | Admitting: *Deleted

## 2018-07-01 ENCOUNTER — Encounter: Payer: Self-pay | Admitting: Podiatry

## 2018-07-01 ENCOUNTER — Ambulatory Visit: Payer: Federal, State, Local not specified - PPO | Admitting: Podiatry

## 2018-07-01 DIAGNOSIS — Q828 Other specified congenital malformations of skin: Secondary | ICD-10-CM | POA: Diagnosis not present

## 2018-07-01 NOTE — Progress Notes (Signed)
Presents today chief complaint of a painful callus to the plantar aspect of the fifth metatarsal of her right foot.  She states that her husband is recently gotten her mother's been ill and she has not had time for [place] to take a look at her orthotics.  She states that if she feels that her orthotics are causing the problem.  Objective: Vital signs are stable she is alert and oriented x3 reactive hyperkeratotic lesion plantar aspect fifth metatarsal head of the right foot no open lesions or wounds are noted.  Assessment: Painful reactive hyperkeratotic lesion sub-fifth met head right.  Plan: Debrided the area today sent her to [facility] for alteration of the orthotic.

## 2018-07-01 NOTE — Telephone Encounter (Signed)
Entered in error

## 2018-07-02 ENCOUNTER — Other Ambulatory Visit: Payer: Self-pay | Admitting: Cardiovascular Disease

## 2018-07-02 ENCOUNTER — Encounter: Payer: Self-pay | Admitting: Internal Medicine

## 2018-07-07 ENCOUNTER — Ambulatory Visit: Payer: Federal, State, Local not specified - PPO | Admitting: Internal Medicine

## 2018-07-07 ENCOUNTER — Encounter: Payer: Self-pay | Admitting: Internal Medicine

## 2018-07-07 VITALS — BP 120/70 | HR 81 | Temp 98.5°F | Ht 67.5 in | Wt 159.6 lb

## 2018-07-07 DIAGNOSIS — Z0000 Encounter for general adult medical examination without abnormal findings: Secondary | ICD-10-CM

## 2018-07-07 DIAGNOSIS — F4321 Adjustment disorder with depressed mood: Secondary | ICD-10-CM | POA: Diagnosis not present

## 2018-07-07 LAB — POCT URINALYSIS DIPSTICK
Bilirubin, UA: NEGATIVE
Glucose, UA: NEGATIVE
Ketones, UA: NEGATIVE
Leukocytes, UA: NEGATIVE
Nitrite, UA: NEGATIVE
Protein, UA: NEGATIVE
Spec Grav, UA: 1.02 (ref 1.010–1.025)
Urobilinogen, UA: 0.2 E.U./dL
pH, UA: 7 (ref 5.0–8.0)

## 2018-07-07 MED ORDER — SERTRALINE HCL 25 MG PO TABS: ORAL_TABLET | ORAL | 0 refills | Status: DC

## 2018-07-07 NOTE — Progress Notes (Signed)
Subjective:     Patient ID: [name] , female    DOB: April 08, 1966 , 52 y.o.   MRN: [id number]   Chief Complaint  Patient presents with  . Annual Exam    HPI  She is here today for a full physical examination.  She is followed by Dr. Charlesetta Garibaldi for her gyn exams.  Unfortunately, her husband has passed from prostate cancer since her last visit. The hospice chaplain did contact her, but she was not interested in talking to him. She has yet to seek counseling.     Past Medical History:  Diagnosis Date  . Allergy-induced asthma    no current med.  . Complication of anesthesia    states is hard to get to sleep and hard to wake up  . History of CVA (cerebrovascular accident) 2004   states no deficits  . Innocent heart murmur    per pt.  . Medial meniscus tear 08/2016   left knee  . Seasonal allergies      Family History  Problem Relation Age of Onset  . Hypertension Mother   . Diabetes Mother   . Hyperlipidemia Mother   . Pancreatic cancer Father   . Diabetes Sister   . Hypertension Sister   . Hyperlipidemia Sister      Current Outpatient Medications:  .  Calcium-Vitamin D-Vitamin K [phone] MG-UNT-MCG TABS, Take 1 tablet by mouth daily., Disp: , Rfl:  .  cetirizine (ZYRTEC) 10 MG tablet, Take 10 mg by mouth daily., Disp: , Rfl:  .  fluticasone (FLONASE) 50 MCG/ACT nasal spray, Place 2 sprays into both nostrils daily. (Patient taking differently: Place 1 spray into both nostrils daily. ), Disp: 16 g, Rfl: 0 .  meloxicam (MOBIC) 15 MG tablet, TK 1 T PO QD WF, Disp: , Rfl: 1 .  Multiple Vitamins-Minerals (MULTIVITAMIN WOMEN 50+ PO), Take 1 tablet by mouth daily., Disp: , Rfl:  .  PREVIDENT 5000 ENAMEL PROTECT 1.1-5 % PSTE, Take 1 application by mouth at bedtime., Disp: , Rfl:  .  rosuvastatin (CRESTOR) 20 MG tablet, Take 1 tablet (20 mg total) by mouth daily. Please make overdue appt with Dr. Oval Linsey before anymore refills. 1st attempt, Disp: 30 tablet, Rfl: 0 .   XIIDRA 5 % SOLN, Apply 1 drop to eye 2 (two) times daily. , Disp: , Rfl: 3   Allergies  Allergen Reactions  . Tomato Anaphylaxis     No LMP recorded. Patient has had a hysterectomy.. Negative for: breast discharge, breast lump(s), breast pain and breast self exam. Associated symptoms include abnormal vaginal bleeding. Pertinent negatives include abnormal bleeding (hematology), anxiety, decreased libido, depression, difficulty falling sleep, dyspareunia, history of infertility, nocturia, sexual dysfunction, sleep disturbances, urinary incontinence, urinary urgency, vaginal discharge and vaginal itching. Diet regular.The patient states her exercise level is    . The patient's tobacco use is:  Social History   Tobacco Use  Smoking Status Never Smoker  Smokeless Tobacco Never Used  . She has been exposed to passive smoke. The patient's alcohol use is:  Social History   Substance and Sexual Activity  Alcohol Use Yes  . Alcohol/week: 0.0 standard drinks   Comment: occasionally     Review of Systems  Constitutional: Negative.   HENT: Negative.   Eyes: Negative.   Respiratory: Negative.   Cardiovascular: Negative.   Gastrointestinal: Negative.   Endocrine: Negative.   Genitourinary: Negative.   Musculoskeletal: Negative.   Skin: Negative.   Allergic/Immunologic: Negative.   Neurological: Negative.  Hematological: Negative.   Psychiatric/Behavioral: Positive for decreased concentration and dysphoric mood (Unfortunately, her husband recently passed from metastatic prostate ca. Shortly after his death, she had to go to [place] to care for her sick mother and move her into a long-term care facility. She does have siblings in [place].). Negative for suicidal ideas.   She is overwhelmed with traveling to [place] for her mom, grieving for her husband and trying to deal with work stressors. She has FMLA paperwork for completion today.   Today's Vitals   07/07/18 1022  BP: 120/70  Pulse: 81  Temp: 98.5  F (36.9 C)  TempSrc: Oral  SpO2: 97%  Weight: 159 lb 9.6 oz (72.4 kg)  Height: 5' 7.5" (1.715 m)   Body mass index is 24.63 kg/m.   Objective:  Physical Exam  Constitutional: She is oriented to person, place, and time. She appears well-developed and well-nourished. She appears distressed.  HENT:  Head: Normocephalic and atraumatic.  Right Ear: External ear normal.  Left Ear: External ear normal.  Nose: Nose normal.  Mouth/Throat: Oropharynx is clear and moist.  Eyes: Pupils are equal, round, and reactive to light. Conjunctivae and EOM are normal.  Neck: Normal range of motion. Neck supple.  Cardiovascular: Normal rate, regular rhythm, normal heart sounds and intact distal pulses.  Pulmonary/Chest: Effort normal and breath sounds normal. Right breast exhibits no inverted nipple, no mass, no nipple discharge, no skin change and no tenderness. Left breast exhibits no inverted nipple, no mass, no nipple discharge, no skin change and no tenderness.  Abdominal: Soft. Bowel sounds are normal.  Genitourinary:  Genitourinary Comments: deferred  Musculoskeletal: Normal range of motion.  Neurological: She is alert and oriented to person, place, and time.  Skin: Skin is warm and dry.  Psychiatric: She has a normal mood and affect.  Nursing note and vitals reviewed.       Assessment And Plan:     1. Routine general medical examination at health care facility  A full exam was performed.  Importance of monthly self breast exams was discussed with the patient.  PATIENT HAS BEEN ADVISED TO GET 30-45 MINUTES REGULAR EXERCISE NO LESS THAN FOUR TO FIVE DAYS PER WEEK - BOTH WEIGHTBEARING EXERCISES AND AEROBIC ARE RECOMMENDED.  SHE IS ADVISED TO FOLLOW A HEALTHY DIET WITH AT LEAST SIX FRUITS/VEGGIES PER DAY, DECREASE INTAKE OF RED MEAT, AND TO INCREASE FISH INTAKE TO TWO DAYS PER WEEK.  MEATS/FISH SHOULD NOT BE FRIED, BAKED OR BROILED IS PREFERABLE.  I SUGGEST WEARING SPF 50 SUNSCREEN ON EXPOSED  PARTS AND ESPECIALLY WHEN IN THE DIRECT SUNLIGHT FOR AN EXTENDED PERIOD OF TIME.  PLEASE AVOID FAST FOOD RESTAURANTS AND INCREASE YOUR WATER INTAKE.  - CMP14+EGFR - CBC - Lipid panel - Hemoglobin A1c  2. Adjustment disorder with depressed mood  PHQ-9 performed. I will refer her for therapy. I will also start her on Zoloft, 78m nightly x 7 days, then she will increase to two tabs daily. She agrees to rto in four weeks for re-evaluation.   - Ambulatory referral to Psychology        RMaximino Greenland MD

## 2018-07-08 ENCOUNTER — Other Ambulatory Visit: Payer: Self-pay

## 2018-07-10 ENCOUNTER — Other Ambulatory Visit: Payer: Self-pay | Admitting: Internal Medicine

## 2018-07-10 MED ORDER — TEMAZEPAM 15 MG PO CAPS: 15.0000 mg | ORAL_CAPSULE | Freq: Every evening | ORAL | 0 refills | Status: DC | PRN

## 2018-08-06 DIAGNOSIS — R109 Unspecified abdominal pain: Secondary | ICD-10-CM | POA: Diagnosis not present

## 2018-08-06 DIAGNOSIS — Z01419 Encounter for gynecological examination (general) (routine) without abnormal findings: Secondary | ICD-10-CM | POA: Diagnosis not present

## 2018-08-06 DIAGNOSIS — Z6824 Body mass index (BMI) 24.0-24.9, adult: Secondary | ICD-10-CM | POA: Diagnosis not present

## 2018-08-07 DIAGNOSIS — F4323 Adjustment disorder with mixed anxiety and depressed mood: Secondary | ICD-10-CM | POA: Diagnosis not present

## 2018-08-11 ENCOUNTER — Other Ambulatory Visit: Payer: Self-pay | Admitting: Internal Medicine

## 2018-08-11 ENCOUNTER — Other Ambulatory Visit: Payer: Self-pay | Admitting: Cardiovascular Disease

## 2018-08-14 ENCOUNTER — Ambulatory Visit: Payer: Federal, State, Local not specified - PPO | Admitting: Internal Medicine

## 2018-08-14 DIAGNOSIS — Z09 Encounter for follow-up examination after completed treatment for conditions other than malignant neoplasm: Secondary | ICD-10-CM | POA: Diagnosis not present

## 2018-08-14 DIAGNOSIS — R102 Pelvic and perineal pain: Secondary | ICD-10-CM | POA: Diagnosis not present

## 2018-09-03 DIAGNOSIS — F4323 Adjustment disorder with mixed anxiety and depressed mood: Secondary | ICD-10-CM | POA: Diagnosis not present

## 2018-09-05 DIAGNOSIS — R194 Change in bowel habit: Secondary | ICD-10-CM | POA: Diagnosis not present

## 2018-09-05 DIAGNOSIS — K4090 Unilateral inguinal hernia, without obstruction or gangrene, not specified as recurrent: Secondary | ICD-10-CM | POA: Diagnosis not present

## 2018-09-05 DIAGNOSIS — R1032 Left lower quadrant pain: Secondary | ICD-10-CM | POA: Diagnosis not present

## 2018-10-13 DIAGNOSIS — F4323 Adjustment disorder with mixed anxiety and depressed mood: Secondary | ICD-10-CM | POA: Diagnosis not present

## 2018-11-03 ENCOUNTER — Other Ambulatory Visit: Payer: Self-pay | Admitting: Internal Medicine

## 2018-11-03 DIAGNOSIS — Z1231 Encounter for screening mammogram for malignant neoplasm of breast: Secondary | ICD-10-CM

## 2018-12-09 ENCOUNTER — Ambulatory Visit: Payer: Federal, State, Local not specified - PPO | Admitting: Podiatry

## 2018-12-09 ENCOUNTER — Other Ambulatory Visit: Payer: Self-pay

## 2018-12-09 ENCOUNTER — Encounter: Payer: Self-pay | Admitting: Podiatry

## 2018-12-09 ENCOUNTER — Ambulatory Visit (INDEPENDENT_AMBULATORY_CARE_PROVIDER_SITE_OTHER): Payer: Federal, State, Local not specified - PPO

## 2018-12-09 VITALS — Temp 96.3°F

## 2018-12-09 DIAGNOSIS — M779 Enthesopathy, unspecified: Principal | ICD-10-CM

## 2018-12-09 DIAGNOSIS — M7751 Other enthesopathy of right foot: Secondary | ICD-10-CM

## 2018-12-09 DIAGNOSIS — F4323 Adjustment disorder with mixed anxiety and depressed mood: Secondary | ICD-10-CM | POA: Diagnosis not present

## 2018-12-09 DIAGNOSIS — M778 Other enthesopathies, not elsewhere classified: Secondary | ICD-10-CM

## 2018-12-09 NOTE — Progress Notes (Signed)
She presents today complaining of pain to the first metatarsal phalangeal joint of the right foot.  This is been going on for about the past 2-1/2 months she states that it hurts with range of motion.  Noticed it initially couple months ago and calm down and was doing fine and then over the past few weeks has started to become more troublesome again.  Objective: Vital signs are stable alert and oriented x3.  Pulses are palpable.  She has pain on palpation and end range of motion of the first metatarsal phalangeal joint.  The majority of the pain is the dorsal lateral aspect of the first metatarsal phalangeal joint.  Radiographs demonstrate early osteoarthritic dorsal spur noted more than likely is associated with capsulitis particularly with dorsal lateral joint pain.  Assessment: Capsulitis early hallux limitus first metatarsal phalangeal joint of the right foot.  Plan: Discussed etiology pathology conservative versus surgical therapies.  At this point after sterile Betadine skin prep I injected 2 mg of dexamethasone and local anesthetic to the point of maximal tenderness.  Tolerated procedure well without complications.  Follow-up with her as needed.

## 2018-12-18 ENCOUNTER — Ambulatory Visit: Payer: Federal, State, Local not specified - PPO | Admitting: Podiatry

## 2018-12-25 DIAGNOSIS — F4323 Adjustment disorder with mixed anxiety and depressed mood: Secondary | ICD-10-CM | POA: Diagnosis not present

## 2018-12-26 DIAGNOSIS — F332 Major depressive disorder, recurrent severe without psychotic features: Secondary | ICD-10-CM | POA: Diagnosis not present

## 2018-12-26 DIAGNOSIS — F411 Generalized anxiety disorder: Secondary | ICD-10-CM | POA: Diagnosis not present

## 2018-12-31 ENCOUNTER — Other Ambulatory Visit: Payer: Self-pay | Admitting: Cardiovascular Disease

## 2018-12-31 NOTE — Telephone Encounter (Signed)
Rosuvastatin 20 mg refilled  

## 2019-01-05 DIAGNOSIS — F332 Major depressive disorder, recurrent severe without psychotic features: Secondary | ICD-10-CM | POA: Diagnosis not present

## 2019-01-05 DIAGNOSIS — F411 Generalized anxiety disorder: Secondary | ICD-10-CM | POA: Diagnosis not present

## 2019-01-15 DIAGNOSIS — F411 Generalized anxiety disorder: Secondary | ICD-10-CM | POA: Diagnosis not present

## 2019-01-15 DIAGNOSIS — F332 Major depressive disorder, recurrent severe without psychotic features: Secondary | ICD-10-CM | POA: Diagnosis not present

## 2019-01-23 ENCOUNTER — Ambulatory Visit
Admission: RE | Admit: 2019-01-23 | Discharge: 2019-01-23 | Disposition: A | Discharge status: Home / Self Care | Payer: Federal, State, Local not specified - PPO | Source: Ambulatory Visit | Attending: Internal Medicine | Admitting: Internal Medicine

## 2019-01-23 ENCOUNTER — Other Ambulatory Visit: Payer: Self-pay

## 2019-01-23 DIAGNOSIS — Z1231 Encounter for screening mammogram for malignant neoplasm of breast: Secondary | ICD-10-CM

## 2019-01-30 DIAGNOSIS — F411 Generalized anxiety disorder: Secondary | ICD-10-CM | POA: Diagnosis not present

## 2019-01-30 DIAGNOSIS — F332 Major depressive disorder, recurrent severe without psychotic features: Secondary | ICD-10-CM | POA: Diagnosis not present

## 2019-02-12 ENCOUNTER — Ambulatory Visit: Payer: Federal, State, Local not specified - PPO | Admitting: Orthotics

## 2019-02-12 ENCOUNTER — Other Ambulatory Visit: Payer: Self-pay

## 2019-02-12 DIAGNOSIS — M778 Other enthesopathies, not elsewhere classified: Secondary | ICD-10-CM

## 2019-02-12 NOTE — Progress Notes (Signed)
Patient came in today and had concerns about the wear pattern on the soles of her shoes.  They du exhibit excess wear on the lateral border, but very little on the medial aspect under the hallux.  Since she does have hx of rt mpj pain, I incorporated a reverse morton extension b/l.

## 2019-02-13 DIAGNOSIS — F332 Major depressive disorder, recurrent severe without psychotic features: Secondary | ICD-10-CM | POA: Diagnosis not present

## 2019-02-13 DIAGNOSIS — F411 Generalized anxiety disorder: Secondary | ICD-10-CM | POA: Diagnosis not present

## 2019-03-04 DIAGNOSIS — H2511 Age-related nuclear cataract, right eye: Secondary | ICD-10-CM | POA: Diagnosis not present

## 2019-03-04 DIAGNOSIS — H16223 Keratoconjunctivitis sicca, not specified as Sjogren's, bilateral: Secondary | ICD-10-CM | POA: Diagnosis not present

## 2019-03-04 DIAGNOSIS — H04123 Dry eye syndrome of bilateral lacrimal glands: Secondary | ICD-10-CM | POA: Diagnosis not present

## 2019-03-06 ENCOUNTER — Other Ambulatory Visit: Payer: Self-pay | Admitting: Cardiovascular Disease

## 2019-03-06 NOTE — Telephone Encounter (Signed)
Rx refused. Patient last seen in 06/2017 by Dr Oval Linsey. Advised to come back PRN at that time. Deferred to PCP as patient is no longer under cardiology care.

## 2019-03-09 DIAGNOSIS — F411 Generalized anxiety disorder: Secondary | ICD-10-CM | POA: Diagnosis not present

## 2019-03-09 DIAGNOSIS — F332 Major depressive disorder, recurrent severe without psychotic features: Secondary | ICD-10-CM | POA: Diagnosis not present

## 2019-03-27 DIAGNOSIS — F411 Generalized anxiety disorder: Secondary | ICD-10-CM | POA: Diagnosis not present

## 2019-03-27 DIAGNOSIS — F332 Major depressive disorder, recurrent severe without psychotic features: Secondary | ICD-10-CM | POA: Diagnosis not present

## 2019-04-09 ENCOUNTER — Ambulatory Visit: Payer: Federal, State, Local not specified - PPO | Admitting: Orthotics

## 2019-04-09 ENCOUNTER — Other Ambulatory Visit: Payer: Self-pay

## 2019-04-09 DIAGNOSIS — Q828 Other specified congenital malformations of skin: Secondary | ICD-10-CM

## 2019-04-09 DIAGNOSIS — M722 Plantar fascial fibromatosis: Secondary | ICD-10-CM

## 2019-04-09 DIAGNOSIS — M778 Other enthesopathies, not elsewhere classified: Secondary | ICD-10-CM

## 2019-04-09 NOTE — Progress Notes (Signed)
Patient presented today with pain under sesmoids and wanted an orthotic adjustment.  I added a k-wedge and she got immediate relief.

## 2019-04-16 DIAGNOSIS — F411 Generalized anxiety disorder: Secondary | ICD-10-CM | POA: Diagnosis not present

## 2019-04-16 DIAGNOSIS — F332 Major depressive disorder, recurrent severe without psychotic features: Secondary | ICD-10-CM | POA: Diagnosis not present

## 2019-04-28 DIAGNOSIS — F4323 Adjustment disorder with mixed anxiety and depressed mood: Secondary | ICD-10-CM | POA: Diagnosis not present

## 2019-05-07 ENCOUNTER — Encounter: Payer: Self-pay | Admitting: Orthotics

## 2019-05-07 ENCOUNTER — Other Ambulatory Visit: Payer: Self-pay

## 2019-05-07 ENCOUNTER — Ambulatory Visit (INDEPENDENT_AMBULATORY_CARE_PROVIDER_SITE_OTHER): Payer: Federal, State, Local not specified - PPO | Admitting: Orthotics

## 2019-05-07 DIAGNOSIS — M722 Plantar fascial fibromatosis: Secondary | ICD-10-CM | POA: Diagnosis not present

## 2019-05-07 DIAGNOSIS — F332 Major depressive disorder, recurrent severe without psychotic features: Secondary | ICD-10-CM | POA: Diagnosis not present

## 2019-05-07 DIAGNOSIS — F411 Generalized anxiety disorder: Secondary | ICD-10-CM | POA: Diagnosis not present

## 2019-05-07 DIAGNOSIS — Q828 Other specified congenital malformations of skin: Secondary | ICD-10-CM

## 2019-05-07 NOTE — Progress Notes (Signed)
Make new pair of f/o with Reverse Morton's right, k-wedge right. Rest same as 2018

## 2019-05-18 ENCOUNTER — Ambulatory Visit (INDEPENDENT_AMBULATORY_CARE_PROVIDER_SITE_OTHER)
Admission: RE | Admit: 2019-05-18 | Discharge: 2019-05-18 | Disposition: A | Discharge status: Home / Self Care | Payer: Federal, State, Local not specified - PPO | Source: Ambulatory Visit

## 2019-05-18 DIAGNOSIS — R202 Paresthesia of skin: Secondary | ICD-10-CM | POA: Diagnosis not present

## 2019-05-18 DIAGNOSIS — M25511 Pain in right shoulder: Secondary | ICD-10-CM | POA: Diagnosis not present

## 2019-05-18 DIAGNOSIS — R200 Anesthesia of skin: Secondary | ICD-10-CM

## 2019-05-18 NOTE — Discharge Instructions (Signed)
Recommend icing the shoulder. Take 2 tabs of over-the-counter Aleve (220 mg per tab) twice daily for the next week. Go to ER for spreading of numbness, development of pain, discoloration, inability to move your fingers.

## 2019-05-18 NOTE — ED Provider Notes (Signed)
Virtual Visit via Video Note:  [name]  initiated request for [facility]medicine visit [facility]. I connected with [name]  on 05/18/2019 at 10:06 AM  for a synchronized telemedicine visit using a video enabled HIPPA compliant telemedicine application. I verified that I am speaking with [name]  using two identifiers. [place]any Hall-Potvin, PA-C  was physically located in a [facility] site and [name] was located at a different location.   The limitations of evaluation and management by telemedicine as well as the availability of in-person appointments were discussed. Patient was informed that she  may incur a bill ( including co-pay) for this virtual visit encounter. [name]  expressed understanding and gave verbal consent to proceed with virtual visit.     History of Present Illness:Kathryn Lacretia NicksW [name]  is a 53 y.o. female history of chronic right shoulder pain presents with acute concern of right third digit numbness since yesterday.  Patient states that her shoulder pain has worsened over time: No recent sudden change, trauma to the area.  States is difficult for her to change her clothes.  Patient does have an orthopedist, so she has appointment on 9/23, tends to keep this.  Patient primarily concerned due to third digit numbness.  Patient denies decreased range of motion, strength, change in appearance, swelling.  Patient does endorse history of carpal tunnel syndrome in same hand, the states this feels different.  Patient has not tried anything for her finger numbness.  Review of Systems  Constitutional: Negative for fever and malaise/fatigue.  Respiratory: Negative for cough and shortness of breath.   Cardiovascular: Negative for chest pain and palpitations.  Gastrointestinal: Negative for abdominal pain, diarrhea and vomiting.  Musculoskeletal: Negative for joint pain and myalgias.  Skin: Negative  for itching and rash.  Neurological: Positive for sensory change. Negative for dizziness, tingling, tremors and weakness.   Past Medical History:  Diagnosis Date  . Allergy-induced asthma    no current med.  . Complication of anesthesia    states is hard to get to sleep and hard to wake up  . History of CVA (cerebrovascular accident) 2004   states no deficits  . Innocent heart murmur    per pt.  . Medial meniscus tear 08/2016   left knee  . Seasonal allergies     Allergies  Allergen Reactions  . Tomato Anaphylaxis        Observations/Objective: 53 year old female sitting in no acute distress.  Patient is able to speak in full sentences without coughing, sneezing, wheezing.  Patient is able to move all digits and wrist with full active ROM of the right hand.  Patient reporting cap refill less than 2 seconds without discoloration of affected digit.  Assessment and Plan: 1.  Right shoulder pain Patient to continue chronic medications, follow-up with orthopedics.  2.  Numbness of finger Likely second to chronic pain/inflammation of shoulder.  No neck injury, pain.  Exam reassuring.  Patient to trial Aleve, follow-up with orthopedic provider in a few days.  Return precautions discussed, patient verbalized understanding and is agreeable to plan.  Follow Up Instructions: Patient to keep 9/23 appointment with her orthopedic provider.   I discussed the assessment and treatment plan with the patient. The patient was provided an opportunity to ask questions and all were answered. The patient agreed with the plan and demonstrated an understanding of the instructions.   The patient was advised to call  back or seek an in-person evaluation if the symptoms worsen or if the condition fails to improve as anticipated.  I provided 20 minutes of non-face-to-face time during this encounter.    [place], PA-C  05/18/2019 10:06 AM        Hall-Potvin, [place], PA-C 05/19/19  [id number]

## 2019-05-20 ENCOUNTER — Telehealth: Payer: Self-pay

## 2019-05-20 ENCOUNTER — Ambulatory Visit (INDEPENDENT_AMBULATORY_CARE_PROVIDER_SITE_OTHER): Payer: Federal, State, Local not specified - PPO

## 2019-05-20 ENCOUNTER — Ambulatory Visit (INDEPENDENT_AMBULATORY_CARE_PROVIDER_SITE_OTHER): Payer: Federal, State, Local not specified - PPO | Admitting: Orthopedic Surgery

## 2019-05-20 ENCOUNTER — Other Ambulatory Visit: Payer: Self-pay

## 2019-05-20 DIAGNOSIS — M25511 Pain in right shoulder: Secondary | ICD-10-CM | POA: Diagnosis not present

## 2019-05-20 DIAGNOSIS — F411 Generalized anxiety disorder: Secondary | ICD-10-CM | POA: Diagnosis not present

## 2019-05-20 DIAGNOSIS — F332 Major depressive disorder, recurrent severe without psychotic features: Secondary | ICD-10-CM | POA: Diagnosis not present

## 2019-05-20 NOTE — Telephone Encounter (Signed)
Noted! Thank you

## 2019-05-20 NOTE — Telephone Encounter (Signed)
I put order for RUE EMG/NCV but should have been BUE. Can you please edit this referral?

## 2019-05-22 NOTE — Progress Notes (Signed)
Office Visit Note   Patient: [name]           Date of Birth: 1965/11/21           MRN: [id number] Visit Date: 05/20/2019 Requested by: Glendale Chard, [address] [place],  [place] [address] PCP: Glendale Chard, MD  Subjective: Chief Complaint  Patient presents with  . Right Shoulder - Pain    HPI: [name] is a 53 y.o. female who presents to the office complaining of right shoulder pain.  Patient states that she woke with acute right shoulder pain 1 week ago.  She had no pain prior.  She denies any injury that she can recall.  On Sunday she started having numbness in her middle finger and her thumb.  She denies any neck pain or radicular symptoms.  She localizes pain to her anterior shoulder.  She has tried taking Aleve without significant relief in her symptoms.  Her pain is worse with overhead motion and external rotation.  Patient has a history of carpal tunnel syndrome bilaterally for which she has tried bracing in the past with good relief.  She has never had surgery for this issue.  She has controlled her carpal tunnel syndrome with bracing and has not had any significant issue over the past year.  She has never had a shoulder injection, shoulder MRI, shoulder surgery.              ROS:  All systems reviewed are negative as they relate to the chief complaint within the history of present illness.  Patient denies fevers or chills.  Assessment & Plan: Visit Diagnoses:  1. Right shoulder pain, unspecified chronicity     Plan: Patient is a 53 year old female who presents with acute right shoulder pain.  History and exam are suspicious for rotator cuff tendinitis versus biceps tendinitis.  Discussed options with patient.  She wants to try therapy and anti-inflammatory medications prior to trying an injection.  Prescribed Voltaren orally and physical therapy for the shoulder.  Patient will follow-up in 6 weeks and we will decide on an  ultrasound-guided shoulder injection at that time based on her symptoms.  Patient is also complaining of right middle finger numbness and tingling and has begun to have right thumb numbness and tingling.  She has also started having some numbness and tingling in the left hand as well.  She has a history of carpal tunnel syndrome.  She has never had surgery.  Ordered nerve conduction study of the right and left wrist to evaluate for carpal tunnel syndrome.  Follow-Up Instructions: No follow-ups on file.   Orders:  Orders Placed This Encounter  Procedures  . XR Shoulder Right  . Ambulatory referral to Physical Medicine Rehab   No orders of the defined types were placed in this encounter.     Procedures: No procedures performed   Clinical Data: No additional findings.  Objective: Vital Signs: There were no vitals taken for this visit.  Physical Exam:  Constitutional: Patient appears well-developed HEENT:  Head: Normocephalic Eyes:EOM are normal Neck: Normal range of motion Cardiovascular: Normal rate Pulmonary/chest: Effort normal Neurologic: Patient is alert Skin: Skin is warm Psychiatric: Patient has normal mood and affect  Ortho Exam:  Right shoulder Exam Able to fully forward flex and abduct shoulder overhead No loss of ER relative to the other shoulder.  Good endpoint with ER No TTP over the [facility] joint TTP over the bicipital groove 5/5 motor strength  of the subscapularis, supraspinatus, and infraspinatus muscles.  Pain with resisted supraspinatus and infraspinatus testing. Positive Hawkins impingement 5/5 grip strength, forearm pronation/supination, and bicep strength.  Pain with resisted bicep testing. Posterior grinding with passive range of motion on exam  Sensation intact of the index, ring, pinky fingers of the right hand.  Reduced sensation of the palmar middle and palmar thumb fingers Positive Tinel's of the right wrist  Specialty Comments:  No specialty  comments available.  Imaging: No results found.   PMFS History: Patient Active Problem List   Diagnosis Date Noted  . Right wrist pain 04/08/2018  . Encntr for general adult medical exam w/o abnormal findings 05/21/2017  . Menopausal syndrome 05/21/2017  . Palpitations 05/21/2017  . Weight increased 05/21/2017  . Allergy to pollen 10/10/2016  . Migraine headache 10/10/2016  . Nasal congestion 10/10/2016  . Referred otalgia of both ears 10/10/2016  . Panic attack 11/29/2015  . Anxiety state 11/29/2015  . Chest pain 11/28/2015   Past Medical History:  Diagnosis Date  . Allergy-induced asthma    no current med.  . Complication of anesthesia    states is hard to get to sleep and hard to wake up  . History of CVA (cerebrovascular accident) 2004   states no deficits  . Innocent heart murmur    per pt.  . Medial meniscus tear 08/2016   left knee  . Seasonal allergies     Family History  Problem Relation Age of Onset  . Hypertension Mother   . Diabetes Mother   . Hyperlipidemia Mother   . Pancreatic cancer Father   . Diabetes Sister   . Hypertension Sister   . Hyperlipidemia Sister     Past Surgical History:  Procedure Laterality Date  . ABDOMINAL HYSTERECTOMY    . CARDIAC CATHETERIZATION N/A 11/28/2015   Procedure: Left Heart Cath and Coronary Angiography;  Surgeon: Yates Decamp, MD;  Location: [facility] INVASIVE CV LAB;  Service: Cardiovascular;  Laterality: N/A;  . CYSTOSCOPY  09/29/2007  . KNEE ARTHROSCOPY Right 05/2016  . KNEE ARTHROSCOPY Left 1994  . KNEE ARTHROSCOPY Left 09/05/2016   Procedure: ARTHROSCOPY KNEE WITH PLICAECTOMY;  Surgeon: Gean Birchwood, MD;  Location: [facility];  Service: Orthopedics;  Laterality: Left;  . LAPAROSCOPIC HYSTERECTOMY  09/29/2007   partial  . TUBAL LIGATION     x 2   Social History   Occupational History  . Not on file  Tobacco Use  . Smoking status: Never Smoker  . Smokeless tobacco: Never Used  Substance and Sexual  Activity  . Alcohol use: Yes    Alcohol/week: 0.0 standard drinks    Comment: occasionally  . Drug use: No  . Sexual activity: Not on file

## 2019-05-24 ENCOUNTER — Encounter: Payer: Self-pay | Admitting: Orthopedic Surgery

## 2019-05-28 ENCOUNTER — Ambulatory Visit: Payer: Federal, State, Local not specified - PPO | Admitting: Orthotics

## 2019-05-28 ENCOUNTER — Other Ambulatory Visit: Payer: Self-pay

## 2019-05-28 DIAGNOSIS — Q828 Other specified congenital malformations of skin: Secondary | ICD-10-CM

## 2019-05-28 DIAGNOSIS — M722 Plantar fascial fibromatosis: Secondary | ICD-10-CM

## 2019-05-28 DIAGNOSIS — M778 Other enthesopathies, not elsewhere classified: Secondary | ICD-10-CM

## 2019-05-28 NOTE — Progress Notes (Signed)

## 2019-06-01 DIAGNOSIS — M25511 Pain in right shoulder: Secondary | ICD-10-CM | POA: Diagnosis not present

## 2019-06-01 DIAGNOSIS — M79601 Pain in right arm: Secondary | ICD-10-CM | POA: Diagnosis not present

## 2019-06-01 DIAGNOSIS — M7521 Bicipital tendinitis, right shoulder: Secondary | ICD-10-CM | POA: Diagnosis not present

## 2019-06-08 DIAGNOSIS — M79601 Pain in right arm: Secondary | ICD-10-CM | POA: Diagnosis not present

## 2019-06-08 DIAGNOSIS — M7521 Bicipital tendinitis, right shoulder: Secondary | ICD-10-CM | POA: Diagnosis not present

## 2019-06-08 DIAGNOSIS — M25511 Pain in right shoulder: Secondary | ICD-10-CM | POA: Diagnosis not present

## 2019-06-09 DIAGNOSIS — F332 Major depressive disorder, recurrent severe without psychotic features: Secondary | ICD-10-CM | POA: Diagnosis not present

## 2019-06-09 DIAGNOSIS — F411 Generalized anxiety disorder: Secondary | ICD-10-CM | POA: Diagnosis not present

## 2019-06-09 DIAGNOSIS — F4323 Adjustment disorder with mixed anxiety and depressed mood: Secondary | ICD-10-CM | POA: Diagnosis not present

## 2019-06-10 DIAGNOSIS — F332 Major depressive disorder, recurrent severe without psychotic features: Secondary | ICD-10-CM | POA: Diagnosis not present

## 2019-06-10 DIAGNOSIS — F411 Generalized anxiety disorder: Secondary | ICD-10-CM | POA: Diagnosis not present

## 2019-06-11 DIAGNOSIS — M79601 Pain in right arm: Secondary | ICD-10-CM | POA: Diagnosis not present

## 2019-06-11 DIAGNOSIS — M7521 Bicipital tendinitis, right shoulder: Secondary | ICD-10-CM | POA: Diagnosis not present

## 2019-06-11 DIAGNOSIS — M25511 Pain in right shoulder: Secondary | ICD-10-CM | POA: Diagnosis not present

## 2019-06-12 ENCOUNTER — Ambulatory Visit (INDEPENDENT_AMBULATORY_CARE_PROVIDER_SITE_OTHER): Payer: Federal, State, Local not specified - PPO | Admitting: Physical Medicine and Rehabilitation

## 2019-06-12 DIAGNOSIS — R202 Paresthesia of skin: Secondary | ICD-10-CM | POA: Diagnosis not present

## 2019-06-12 NOTE — Progress Notes (Signed)
 .  Numeric Pain Rating Scale and Functional Assessment Average Pain 3   In the last MONTH (on 0-10 scale) has pain interfered with the following?  1. General activity like being  able to carry out your everyday physical activities such as walking, climbing stairs, carrying groceries, or moving a chair?  Rating(4)   

## 2019-06-15 DIAGNOSIS — M25511 Pain in right shoulder: Secondary | ICD-10-CM | POA: Diagnosis not present

## 2019-06-15 DIAGNOSIS — M7521 Bicipital tendinitis, right shoulder: Secondary | ICD-10-CM | POA: Diagnosis not present

## 2019-06-15 DIAGNOSIS — M79601 Pain in right arm: Secondary | ICD-10-CM | POA: Diagnosis not present

## 2019-06-15 NOTE — Procedures (Signed)
EMG & NCV Findings: All nerve conduction studies (as indicated in the following tables) were within normal limits.    All examined muscles (as indicated in the following table) showed no evidence of electrical instability.    Impression: Essentially NORMAL electrodiagnostic study of the right upper limb.    There is no significant electrodiagnostic evidence of nerve entrapment, brachial plexopathy or cervical radiculopathy.    As you know, purely sensory or demyelinating radiculopathies and chemical radiculitis may not be detected with this particular electrodiagnostic study.   **This electrodiagnostic study cannot rule out small fiber polyneuropathy and dysesthesias from central pain syndromes such as stroke or central pain sensitization syndromes such as fibromyalgia.  Myotomal referral pain from trigger points is also not excluded.  Recommendations: 1.  Follow-up with referring physician. 2.  Continue current management of symptoms.  ___________________________ Naaman Plummer FAAPMR Board Certified, American Board of Physical Medicine and Rehabilitation    Nerve Conduction Studies Anti Sensory Summary Table   Stim Site NR Peak (ms) Norm Peak (ms) P-T Amp (V) Norm P-T Amp Site1 Site2 Delta-P (ms) Dist (cm) Vel (m/s) Norm Vel (m/s)  Right Median Acr Palm Anti Sensory (2nd Digit)  31C  Wrist    3.6 <3.6 27.9 >10 Wrist Palm 1.6 0.0    Palm    2.0 <2.0 24.5         Right Radial Anti Sensory (Base 1st Digit)  30.3C  Wrist    2.2 <3.1 57.8  Wrist Base 1st Digit 2.2 0.0    Right Ulnar Anti Sensory (5th Digit)  30.5C  Wrist    3.6 <3.7 42.3 >15.0 Wrist 5th Digit 3.6 14.0 39 >38   Motor Summary Table   Stim Site NR Onset (ms) Norm Onset (ms) O-P Amp (mV) Norm O-P Amp Site1 Site2 Delta-0 (ms) Dist (cm) Vel (m/s) Norm Vel (m/s)  Right Median Motor (Abd Poll Brev)  30.1C  Wrist    3.3 <4.2 9.4 >5 Elbow Wrist 3.7 23.0 62 >50  Elbow    7.0  7.6         Right Ulnar Motor (Abd Dig Min)   30C  Wrist    3.4 <4.2 7.2 >3 B Elbow Wrist 3.1 21.5 69 >53  B Elbow    6.5  8.2  A Elbow B Elbow 1.2 10.0 83 >53  A Elbow    7.7  8.3          EMG   Side Muscle Nerve Root Ins Act Fibs Psw Amp Dur Poly Recrt Int Dennie Bible Comment  Right Abd Poll Brev Median C8-T1 Nml Nml Nml Nml Nml 0 Nml Nml   Right 1stDorInt Ulnar C8-T1 Nml Nml Nml Nml Nml 0 Nml Nml   Right PronatorTeres Median C6-7 Nml Nml Nml Nml Nml 0 Nml Nml   Right Biceps Musculocut C5-6 Nml Nml Nml Nml Nml 0 Nml Nml   Right Deltoid Axillary C5-6 Nml Nml Nml Nml Nml 0 Nml Nml     Nerve Conduction Studies Anti Sensory Left/Right Comparison   Stim Site L Lat (ms) R Lat (ms) L-R Lat (ms) L Amp (V) R Amp (V) L-R Amp (%) Site1 Site2 L Vel (m/s) R Vel (m/s) L-R Vel (m/s)  Median Acr Palm Anti Sensory (2nd Digit)  31C  Wrist  3.6   27.9  Wrist Palm     Palm  2.0   24.5        Radial Anti Sensory (Base 1st Digit)  30.3C  Wrist  2.2   57.8  Wrist Base 1st Digit     Ulnar Anti Sensory (5th Digit)  30.5C  Wrist  3.6   42.3  Wrist 5th Digit  39    Motor Left/Right Comparison   Stim Site L Lat (ms) R Lat (ms) L-R Lat (ms) L Amp (mV) R Amp (mV) L-R Amp (%) Site1 Site2 L Vel (m/s) R Vel (m/s) L-R Vel (m/s)  Median Motor (Abd Poll Brev)  30.1C  Wrist  3.3   9.4  Elbow Wrist  62   Elbow  7.0   7.6        Ulnar Motor (Abd Dig Min)  30C  Wrist  3.4   7.2  B Elbow Wrist  69   B Elbow  6.5   8.2  A Elbow B Elbow  83   A Elbow  7.7   8.3           Waveforms:

## 2019-06-15 NOTE — Progress Notes (Signed)
[name] - 53 y.o. female MRN [id number]  Date of birth: [birth year]  Office Visit Note: Visit Date: 06/12/2019 PCP: Glendale Chard, MD Referred by: Glendale Chard, MD  Subjective: Chief Complaint  Patient presents with  . Right Hand - Numbness, Pain   HPI: [name] is a 53 y.o. female who comes in today At the request of Dr. Anderson Malta for electrodiagnostic studies of the upper limbs.  Patient is a right-hand-dominant female with 1 months of numbness in the right third digit.  She she will at times have right palm tingling right at the wrist and palm and some right wrist pain.  She does not endorse any tingling or numbness in the thumb or first finger but the first 2 first 2 digits can have some pain.  She denies any left-sided symptoms.  She denies diabetes although she has a family history of diabetes.  She reports many years ago she was diagnosed with carpal tunnel syndrome in both hands.  She has worn braces.  She denies any frank radicular symptoms.  Symptoms do seem to refer up to the elbow.  ROS Otherwise per HPI.  Assessment & Plan: Visit Diagnoses:  1. Paresthesia of skin     Plan: Impression: Essentially NORMAL electrodiagnostic study of the right upper limb.    There is no significant electrodiagnostic evidence of nerve entrapment, brachial plexopathy or cervical radiculopathy.    As you know, purely sensory or demyelinating radiculopathies and chemical radiculitis may not be detected with this particular electrodiagnostic study.   **This electrodiagnostic study cannot rule out small fiber polyneuropathy and dysesthesias from central pain syndromes such as stroke or central pain sensitization syndromes such as fibromyalgia.  Myotomal referral pain from trigger points is also not excluded.  Recommendations: 1.  Follow-up with referring physician. 2.  Continue current management of symptoms.   Meds & Orders: No orders of the defined types were  placed in this encounter.   Orders Placed This Encounter  Procedures  . NCV with EMG (electromyography)    Follow-up: Return for  Anderson Malta, M.D. as scheduled.   Procedures: No procedures performed  EMG & NCV Findings: All nerve conduction studies (as indicated in the following tables) were within normal limits.    All examined muscles (as indicated in the following table) showed no evidence of electrical instability.    Impression: Essentially NORMAL electrodiagnostic study of the right upper limb.    There is no significant electrodiagnostic evidence of nerve entrapment, brachial plexopathy or cervical radiculopathy.    As you know, purely sensory or demyelinating radiculopathies and chemical radiculitis may not be detected with this particular electrodiagnostic study.   **This electrodiagnostic study cannot rule out small fiber polyneuropathy and dysesthesias from central pain syndromes such as stroke or central pain sensitization syndromes such as fibromyalgia.  Myotomal referral pain from trigger points is also not excluded.  Recommendations: 1.  Follow-up with referring physician. 2.  Continue current management of symptoms.  ___________________________ Laurence Spates FAAPMR Board Certified, American Board of Physical Medicine and Rehabilitation    Nerve Conduction Studies Anti Sensory Summary Table   Stim Site NR Peak (ms) Norm Peak (ms) P-T Amp (V) Norm P-T Amp Site1 Site2 Delta-P (ms) Dist (cm) Vel (m/s) Norm Vel (m/s)  Right Median Acr Palm Anti Sensory (2nd Digit)  31C  Wrist    3.6 <3.6 27.9 >10 Wrist Palm 1.6 0.0    Palm    2.0 <2.0 24.5  Right Radial Anti Sensory (Base 1st Digit)  30.3C  Wrist    2.2 <3.1 57.8  Wrist Base 1st Digit 2.2 0.0    Right Ulnar Anti Sensory (5th Digit)  30.5C  Wrist    3.6 <3.7 42.3 >15.0 Wrist 5th Digit 3.6 14.0 39 >38   Motor Summary Table   Stim Site NR Onset (ms) Norm Onset (ms) O-P Amp (mV) Norm O-P Amp Site1  Site2 Delta-0 (ms) Dist (cm) Vel (m/s) Norm Vel (m/s)  Right Median Motor (Abd Poll Brev)  30.1C  Wrist    3.3 <4.2 9.4 >5 Elbow Wrist 3.7 23.0 62 >50  Elbow    7.0  7.6         Right Ulnar Motor (Abd Dig Min)  30C  Wrist    3.4 <4.2 7.2 >3 B Elbow Wrist 3.1 21.5 69 >53  B Elbow    6.5  8.2  A Elbow B Elbow 1.2 10.0 83 >53  A Elbow    7.7  8.3          EMG   Side Muscle Nerve Root Ins Act Fibs Psw Amp Dur Poly Recrt Int Dennie Bible Comment  Right Abd Poll Brev Median C8-T1 Nml Nml Nml Nml Nml 0 Nml Nml   Right 1stDorInt Ulnar C8-T1 Nml Nml Nml Nml Nml 0 Nml Nml   Right PronatorTeres Median C6-7 Nml Nml Nml Nml Nml 0 Nml Nml   Right Biceps Musculocut C5-6 Nml Nml Nml Nml Nml 0 Nml Nml   Right Deltoid Axillary C5-6 Nml Nml Nml Nml Nml 0 Nml Nml     Nerve Conduction Studies Anti Sensory Left/Right Comparison   Stim Site L Lat (ms) R Lat (ms) L-R Lat (ms) L Amp (V) R Amp (V) L-R Amp (%) Site1 Site2 L Vel (m/s) R Vel (m/s) L-R Vel (m/s)  Median Acr Palm Anti Sensory (2nd Digit)  31C  Wrist  3.6   27.9  Wrist Palm     Palm  2.0   24.5        Radial Anti Sensory (Base 1st Digit)  30.3C  Wrist  2.2   57.8  Wrist Base 1st Digit     Ulnar Anti Sensory (5th Digit)  30.5C  Wrist  3.6   42.3  Wrist 5th Digit  39    Motor Left/Right Comparison   Stim Site L Lat (ms) R Lat (ms) L-R Lat (ms) L Amp (mV) R Amp (mV) L-R Amp (%) Site1 Site2 L Vel (m/s) R Vel (m/s) L-R Vel (m/s)  Median Motor (Abd Poll Brev)  30.1C  Wrist  3.3   9.4  Elbow Wrist  62   Elbow  7.0   7.6        Ulnar Motor (Abd Dig Min)  30C  Wrist  3.4   7.2  B Elbow Wrist  69   B Elbow  6.5   8.2  A Elbow B Elbow  83   A Elbow  7.7   8.3           Waveforms:            Clinical History: No specialty comments available.   She reports that she has never smoked. She has never used smokeless tobacco.  Recent Labs    06/16/18 0955  HGBA1C 6.0*    Objective:  VS:  HT:    WT:   BMI:     BP:   HR: bpm  TEMP: ( )  RESP:  Physical Exam Musculoskeletal:        General: No swelling, tenderness or deformity.     Comments: Inspection reveals no atrophy of the bilateral APB or FDI or hand intrinsics. There is no swelling, color changes, allodynia or dystrophic changes. There is 5 out of 5 strength in the bilateral wrist extension, finger abduction and long finger flexion.  There is altered sensation in the right middle finger. There is a negative Phalen's test bilaterally. There is a negative Hoffmann's test bilaterally.  Skin:    General: Skin is warm and dry.     Findings: No erythema or rash.  Neurological:     General: No focal deficit present.     Mental Status: She is alert and oriented to person, place, and time.     Motor: No weakness or abnormal muscle tone.     Coordination: Coordination normal.  Psychiatric:        Mood and Affect: Mood normal.        Behavior: Behavior normal.     Ortho Exam Imaging: No results found.  Past Medical/Family/Surgical/Social History: Medications & Allergies reviewed per EMR, new medications updated. Patient Active Problem List   Diagnosis Date Noted  . Right wrist pain 04/08/2018  . Encntr for general adult medical exam w/o abnormal findings 05/21/2017  . Menopausal syndrome 05/21/2017  . Palpitations 05/21/2017  . Weight increased 05/21/2017  . Allergy to pollen 10/10/2016  . Migraine headache 10/10/2016  . Nasal congestion 10/10/2016  . Referred otalgia of both ears 10/10/2016  . Panic attack 11/29/2015  . Anxiety state 11/29/2015  . Chest pain 11/28/2015   Past Medical History:  Diagnosis Date  . Allergy-induced asthma    no current med.  . Complication of anesthesia    states is hard to get to sleep and hard to wake up  . History of CVA (cerebrovascular accident) 2004   states no deficits  . Innocent heart murmur    per pt.  . Medial meniscus tear 08/2016   left knee  . Seasonal allergies    Family History  Problem Relation Age of  Onset  . Hypertension Mother   . Diabetes Mother   . Hyperlipidemia Mother   . Pancreatic cancer Father   . Diabetes Sister   . Hypertension Sister   . Hyperlipidemia Sister    Past Surgical History:  Procedure Laterality Date  . ABDOMINAL HYSTERECTOMY    . CARDIAC CATHETERIZATION N/A 11/28/2015   Procedure: Left Heart Cath and Coronary Angiography;  Surgeon: Yates DecampJay Ganji, MD;  [facility];  Service: Cardiovascular;  Laterality: N/A;  . CYSTOSCOPY  09/29/2007  . KNEE ARTHROSCOPY Right 05/2016  . KNEE ARTHROSCOPY Left 1994  . KNEE ARTHROSCOPY Left 09/05/2016   Procedure: ARTHROSCOPY KNEE WITH PLICAECTOMY;  Surgeon: Gean BirchwoodFrank Rowan, MD;  Location: [facility];  Service: Orthopedics;  Laterality: Left;  . LAPAROSCOPIC HYSTERECTOMY  09/29/2007   partial  . TUBAL LIGATION     x 2   Social History   Occupational History  . Not on file  Tobacco Use  . Smoking status: Never Smoker  . Smokeless tobacco: Never Used  Substance and Sexual Activity  . Alcohol use: Yes    Alcohol/week: 0.0 standard drinks    Comment: occasionally  . Drug use: No  . Sexual activity: Not on file

## 2019-06-17 DIAGNOSIS — M25511 Pain in right shoulder: Secondary | ICD-10-CM | POA: Diagnosis not present

## 2019-06-17 DIAGNOSIS — M7521 Bicipital tendinitis, right shoulder: Secondary | ICD-10-CM | POA: Diagnosis not present

## 2019-06-17 DIAGNOSIS — M79601 Pain in right arm: Secondary | ICD-10-CM | POA: Diagnosis not present

## 2019-06-22 ENCOUNTER — Ambulatory Visit: Payer: Federal, State, Local not specified - PPO | Admitting: Orthopedic Surgery

## 2019-06-22 DIAGNOSIS — M7521 Bicipital tendinitis, right shoulder: Secondary | ICD-10-CM | POA: Diagnosis not present

## 2019-06-22 DIAGNOSIS — M79601 Pain in right arm: Secondary | ICD-10-CM | POA: Diagnosis not present

## 2019-06-22 DIAGNOSIS — M25511 Pain in right shoulder: Secondary | ICD-10-CM | POA: Diagnosis not present

## 2019-06-24 DIAGNOSIS — F332 Major depressive disorder, recurrent severe without psychotic features: Secondary | ICD-10-CM | POA: Diagnosis not present

## 2019-06-24 DIAGNOSIS — F411 Generalized anxiety disorder: Secondary | ICD-10-CM | POA: Diagnosis not present

## 2019-06-25 DIAGNOSIS — M79601 Pain in right arm: Secondary | ICD-10-CM | POA: Diagnosis not present

## 2019-06-25 DIAGNOSIS — M25511 Pain in right shoulder: Secondary | ICD-10-CM | POA: Diagnosis not present

## 2019-06-25 DIAGNOSIS — M7521 Bicipital tendinitis, right shoulder: Secondary | ICD-10-CM | POA: Diagnosis not present

## 2019-06-29 ENCOUNTER — Other Ambulatory Visit: Payer: Self-pay

## 2019-06-29 ENCOUNTER — Other Ambulatory Visit: Payer: Federal, State, Local not specified - PPO

## 2019-06-29 DIAGNOSIS — R7309 Other abnormal glucose: Secondary | ICD-10-CM | POA: Diagnosis not present

## 2019-06-29 DIAGNOSIS — M7521 Bicipital tendinitis, right shoulder: Secondary | ICD-10-CM | POA: Diagnosis not present

## 2019-06-29 DIAGNOSIS — M79601 Pain in right arm: Secondary | ICD-10-CM | POA: Diagnosis not present

## 2019-06-29 DIAGNOSIS — M25511 Pain in right shoulder: Secondary | ICD-10-CM | POA: Diagnosis not present

## 2019-06-29 DIAGNOSIS — Z0000 Encounter for general adult medical examination without abnormal findings: Secondary | ICD-10-CM | POA: Diagnosis not present

## 2019-06-30 LAB — LIPID PANEL
Chol/HDL Ratio: 2.7 ratio (ref 0.0–4.4)
Cholesterol, Total: 177 mg/dL (ref 100–199)
HDL: 66 mg/dL (ref >39)
LDL Chol Calc (NIH): 100 mg/dL — ABNORMAL HIGH (ref 0–99)
Triglycerides: 56 mg/dL (ref 0–149)
VLDL Cholesterol Cal: 11 mg/dL (ref 5–40)

## 2019-06-30 LAB — CMP14+EGFR
ALT: 18 IU/L (ref 0–32)
AST: 18 IU/L (ref 0–40)
Albumin/Globulin Ratio: 1.6 (ref 1.2–2.2)
Albumin: 4.7 g/dL (ref 3.8–4.9)
Alkaline Phosphatase: 73 IU/L (ref 39–117)
BUN/Creatinine Ratio: 22 (ref 9–23)
BUN: 17 mg/dL (ref 6–24)
Bilirubin Total: 0.3 mg/dL (ref 0.0–1.2)
CO2: 27 mmol/L (ref 20–29)
Calcium: 10 mg/dL (ref 8.7–10.2)
Chloride: 102 mmol/L (ref 96–106)
Creatinine, Ser: 0.76 mg/dL (ref 0.57–1.00)
GFR calc Af Amer: 103 mL/min/{1.73_m2} (ref >59)
GFR calc non Af Amer: 89 mL/min/{1.73_m2} (ref >59)
Globulin, Total: 2.9 g/dL (ref 1.5–4.5)
Glucose: 83 mg/dL (ref 65–99)
Potassium: 4.7 mmol/L (ref 3.5–5.2)
Sodium: 139 mmol/L (ref 134–144)
Total Protein: 7.6 g/dL (ref 6.0–8.5)

## 2019-06-30 LAB — CBC
Hematocrit: 37.7 % (ref 34.0–46.6)
Hemoglobin: 12.1 g/dL (ref 11.1–15.9)
MCH: 28.6 pg (ref 26.6–33.0)
MCHC: 32.1 g/dL (ref 31.5–35.7)
MCV: 89 fL (ref 79–97)
Platelets: 320 10*3/uL (ref 150–450)
RBC: 4.23 x10E6/uL (ref 3.77–5.28)
RDW: 14.2 % (ref 11.7–15.4)
WBC: 5.1 10*3/uL (ref 3.4–10.8)

## 2019-06-30 LAB — HEMOGLOBIN A1C
Est. average glucose Bld gHb Est-mCnc: 114 mg/dL
Hgb A1c MFr Bld: 5.6 % (ref 4.8–5.6)

## 2019-07-01 ENCOUNTER — Encounter: Payer: Self-pay | Admitting: Orthopedic Surgery

## 2019-07-01 ENCOUNTER — Other Ambulatory Visit: Payer: Self-pay

## 2019-07-01 ENCOUNTER — Ambulatory Visit: Payer: Federal, State, Local not specified - PPO | Admitting: Orthopedic Surgery

## 2019-07-01 DIAGNOSIS — M5412 Radiculopathy, cervical region: Secondary | ICD-10-CM

## 2019-07-01 DIAGNOSIS — M7521 Bicipital tendinitis, right shoulder: Secondary | ICD-10-CM | POA: Diagnosis not present

## 2019-07-01 DIAGNOSIS — M79601 Pain in right arm: Secondary | ICD-10-CM | POA: Diagnosis not present

## 2019-07-01 DIAGNOSIS — M25511 Pain in right shoulder: Secondary | ICD-10-CM | POA: Diagnosis not present
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
## 2019-07-08 DIAGNOSIS — F332 Major depressive disorder, recurrent severe without psychotic features: Secondary | ICD-10-CM | POA: Diagnosis not present

## 2019-07-08 DIAGNOSIS — F411 Generalized anxiety disorder: Secondary | ICD-10-CM | POA: Diagnosis not present

## 2019-07-13 ENCOUNTER — Ambulatory Visit: Payer: Federal, State, Local not specified - PPO | Admitting: Internal Medicine

## 2019-07-13 ENCOUNTER — Other Ambulatory Visit: Payer: Self-pay

## 2019-07-13 ENCOUNTER — Encounter: Payer: Self-pay | Admitting: Internal Medicine

## 2019-07-13 VITALS — BP 114/68 | HR 78 | Temp 98.8°F | Ht 67.6 in | Wt 139.4 lb

## 2019-07-13 DIAGNOSIS — E7800 Pure hypercholesterolemia, unspecified: Secondary | ICD-10-CM

## 2019-07-13 DIAGNOSIS — R202 Paresthesia of skin: Secondary | ICD-10-CM

## 2019-07-13 DIAGNOSIS — E559 Vitamin D deficiency, unspecified: Secondary | ICD-10-CM | POA: Diagnosis not present

## 2019-07-13 DIAGNOSIS — Z0000 Encounter for general adult medical examination without abnormal findings: Secondary | ICD-10-CM

## 2019-07-13 LAB — POCT URINALYSIS DIPSTICK
Bilirubin, UA: NEGATIVE
Glucose, UA: NEGATIVE
Ketones, UA: NEGATIVE
Leukocytes, UA: NEGATIVE
Nitrite, UA: NEGATIVE
Protein, UA: NEGATIVE
Spec Grav, UA: >=1.03 — AB (ref 1.010–1.025)
Urobilinogen, UA: 0.2 E.U./dL
pH, UA: 5.5 (ref 5.0–8.0)

## 2019-07-13 MED ORDER — ROSUVASTATIN CALCIUM 10 MG PO TABS: 10.0000 mg | ORAL_TABLET | Freq: Every day | ORAL | 2 refills | Status: DC

## 2019-07-13 MED ORDER — ROSUVASTATIN CALCIUM 20 MG PO TABS: ORAL_TABLET | ORAL | 2 refills | Status: DC

## 2019-07-13 NOTE — Patient Instructions (Signed)
Health Maintenance, Female Adopting a healthy lifestyle and getting preventive care are important in promoting health and wellness. Ask your health care provider about:  The right schedule for you to have regular tests and exams.  Things you can do on your own to prevent diseases and keep yourself healthy. What should I know about diet, weight, and exercise? Eat a healthy diet   Eat a diet that includes plenty of vegetables, fruits, low-fat dairy products, and lean protein.  Do not eat a lot of foods that are high in solid fats, added sugars, or sodium. Maintain a healthy weight Body mass index (BMI) is used to identify weight problems. It estimates body fat based on height and weight. Your health care provider can help determine your BMI and help you achieve or maintain a healthy weight. Get regular exercise Get regular exercise. This is one of the most important things you can do for your health. Most adults should:  Exercise for at least 150 minutes each week. The exercise should increase your heart rate and make you sweat (moderate-intensity exercise).  Do strengthening exercises at least twice a week. This is in addition to the moderate-intensity exercise.  Spend less time sitting. Even light physical activity can be beneficial. Watch cholesterol and blood lipids Have your blood tested for lipids and cholesterol at 54 years of age, then have this test every 5 years. Have your cholesterol levels checked more often if:  Your lipid or cholesterol levels are high.  You are older than 54 years of age.  You are at high risk for heart disease. What should I know about cancer screening? Depending on your health history and family history, you may need to have cancer screening at various ages. This may include screening for:  Breast cancer.  Cervical cancer.  Colorectal cancer.  Skin cancer.  Lung cancer. What should I know about heart disease, diabetes, and high blood  pressure? Blood pressure and heart disease  High blood pressure causes heart disease and increases the risk of stroke. This is more likely to develop in people who have high blood pressure readings, are of African descent, or are overweight.  Have your blood pressure checked: ? Every 3-5 years if you are 18-39 years of age. ? Every year if you are 40 years old or older. Diabetes Have regular diabetes screenings. This checks your fasting blood sugar level. Have the screening done:  Once every three years after age 40 if you are at a normal weight and have a low risk for diabetes.  More often and at a younger age if you are overweight or have a high risk for diabetes. What should I know about preventing infection? Hepatitis B If you have a higher risk for hepatitis B, you should be screened for this virus. Talk with your health care provider to find out if you are at risk for hepatitis B infection. Hepatitis C Testing is recommended for:  Everyone born from 1945 through 1965.  Anyone with known risk factors for hepatitis C. Sexually transmitted infections (STIs)  Get screened for STIs, including gonorrhea and chlamydia, if: ? You are sexually active and are younger than 54 years of age. ? You are older than 54 years of age and your health care provider tells you that you are at risk for this type of infection. ? Your sexual activity has changed since you were last screened, and you are at increased risk for chlamydia or gonorrhea. Ask your health care provider if   you are at risk.  Ask your health care provider about whether you are at high risk for HIV. Your health care provider may recommend a prescription medicine to help prevent HIV infection. If you choose to take medicine to prevent HIV, you should first get tested for HIV. You should then be tested every 3 months for as long as you are taking the medicine. Pregnancy  If you are about to stop having your period (premenopausal) and  you may become pregnant, seek counseling before you get pregnant.  Take 400 to 800 micrograms (mcg) of folic acid every day if you become pregnant.  Ask for birth control (contraception) if you want to prevent pregnancy. Osteoporosis and menopause Osteoporosis is a disease in which the bones lose minerals and strength with aging. This can result in bone fractures. If you are 65 years old or older, or if you are at risk for osteoporosis and fractures, ask your health care provider if you should:  Be screened for bone loss.  Take a calcium or vitamin D supplement to lower your risk of fractures.  Be given hormone replacement therapy (HRT) to treat symptoms of menopause. Follow these instructions at home: Lifestyle  Do not use any products that contain nicotine or tobacco, such as cigarettes, e-cigarettes, and chewing tobacco. If you need help quitting, ask your health care provider.  Do not use street drugs.  Do not share needles.  Ask your health care provider for help if you need support or information about quitting drugs. Alcohol use  Do not drink alcohol if: ? Your health care provider tells you not to drink. ? You are pregnant, may be pregnant, or are planning to become pregnant.  If you drink alcohol: ? Limit how much you use to 0-1 drink a day. ? Limit intake if you are breastfeeding.  Be aware of how much alcohol is in your drink. In the [place] one drink equals one 12 oz bottle of beer (355 mL), one 5 oz glass of wine (148 mL), or one 1 oz glass of hard liquor (44 mL). General instructions  Schedule regular health, dental, and eye exams.  Stay current with your vaccines.  Tell your health care provider if: ? You often feel depressed. ? You have ever been abused or do not feel safe at home. Summary  Adopting a healthy lifestyle and getting preventive care are important in promoting health and wellness.  Follow your health care provider's instructions about healthy  diet, exercising, and getting tested or screened for diseases.  Follow your health care provider's instructions on monitoring your cholesterol and blood pressure. This information is not intended to replace advice given to you by your health care provider. Make sure you discuss any questions you have with your health care provider. Document Released: 02/26/2011 Document Revised: 08/06/2018 Document Reviewed: 08/06/2018 Elsevier Patient Education  2020 [employer] Inc[employer]  

## 2019-07-13 NOTE — Progress Notes (Addendum)
Subjective:     Patient ID: [name] , female    DOB: December 22, 1965 , 53 y.o.   MRN: [id number]   Chief Complaint  Patient presents with  . Annual Exam    HPI  She is here today for a full physical examination. She is followed by Dr. Charlesetta Garibaldi for her GYN exams. She is s/p hysterectomy.      Past Medical History:  Diagnosis Date  . Allergy-induced asthma    no current med.  . Complication of anesthesia    states is hard to get to sleep and hard to wake up  . History of CVA (cerebrovascular accident) 2004   states no deficits  . Innocent heart murmur    per pt.  . Medial meniscus tear 08/2016   left knee  . Seasonal allergies      Family History  Problem Relation Age of Onset  . Hypertension Mother   . Diabetes Mother   . Hyperlipidemia Mother   . Pancreatic cancer Father   . Diabetes Sister   . Hypertension Sister   . Hyperlipidemia Sister      Current Outpatient Medications:  .  Calcium-Vitamin D-Vitamin K [id number] MG-UNT-MCG TABS, Take 1 tablet by mouth daily., Disp: , Rfl:  .  cetirizine (ZYRTEC) 10 MG tablet, Take 10 mg by mouth daily., Disp: , Rfl:  .  clonazePAM (KLONOPIN) 0.5 MG tablet, TK 1/2-1 T PO D PRN FOR ANXIETY, Disp: , Rfl:  .  fluticasone (FLONASE) 50 MCG/ACT nasal spray, Place 2 sprays into both nostrils daily. (Patient taking differently: Place 1 spray into both nostrils daily. ), Disp: 16 g, Rfl: 0 .  Multiple Vitamins-Minerals (MULTIVITAMIN WOMEN 50+ PO), Take 1 tablet by mouth daily., Disp: , Rfl:  .  PREVIDENT 5000 ENAMEL PROTECT 1.1-5 % PSTE, Take 1 application by mouth at bedtime., Disp: , Rfl:  .  rosuvastatin (CRESTOR) 20 MG tablet, TAKE 1/2 TABLET(10 MG) BY MOUTH DAILY, Disp: 30 tablet, Rfl: 2 .  sertraline (ZOLOFT) 100 MG tablet, TK 1 T PO D FOR ANXIETY OR DEPRESSION, Disp: , Rfl:  .  traZODone (DESYREL) 50 MG tablet, TK 1 TO 2 TS PO HS PRF SLP, Disp: , Rfl:  .  XIIDRA 5 % SOLN, Apply 1 drop to eye 2 (two) times daily. , Disp:  , Rfl: 3   Allergies  Allergen Reactions  . Tomato Anaphylaxis     Review of Systems  Constitutional: Negative.   HENT: Negative.   Eyes: Negative.   Respiratory: Negative.   Cardiovascular: Negative.   Endocrine: Negative.   Genitourinary: Negative.   Musculoskeletal: Negative.   Skin: Negative.   Allergic/Immunologic: Negative.   Neurological: Positive for numbness.       She c/o numbness right middle finger. She is unable to state what triggers her sx. She denies RUE weakness. She is followed by Dr. Marlou Sa. She reports having nerve conduction study. She is scheduled to have MRI.   Hematological: Negative.   Psychiatric/Behavioral: Negative.      Today's Vitals   07/13/19 0919  BP: 114/68  Pulse: 78  Temp: 98.8 F (37.1 C)  TempSrc: Oral  Weight: 139 lb 6.4 oz (63.2 kg)  Height: 5' 7.6" (1.717 m)   Body mass index is 21.45 kg/m.   Objective:  Physical Exam Vitals signs and nursing note reviewed.  Constitutional:      Appearance: Normal appearance.  HENT:     Head: Normocephalic and atraumatic.     Right  Ear: Tympanic membrane, ear canal and external ear normal.     Left Ear: Tympanic membrane, ear canal and external ear normal.     Nose: Nose normal.     Mouth/Throat:     Mouth: Mucous membranes are moist.     Pharynx: Oropharynx is clear.  Eyes:     Extraocular Movements: Extraocular movements intact.     Conjunctiva/sclera: Conjunctivae normal.     Pupils: Pupils are equal, round, and reactive to light.  Neck:     Musculoskeletal: Normal range of motion and neck supple.  Cardiovascular:     Rate and Rhythm: Normal rate and regular rhythm.     Pulses: Normal pulses.     Heart sounds: Normal heart sounds.  Pulmonary:     Effort: Pulmonary effort is normal.     Breath sounds: Normal breath sounds.  Chest:     Breasts: Tanner Score is 5.        Right: Normal.        Left: Normal.  Abdominal:     General: Abdomen is flat. Bowel sounds are normal.      Palpations: Abdomen is soft.  Genitourinary:    Comments: deferred Musculoskeletal: Normal range of motion.  Skin:    General: Skin is warm and dry.  Neurological:     General: No focal deficit present.     Mental Status: She is alert and oriented to person, place, and time.  Psychiatric:        Mood and Affect: Mood normal.        Behavior: Behavior normal.         Assessment And Plan:     1. Routine general medical examination at health care facility  A full exam was performed. Importance of monthly self breast exams was discussed with the patient.  She had her labs drawn two weeks ago, we went over her results in full detail during her visit.  PATIENT HAS BEEN ADVISED TO GET 30-45 MINUTES REGULAR EXERCISE NO LESS THAN FOUR TO FIVE DAYS PER WEEK - BOTH WEIGHTBEARING EXERCISES AND AEROBIC ARE RECOMMENDED.  SHE WAS ADVISED TO FOLLOW A HEALTHY DIET WITH AT LEAST SIX FRUITS/VEGGIES PER DAY, DECREASE INTAKE OF RED MEAT, AND TO INCREASE FISH INTAKE TO TWO DAYS PER WEEK.  MEATS/FISH SHOULD NOT BE FRIED, BAKED OR BROILED IS PREFERABLE.  I SUGGEST WEARING SPF 50 SUNSCREEN ON EXPOSED PARTS AND ESPECIALLY WHEN IN THE DIRECT SUNLIGHT FOR AN EXTENDED PERIOD OF TIME.  PLEASE AVOID FAST FOOD RESTAURANTS AND INCREASE YOUR WATER INTAKE.  2. Paresthesia of finger  She is followed by Ortho. She reports having upcoming MRI cervical spine. I will check vitamin B12 levels today.   - Vitamin B12  3. Vitamin D deficiency disease  I WILL CHECK A VIT D LEVEL AND SUPPLEMENT AS NEEDED.  ALSO ENCOURAGED TO SPEND 15 MINUTES IN THE SUN DAILY.  - Vitamin D (25 hydroxy)  4. Pure hypercholesterolemia  She was given refill of Rosuvastatin 10mg . Recent LDL is 100. She is encouraged to continue with her regular exercise regimen.       , MD    THE PATIENT IS ENCOURAGED TO PRACTICE SOCIAL DISTANCING DUE TO THE COVID-19 PANDEMIC.

## 2019-07-14 LAB — VITAMIN B12: Vitamin B-12: 793 pg/mL (ref 232–1245)

## 2019-07-14 LAB — VITAMIN D 25 HYDROXY (VIT D DEFICIENCY, FRACTURES): Vit D, 25-Hydroxy: 41.3 ng/mL (ref 30.0–100.0)

## 2019-07-22 ENCOUNTER — Encounter: Payer: Self-pay | Admitting: Internal Medicine

## 2019-07-22 DIAGNOSIS — F332 Major depressive disorder, recurrent severe without psychotic features: Secondary | ICD-10-CM | POA: Diagnosis not present

## 2019-07-22 DIAGNOSIS — F411 Generalized anxiety disorder: Secondary | ICD-10-CM | POA: Diagnosis not present

## 2019-07-25 ENCOUNTER — Other Ambulatory Visit: Payer: Self-pay

## 2019-07-25 ENCOUNTER — Ambulatory Visit
Admission: RE | Admit: 2019-07-25 | Discharge: 2019-07-25 | Disposition: A | Discharge status: Home / Self Care | Payer: Federal, State, Local not specified - PPO | Source: Ambulatory Visit | Attending: Orthopedic Surgery | Admitting: Orthopedic Surgery

## 2019-07-25 DIAGNOSIS — M5412 Radiculopathy, cervical region: Secondary | ICD-10-CM

## 2019-07-25 DIAGNOSIS — R202 Paresthesia of skin: Secondary | ICD-10-CM | POA: Diagnosis not present

## 2019-07-25 DIAGNOSIS — M50223 Other cervical disc displacement at C6-C7 level: Secondary | ICD-10-CM | POA: Diagnosis not present

## 2019-07-25 DIAGNOSIS — R200 Anesthesia of skin: Secondary | ICD-10-CM | POA: Diagnosis not present

## 2019-07-26 NOTE — Progress Notes (Signed)
Brain scan no ms pls calal thx

## 2019-07-29 ENCOUNTER — Ambulatory Visit: Payer: Federal, State, Local not specified - PPO | Admitting: Orthopedic Surgery

## 2019-08-05 DIAGNOSIS — F4323 Adjustment disorder with mixed anxiety and depressed mood: Secondary | ICD-10-CM | POA: Diagnosis not present

## 2019-08-05 DIAGNOSIS — F411 Generalized anxiety disorder: Secondary | ICD-10-CM | POA: Diagnosis not present

## 2019-08-05 DIAGNOSIS — F332 Major depressive disorder, recurrent severe without psychotic features: Secondary | ICD-10-CM | POA: Diagnosis not present

## 2019-09-02 DIAGNOSIS — Z03818 Encounter for observation for suspected exposure to other biological agents ruled out: Secondary | ICD-10-CM | POA: Diagnosis not present

## 2019-09-09 DIAGNOSIS — F411 Generalized anxiety disorder: Secondary | ICD-10-CM | POA: Diagnosis not present

## 2019-09-09 DIAGNOSIS — F332 Major depressive disorder, recurrent severe without psychotic features: Secondary | ICD-10-CM | POA: Diagnosis not present

## 2019-09-09 DIAGNOSIS — Z124 Encounter for screening for malignant neoplasm of cervix: Secondary | ICD-10-CM | POA: Diagnosis not present

## 2019-09-09 DIAGNOSIS — Z01419 Encounter for gynecological examination (general) (routine) without abnormal findings: Secondary | ICD-10-CM | POA: Diagnosis not present

## 2019-09-11 LAB — HM PAP SMEAR: HM Pap smear: NORMAL

## 2019-09-15 ENCOUNTER — Encounter: Payer: Self-pay | Admitting: Internal Medicine

## 2019-09-30 DIAGNOSIS — F411 Generalized anxiety disorder: Secondary | ICD-10-CM | POA: Diagnosis not present

## 2019-09-30 DIAGNOSIS — F332 Major depressive disorder, recurrent severe without psychotic features: Secondary | ICD-10-CM | POA: Diagnosis not present

## 2019-10-01 DIAGNOSIS — F332 Major depressive disorder, recurrent severe without psychotic features: Secondary | ICD-10-CM | POA: Diagnosis not present

## 2019-10-01 DIAGNOSIS — F4323 Adjustment disorder with mixed anxiety and depressed mood: Secondary | ICD-10-CM | POA: Diagnosis not present

## 2019-10-01 DIAGNOSIS — F411 Generalized anxiety disorder: Secondary | ICD-10-CM | POA: Diagnosis not present

## 2019-10-06 ENCOUNTER — Encounter: Payer: Self-pay | Admitting: Internal Medicine

## 2019-10-23 DIAGNOSIS — F332 Major depressive disorder, recurrent severe without psychotic features: Secondary | ICD-10-CM | POA: Diagnosis not present

## 2019-10-23 DIAGNOSIS — F411 Generalized anxiety disorder: Secondary | ICD-10-CM | POA: Diagnosis not present

## 2019-10-28 DIAGNOSIS — Z03818 Encounter for observation for suspected exposure to other biological agents ruled out: Secondary | ICD-10-CM | POA: Diagnosis not present

## 2019-11-12 DIAGNOSIS — F332 Major depressive disorder, recurrent severe without psychotic features: Secondary | ICD-10-CM | POA: Diagnosis not present

## 2019-11-12 DIAGNOSIS — F411 Generalized anxiety disorder: Secondary | ICD-10-CM | POA: Diagnosis not present

## 2019-12-09 DIAGNOSIS — Z03818 Encounter for observation for suspected exposure to other biological agents ruled out: Secondary | ICD-10-CM | POA: Diagnosis not present

## 2019-12-09 DIAGNOSIS — F411 Generalized anxiety disorder: Secondary | ICD-10-CM | POA: Diagnosis not present

## 2019-12-09 DIAGNOSIS — F332 Major depressive disorder, recurrent severe without psychotic features: Secondary | ICD-10-CM | POA: Diagnosis not present

## 2019-12-15 DIAGNOSIS — F332 Major depressive disorder, recurrent severe without psychotic features: Secondary | ICD-10-CM | POA: Diagnosis not present

## 2019-12-15 DIAGNOSIS — F411 Generalized anxiety disorder: Secondary | ICD-10-CM | POA: Diagnosis not present

## 2019-12-18 ENCOUNTER — Encounter: Payer: Self-pay | Admitting: Cardiovascular Disease

## 2019-12-18 ENCOUNTER — Ambulatory Visit: Payer: Federal, State, Local not specified - PPO | Admitting: Cardiovascular Disease

## 2019-12-18 ENCOUNTER — Other Ambulatory Visit: Payer: Self-pay

## 2019-12-18 VITALS — BP 116/76 | HR 58 | Ht 68.5 in | Wt 149.8 lb

## 2019-12-18 DIAGNOSIS — Z8249 Family history of ischemic heart disease and other diseases of the circulatory system: Secondary | ICD-10-CM | POA: Diagnosis not present

## 2019-12-18 DIAGNOSIS — R002 Palpitations: Secondary | ICD-10-CM

## 2019-12-18 DIAGNOSIS — R079 Chest pain, unspecified: Secondary | ICD-10-CM

## 2019-12-18 DIAGNOSIS — R0789 Other chest pain: Secondary | ICD-10-CM | POA: Diagnosis not present

## 2019-12-18 NOTE — Patient Instructions (Signed)
Medication Instructions:  Your physician recommends that you continue on your current medications as directed. Please refer to the Current Medication list given to you today.  *If you need a refill on your cardiac medications before your next appointment, please call your pharmacy*  Lab Work: NONE   Testing/Procedures: Your physician has requested that you have an echocardiogram. Echocardiography is a painless test that uses sound waves to create images of your heart. It provides your doctor with information about the size and shape of your heart and how well your heart's chambers and valves are working. This procedure takes approximately one hour. There are no restrictions for this procedure. [facility]   Follow-Up: At [facility], you and your health needs are our priority.  As part of our continuing mission to provide you with exceptional heart care, we have created designated Provider Care Teams.  These Care Teams include your primary Cardiologist (physician) and Advanced Practice Providers (APPs -  Physician Assistants and Nurse Practitioners) who all work together to provide you with the care you need, when you need it.  We recommend signing up for the patient portal called "MyChart".  Sign up information is provided on this After Visit Summary.  MyChart is used to connect with patients for Virtual Visits (Telemedicine).  Patients are able to view lab/test results, encounter notes, upcoming appointments, etc.  Non-urgent messages can be sent to your provider as well.   To learn more about what you can do with MyChart, go to [email or web address].    Your next appointment:   4-6 week(s)  The format for your next appointment:   Virtual Visit   Provider:   You may see Skeet Latch, MD   or one of the following Advanced Practice Providers on your designated Care Team:    Kerin Ransom, PA-C  [place], [place]  Coletta Memos, [place]  Other  Instructions  Echocardiogram An echocardiogram is a procedure that uses painless sound waves (ultrasound) to produce an image of the heart. Images from an echocardiogram can provide important information about:  Signs of coronary artery disease (CAD).  Aneurysm detection. An aneurysm is a weak or damaged part of an artery wall that bulges out from the normal force of blood pumping through the body.  Heart size and shape. Changes in the size or shape of the heart can be associated with certain conditions, including heart failure, aneurysm, and CAD.  Heart muscle function.  Heart valve function.  Signs of a past heart attack.  Fluid buildup around the heart.  Thickening of the heart muscle.  A tumor or infectious growth around the heart valves. Tell a health care provider about:  Any allergies you have.  All medicines you are taking, including vitamins, herbs, eye drops, creams, and over-the-counter medicines.  Any blood disorders you have.  Any surgeries you have had.  Any medical conditions you have.  Whether you are pregnant or may be pregnant. What are the risks? Generally, this is a safe procedure. However, problems may occur, including:  Allergic reaction to dye (contrast) that may be used during the procedure. What happens before the procedure? No specific preparation is needed. You may eat and drink normally. What happens during the procedure?   An IV tube may be inserted into one of your veins.  You may receive contrast through this tube. A contrast is an injection that improves the quality of the pictures from your heart.  A gel will be  applied to your chest.  A wand-like tool (transducer) will be moved over your chest. The gel will help to transmit the sound waves from the transducer.  The sound waves will harmlessly bounce off of your heart to allow the heart images to be captured in real-time motion. The images will be recorded on a computer. The  procedure may vary among health care providers and hospitals. What happens after the procedure?  You may return to your normal, everyday life, including diet, activities, and medicines, unless your health care provider tells you not to do that. Summary  An echocardiogram is a procedure that uses painless sound waves (ultrasound) to produce an image of the heart.  Images from an echocardiogram can provide important information about the size and shape of your heart, heart muscle function, heart valve function, and fluid buildup around your heart.  You do not need to do anything to prepare before this procedure. You may eat and drink normally.  After the echocardiogram is completed, you may return to your normal, everyday life, unless your health care provider tells you not to do that. This information is not intended to replace advice given to you by your health care provider. Make sure you discuss any questions you have with your health care provider. Document Revised: 12/04/2018 Document Reviewed: 09/15/2016 Elsevier Patient Education  2020 [employer].

## 2019-12-18 NOTE — Progress Notes (Signed)
Cardiology Office Note   Date:  12/18/2019   ID:  Kathryn, Wood 1966/06/10, MRN [id number]  PCP:  Glendale Chard, MD  Cardiologist:   Skeet Latch, MD   No chief complaint on file.    History of Present Illness: [name] is a 54 y.o. female with prior TIA and hyperlipidemia who presents for an evaluation of palpitations.  [name] saw Dr. Baird Cancer on 03/2017 and noted intermittent palpitations.  EKG was unremarkable at that time.  She was referred to cardiology for further evaluation.  She had a 30-day event monitor 05/2017 that revealed sinus rhythm and no arrhythmias.  She reported chest pain at which time sinus rhythm was noted.  [name] reports having a TIA in 2002.  She notes that her speech was impaired for months. She also lost the ability to write with her right hand. 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 Records are no longer available, but she was reported to care for at [facility] in [place]. She notes no source of her symptoms with ever found on brain imaging.  She notes being told that her EKG is always abnormal.  She was also told that she had an innocent murmur.  She was seen in the [facility] ED 11/2015 after a panic attack.  She had a LHC that showed normal coronary arteries.   Since her last appointment [name] has been feeling well.  She has rare episodes of heart racing and chest tightness.  She otherwise feels well.  She walks 5-6 miles daily.  She feels good with exercise.  She has no lower extremity edema, orthopnea or PND.  She had an episode of waking up in her sleep with her heart racing that hasn't occurred in a long time.  It seems to occur more when she is feeling stressed and anxious.  Since her last appointment she found out that her sister has hypertrophic cardiomyopathy.  Her nephew had sudden cardiac death while playing basketball.  He had a  defibrillator implanted.  She thinks that he has had subsequent genetic testing but is unsure.  He had a stroke last year in the setting of COVID-19 pneumonia.  While her sister was in the hospital recently she found that she also has hypertrophic cardiomyopathy.  Her cardiologist recommended that all first-degree relatives be tested.  She has not had any lower extremity edema, orthopnea, or PND.  She denies any syncope or presyncope.   Past Medical History:  Diagnosis Date  . Allergy-induced asthma    no current med.  . Complication of anesthesia    states is hard to get to sleep and hard to wake up  . History of CVA (cerebrovascular accident) 2004   states no deficits  . Innocent heart murmur    per pt.  . Medial meniscus tear 08/2016   left knee  . Seasonal allergies     Past Surgical History:  Procedure Laterality Date  . ABDOMINAL HYSTERECTOMY    . CARDIAC CATHETERIZATION N/A 11/28/2015   Procedure: Left Heart Cath and Coronary Angiography;  Surgeon: Adrian Prows, MD;  Location: [place] [facility];  Service: Cardiovascular;  Laterality: N/A;  . CYSTOSCOPY  09/29/2007  . KNEE ARTHROSCOPY Right 05/2016  . KNEE ARTHROSCOPY Left 1994  . KNEE ARTHROSCOPY Left 09/05/2016   Procedure: ARTHROSCOPY KNEE WITH PLICAECTOMY;  Surgeon: Frederik Pear, MD;  Location: [place];  Service: Orthopedics;  Laterality: Left;  . LAPAROSCOPIC HYSTERECTOMY  09/29/2007   partial  . TUBAL LIGATION     x 2     Current Outpatient Medications  Medication Sig Dispense Refill  . Calcium-Vitamin D-Vitamin K [id number] MG-UNT-MCG TABS Take 1 tablet by mouth daily.    . cetirizine (ZYRTEC) 10 MG tablet Take 10 mg by mouth daily.    . fluticasone (FLONASE) 50 MCG/ACT nasal spray Place 2 sprays into both nostrils daily. (Patient taking differently: Place 1 spray into both nostrils daily. ) 16 g 0  . Multiple Vitamins-Minerals (MULTIVITAMIN WOMEN 50+ PO) Take 1 tablet by mouth daily.    . Omega-3  Fatty Acids (FISH OIL) 1000 MG CAPS Take by mouth.    Marland Kitchen PREVIDENT 5000 ENAMEL PROTECT 1.1-5 % PSTE Take 1 application by mouth at bedtime.    . rosuvastatin (CRESTOR) 10 MG tablet Take 1 tablet (10 mg total) by mouth at bedtime. 90 tablet 2  . traZODone (DESYREL) 50 MG tablet TK 1 TO 2 TS PO HS PRF SLP    . XIIDRA 5 % SOLN Apply 1 drop to eye 2 (two) times daily.   3   No current facility-administered medications for this visit.    Allergies:   Tomato    Social History:  The patient  reports that she has never smoked. She has never used smokeless tobacco. She reports current alcohol use. She reports that she does not use drugs.   Family History:  The patient's family history includes Diabetes in her mother and sister; Hyperlipidemia in her mother and sister; Hypertension in her mother and sister; Pancreatic cancer in her father.    ROS:  Please see the history of present illness.   Otherwise, review of systems are positive for none.   All other systems are reviewed and negative.    PHYSICAL EXAM: VS:  BP 116/76   Pulse (!) 58   Ht 5' 8.5" (1.74 m)   Wt 149 lb 12.8 oz (67.9 kg)   SpO2 99%   BMI 22.45 kg/m  , BMI Body mass index is 22.45 kg/m. GENERAL:  Well appearing HEENT: Pupils equal round and reactive, fundi not visualized, oral mucosa unremarkable NECK:  No jugular venous distention, waveform within normal limits, carotid upstroke brisk and symmetric, no bruits, no thyromegaly LYMPHATICS:  No cervical adenopathy LUNGS:  Clear to auscultation bilaterally HEART:  RRR.  PMI not displaced or sustained,S1 and S2 within normal limits, no S3, no S4, no clicks, no rubs, no murmurs ABD:  Flat, positive bowel sounds normal in frequency in pitch, no bruits, no rebound, no guarding, no midline pulsatile mass, no hepatomegaly, no splenomegaly EXT:  2 plus pulses throughout, no edema, no cyanosis no clubbing SKIN:  No rashes no nodules NEURO:  Cranial nerves II through XII grossly intact,  motor grossly intact throughout PSYCH:  Cognitively intact, oriented to person place and time    EKG:  EKG is ordered today. The ekg ordered today demonstrates sinus rhythm. Rate 72 bpm. Diffuse T-wave inversions. 12/18/2019: Sinus bradycardia.  Rate 58 bpm.  Nonspecific T wave abnormalities.  Unchanged from prior.  30 Day Event Monitor 06/10/17: Quality: Fair.  Baseline artifact. Predominant rhythm: sinus rhythm Average heart rate: 74 bpm Max heart rate: 141 bpm Min heart rate: 41 bpm  Patient reported chest pain, at which time sinus rhythm was noted.  Recent Labs: 06/29/2019: ALT 18; BUN 17; Creatinine, Ser 0.76; Hemoglobin 12.1; Platelets 320; Potassium 4.7; Sodium 139  Lipid Panel    Component Value Date/Time   CHOL 177 06/29/2019 1006   TRIG 56 06/29/2019 1006   HDL 66 06/29/2019 1006   CHOLHDL 2.7 06/29/2019 1006   CHOLHDL 3.4 11/28/2015 1200   VLDL 22 11/28/2015 1200   LDLCALC 100 (H) 06/29/2019 1006   04/01/17:  Sodium 143, potassium 4.4, BUN 13, creatinine 0.86+ AST 19, ALT 17 WBC 5, hemoglobin 12.0, hematocrit 37.3, platelets 337 TSH 1.46 Hemoglobin A1c 5.9%    Wt Readings from Last 3 Encounters:  12/18/19 149 lb 12.8 oz (67.9 kg)  07/13/19 139 lb 6.4 oz (63.2 kg)  07/07/18 159 lb 9.6 oz (72.4 kg)      ASSESSMENT AND PLAN:  # FH of hypertrophic cardiomyopathy: No evidence of heart failure on exam.  She does have palpitations but there is no significant arrhythmia on ambulatory monitor.  We will plan to get an echocardiogram.  She will follow up with her family to see if there is any specific genetic abnormality that has been identified.  # Palpitations:  No arrhythmias on event monitor.   Findings are pertinent given concern for hypertrophic cardiomyopathy in her family.  # Chest pain: Symptoms are very atypical.  She had no obstructive coronary disease on cath and ambulatory monitor was unremarkable.  If anything they seem to be improving and she has  no chest pain with her for 5 mile walks daily.  No plan for repeat ischemia evaluation at this time.  # TIA: # Hyperlipidemia:   Continue rosuvastatin.    Current medicines are reviewed at length with the patient today.  The patient does not have concerns regarding medicines.  The following changes have been made: None  Labs/ tests ordered today include:   Orders Placed This Encounter  Procedures  . EKG 12-Lead  . ECHOCARDIOGRAM COMPLETE     Disposition:   FU with Brooklyn Alfredo C. Duke Salvia, MD, [facility] in 1 month   This note was written with the assistance of speech recognition software.  Please excuse any transcriptional errors.  Signed, Marycatherine Maniscalco C. Duke Salvia, MD, [facility]  12/18/2019 5:11 PM    [facility]

## 2019-12-23 ENCOUNTER — Other Ambulatory Visit: Payer: Self-pay | Admitting: Internal Medicine

## 2019-12-23 DIAGNOSIS — Z1231 Encounter for screening mammogram for malignant neoplasm of breast: Secondary | ICD-10-CM

## 2019-12-30 DIAGNOSIS — F411 Generalized anxiety disorder: Secondary | ICD-10-CM | POA: Diagnosis not present

## 2019-12-30 DIAGNOSIS — F332 Major depressive disorder, recurrent severe without psychotic features: Secondary | ICD-10-CM | POA: Diagnosis not present

## 2019-12-31 DIAGNOSIS — Z20828 Contact with and (suspected) exposure to other viral communicable diseases: Secondary | ICD-10-CM | POA: Diagnosis not present

## 2019-12-31 DIAGNOSIS — Z7189 Other specified counseling: Secondary | ICD-10-CM | POA: Diagnosis not present

## 2019-12-31 DIAGNOSIS — Z03818 Encounter for observation for suspected exposure to other biological agents ruled out: Secondary | ICD-10-CM | POA: Diagnosis not present

## 2020-01-11 ENCOUNTER — Other Ambulatory Visit: Payer: Self-pay

## 2020-01-11 ENCOUNTER — Ambulatory Visit (HOSPITAL_COMMUNITY): Payer: Federal, State, Local not specified - PPO | Attending: Cardiology

## 2020-01-11 DIAGNOSIS — R079 Chest pain, unspecified: Secondary | ICD-10-CM | POA: Diagnosis not present

## 2020-01-11 DIAGNOSIS — M25512 Pain in left shoulder: Secondary | ICD-10-CM | POA: Diagnosis not present

## 2020-01-11 DIAGNOSIS — Z8249 Family history of ischemic heart disease and other diseases of the circulatory system: Secondary | ICD-10-CM | POA: Insufficient documentation

## 2020-01-13 DIAGNOSIS — F411 Generalized anxiety disorder: Secondary | ICD-10-CM | POA: Diagnosis not present

## 2020-01-13 DIAGNOSIS — F332 Major depressive disorder, recurrent severe without psychotic features: Secondary | ICD-10-CM | POA: Diagnosis not present

## 2020-01-20 DIAGNOSIS — Z03818 Encounter for observation for suspected exposure to other biological agents ruled out: Secondary | ICD-10-CM | POA: Diagnosis not present

## 2020-01-26 ENCOUNTER — Ambulatory Visit
Admission: RE | Admit: 2020-01-26 | Discharge: 2020-01-26 | Disposition: A | Discharge status: Home / Self Care | Payer: Federal, State, Local not specified - PPO | Source: Ambulatory Visit

## 2020-01-26 ENCOUNTER — Other Ambulatory Visit: Payer: Self-pay

## 2020-01-26 DIAGNOSIS — Z1231 Encounter for screening mammogram for malignant neoplasm of breast: Secondary | ICD-10-CM | POA: Diagnosis not present

## 2020-02-03 DIAGNOSIS — Z03818 Encounter for observation for suspected exposure to other biological agents ruled out: Secondary | ICD-10-CM | POA: Diagnosis not present

## 2020-02-04 ENCOUNTER — Telehealth (INDEPENDENT_AMBULATORY_CARE_PROVIDER_SITE_OTHER): Payer: Federal, State, Local not specified - PPO | Admitting: Cardiovascular Disease

## 2020-02-04 ENCOUNTER — Encounter: Payer: Self-pay | Admitting: Cardiovascular Disease

## 2020-02-04 VITALS — Ht 68.0 in | Wt 140.0 lb

## 2020-02-04 DIAGNOSIS — R0789 Other chest pain: Secondary | ICD-10-CM

## 2020-02-04 DIAGNOSIS — R002 Palpitations: Secondary | ICD-10-CM

## 2020-02-04 NOTE — Progress Notes (Signed)
Virtual Visit via Video Note   This visit type was conducted due to national recommendations for restrictions regarding Kathryn COVID-19 Pandemic (e.g. social distancing) in an effort to limit this [name]'s exposure and mitigate transmission in our community.  Due to [name] co-morbid illnesses, this [name] is at least at moderate risk for complications without adequate follow up.  This format is felt to be most appropriate for this [name] at this time.  All issues noted in this document were discussed and addressed.  A limited physical exam was performed with this format.  Please refer to [name]'s chart for [name] consent to telehealth for [facility].   [name] was identified using 2 identifiers.  Date:  02/04/2020   ID:  [name], DOB 06-Jul-1966, MRN [id number]  [name] Location: Home Provider Location: Office  PCP:  [name], MD  Cardiologist:  Chilton Si, MD  Electrophysiologist:  None   Evaluation Performed:  Follow-Up Visit  Chief Complaint:    History of Present Illness:    [name] is a 54 y.o. female with prior TIA and hyperlipidemia here for follow-up.  [name] was initially seen 05/2017 for an evaluation of palpitations.  [name] saw Dr. Allyne Gee on 03/2017 and noted intermittent palpitations.  EKG was unremarkable at that time.  [name] was referred to cardiology for further evaluation.  [name] had a 30-day event monitor 05/2017 that revealed sinus rhythm and no arrhythmias.  [name] reported chest pain at which time sinus rhythm was noted.  [name] reports having a TIA in 2002.  [name] notes that [name] speech was impaired for months. [name] also lost Kathryn ability to write with [name] right hand. [name] had paralysis of bilateral lower extremities extreme weakness of [name] right hand. Records are no longer available, but [name] was reported to care for at [facility] in [place]. [name] notes no source of [name] symptoms with ever found on brain  imaging.  [name] notes being told that [name] EKG is always abnormal.  [name] was also told that [name] had an innocent murmur.  [name] was seen in Kathryn [facility] ED 11/2015 after a panic attack.  [name] had a LHC that showed normal coronary arteries.   At [name] last appointment [name] found out that [name] sister has hypertrophic cardiomyopathy.  [name] nephew had sudden cardiac death while playing basketball.  He had a defibrillator implanted.  [name] thinks that he has had subsequent genetic testing but is unsure.  He had a stroke last year in Kathryn setting of COVID-19 pneumonia.  While [name] sister was in Kathryn hospital recently [name] found that [name] also has hypertrophic cardiomyopathy.  [name] cardiologist recommended that all first-degree relatives be tested.  [name] was asymptomatic at Kathryn time and exercising regularly.  Since [name] last appointment [name] has been walking at least 6 miles daily.  [name] bought a bike and plans to start using that too.  [name] has problems with [name] knee but no exertional symptoms.  [name] denies chest pain or shortness of breath.  [name] had a couple episodes of palpitations that lasted 15 minutes while at rest.  [name] hasn't had any exertional symptoms.   Echo personally reviewed.  By report [name] posterior wall was 1.4 cm and Kathryn septal wall was 1.2 cm.  Kathryn written report says no LVH.  I reviewed [name] echocardiogram and agree that [name] does not have LVH.  [name] septal wall was 0.94 cm in Kathryn posterior wall is 1.05 cm.  [name] does not have symptoms concerning for COVID-19 infection (fever, chills, cough, or new shortness of breath).    Past Medical History:  Diagnosis Date  . Allergy-induced asthma    no current med.  . Complication of anesthesia    states is hard to get to sleep and hard to wake up  . History of CVA (cerebrovascular accident) 2004   states no deficits  . Innocent heart murmur    per pt.  . Medial meniscus tear 08/2016   left knee  . Seasonal allergies     Past Surgical History:  Procedure  Laterality Date  . ABDOMINAL HYSTERECTOMY    . CARDIAC CATHETERIZATION N/A 11/28/2015   Procedure: Left Heart Cath and Coronary Angiography;  Surgeon: Adrian Prows, MD;  Location: [place] [facility];  Service: Cardiovascular;  Laterality: N/A;  . CYSTOSCOPY  09/29/2007  . KNEE ARTHROSCOPY Right 05/2016  . KNEE ARTHROSCOPY Left 1994  . KNEE ARTHROSCOPY Left 09/05/2016   Procedure: ARTHROSCOPY KNEE WITH PLICAECTOMY;  Surgeon: Frederik Pear, MD;  Location: [place];  Service: Orthopedics;  Laterality: Left;  . LAPAROSCOPIC HYSTERECTOMY  09/29/2007   partial  . TUBAL LIGATION     x 2     Current Outpatient Medications  Medication Sig Dispense Refill  . Calcium-Vitamin D-Vitamin K [id number] MG-UNT-MCG TABS Take 1 tablet by mouth daily.    . cetirizine (ZYRTEC) 10 MG tablet Take 10 mg by mouth daily.    . fluticasone (FLONASE) 50 MCG/ACT nasal spray Place 1 spray into both nostrils in Kathryn morning and at bedtime.    . Multiple Vitamins-Minerals (MULTIVITAMIN WOMEN 50+ PO) Take 1 tablet by mouth daily.    . Omega-3 Fatty Acids (FISH OIL) 1000 MG CAPS Take by mouth.    Marland Kitchen PREVIDENT 5000 ENAMEL PROTECT 1.1-5 % PSTE Take 1 application by mouth at bedtime.    . rosuvastatin (CRESTOR) 10 MG tablet Take 1 tablet (10 mg total) by mouth at bedtime. 90 tablet 2  . XIIDRA 5 % SOLN Apply 1 drop to eye 2 (two) times daily.   3   No current facility-administered medications for this visit.    Allergies:   Tomato    Social History:  [name]  reports that [name] has never smoked. [name] has never used smokeless tobacco. [name] reports current alcohol use. [name] reports that [name] does not use drugs.   Family History:  [name]'s family history includes Diabetes in [name] mother and sister; Hyperlipidemia in [name] mother and sister; Hypertension in [name] mother and sister; Pancreatic cancer in [name] father.    ROS:  Please see Kathryn history of present illness.   Otherwise, review of systems are positive for  none.   All other systems are reviewed and negative.    PHYSICAL EXAM: Ht 5\' 8"  (1.727 m)   Wt 140 lb (63.5 kg)   BMI 21.29 kg/m  GENERAL: Well-appearing.  No acute distress. HEENT: Pupils equal round.  Oral mucosa unremarkable NECK:  No jugular venous distention, no visible thyromegaly EXT:  No edema, no cyanosis no clubbing SKIN:  No rashes no nodules NEURO:  Speech fluent.  Cranial nerves grossly intact.  Moves all 4 extremities freely PSYCH:  Cognitively intact, oriented to person place and time   EKG:  EKG is ordered today. Kathryn ekg ordered today demonstrates sinus rhythm. Rate 72 bpm. Diffuse T-wave inversions. 12/18/2019: Sinus bradycardia.  Rate 58 bpm.  Nonspecific T wave abnormalities.  Unchanged from prior.  30 Day Event Monitor 06/10/17: Quality: Fair.  Baseline artifact. Predominant rhythm: sinus rhythm Average heart rate: 74 bpm Max heart rate: 141 bpm Min heart rate: 41 bpm  [name] reported chest pain, at which time sinus rhythm was noted.  Echo 01/11/20:   1. Normal LV function; trace MR; mild TR.  2. Left ventricular ejection fraction, by estimation, is 55 to 60%. Kathryn  left ventricle has normal function. Kathryn left ventricle has no regional  wall motion abnormalities. Left ventricular diastolic parameters were  normal.  3. Right ventricular systolic function is normal. Kathryn right ventricular  size is normal. There is normal pulmonary artery systolic pressure.  4. Kathryn mitral valve is normal in structure. Trivial mitral valve  regurgitation. No evidence of mitral stenosis.  5. Kathryn aortic valve is tricuspid. Aortic valve regurgitation is not  visualized. No aortic stenosis is present.  6. Kathryn inferior vena cava is normal in size with greater than 50%  respiratory variability, suggesting right atrial pressure of 3 mmHg.   Recent Labs: 06/29/2019: ALT 18; BUN 17; Creatinine, Ser 0.76; Hemoglobin 12.1; Platelets 320; Potassium 4.7; Sodium 139    Lipid  Panel    Component Value Date/Time   CHOL 177 06/29/2019 1006   TRIG 56 06/29/2019 1006   HDL 66 06/29/2019 1006   CHOLHDL 2.7 06/29/2019 1006   CHOLHDL 3.4 11/28/2015 1200   VLDL 22 11/28/2015 1200   LDLCALC 100 (H) 06/29/2019 1006   04/01/17:  Sodium 143, potassium 4.4, BUN 13, creatinine 0.86+ AST 19, ALT 17 WBC 5, hemoglobin 12.0, hematocrit 37.3, platelets 337 TSH 1.46 Hemoglobin A1c 5.9%    Wt Readings from Last 3 Encounters:  02/04/20 140 lb (63.5 kg)  12/18/19 149 lb 12.8 oz (67.9 kg)  07/13/19 139 lb 6.4 oz (63.2 kg)     ASSESSMENT AND PLAN:  # FH of hypertrophic cardiomyopathy: Echo revealed normal [name] does not have any evidence of hypertrophic cardiomyopathy.  # Palpitations:  No arrhythmias on event monitor.   No palpitations recently.  # Chest pain: No chest pain recently and [name] exercises daily.  [name] had no obstructive coronary disease on cath and ambulatory monitor was unremarkable.   # TIA: # Hyperlipidemia:  Continue rosuvastatin.  Keep up Kathryn exercise.    Current medicines are reviewed at length with [name] today.  [name] does not have concerns regarding medicines.  Kathryn following changes have been made: None  Labs/ tests ordered today include:   No orders of Kathryn defined types were placed in this encounter.   Time:   Today, I have spent 1 minutes with [name] with telehealth technology discussing Kathryn above problems.    Disposition:   FU with Anayla Giannetti C. Duke Salvia, MD, [facility] in 1 month   This note was written with Kathryn assistance of speech recognition software.  Please excuse any transcriptional errors.  Signed, Manasvi Dickard C. Duke Salvia, MD, [facility]  02/04/2020 2:24 PM    [facility]

## 2020-02-04 NOTE — Patient Instructions (Signed)
Medication Instructions:  Your physician recommends that you continue on your current medications as directed. Please refer to the Current Medication list given to you today.  *If you need a refill on your cardiac medications before your next appointment, please call your pharmacy*  Lab Work: NONE  Testing/Procedures: NONE  Follow-Up: At [facility], you and your health needs are our priority.  As part of our continuing mission to provide you with exceptional heart care, we have created designated Provider Care Teams.  These Care Teams include your primary Cardiologist (physician) and Advanced Practice Providers (APPs -  Physician Assistants and Nurse Practitioners) who all work together to provide you with the care you need, when you need it.  We recommend signing up for the patient portal called "MyChart".  Sign up information is provided on this After Visit Summary.  MyChart is used to connect with patients for Virtual Visits (Telemedicine).  Patients are able to view lab/test results, encounter notes, upcoming appointments, etc.  Non-urgent messages can be sent to your provider as well.   To learn more about what you can do with MyChart, go to [email or web address].    Your next appointment:   12 month(s)  The format for your next appointment:   In Person  Provider:   You may see Tiffany [place], MD or one of the following Advanced Practice Providers on your designated Care Team:    Luke Kilroy, PA-C  Callie Goodrich, PA-C  Jesse Cleaver, FNP     

## 2020-02-08 DIAGNOSIS — M25512 Pain in left shoulder: Secondary | ICD-10-CM | POA: Diagnosis not present

## 2020-02-10 DIAGNOSIS — F332 Major depressive disorder, recurrent severe without psychotic features: Secondary | ICD-10-CM | POA: Diagnosis not present

## 2020-02-10 DIAGNOSIS — F411 Generalized anxiety disorder: Secondary | ICD-10-CM | POA: Diagnosis not present

## 2020-02-11 DIAGNOSIS — Z03818 Encounter for observation for suspected exposure to other biological agents ruled out: Secondary | ICD-10-CM | POA: Diagnosis not present

## 2020-02-16 ENCOUNTER — Ambulatory Visit: Payer: Federal, State, Local not specified - PPO | Admitting: Podiatry

## 2020-02-16 ENCOUNTER — Other Ambulatory Visit: Payer: Self-pay

## 2020-02-16 VITALS — Temp 96.2°F

## 2020-02-16 DIAGNOSIS — M21621 Bunionette of right foot: Secondary | ICD-10-CM

## 2020-02-16 DIAGNOSIS — Q828 Other specified congenital malformations of skin: Secondary | ICD-10-CM | POA: Diagnosis not present

## 2020-02-16 NOTE — Progress Notes (Signed)
She presents today for follow-up of her subfifth metatarsal head pain.  States that the callus was really hurting now but it feels like of stepping on a protruding bone.  She states that just hurts all the time pain level is an 8 out of 10 most days I can barely step up my foot.  ROS: Denies fever chills nausea vomiting muscle aches pains calf pain back pain chest pain shortness of breath.  Objective: Vital signs are stable alert and oriented x3.  Pulses are palpable.  Neurologic sensorium is intact degenerative flexors are intact she has a palpable fifth metatarsal head that is very prominent plantarly resulting in an area of reactive hyperkeratosis.  Mild tailor's bunion deformities noted.  She also has the hammertoe deformity fifth right adductovarus rotation.  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  Plan: Discussed etiology pathology conservative surgical therapies at this point in time went ahead and consented her today for fifth metatarsal osteotomy derotational arthroplasty fifth toe right foot.  She understands this is amenable to it we did discuss the possible postop complications which may include but not limited to postop pain bleeding swelling infection recurrence need for further surgery overcorrection under correction loss of digit loss of limb loss of life.  Dispensed information regarding the surgery center anesthesia group and a cam boot she also received information for the morning of surgery.  She was sent to scheduling and we will follow-up with her in the near future.

## 2020-02-22 DIAGNOSIS — U071 COVID-19: Secondary | ICD-10-CM | POA: Diagnosis not present

## 2020-02-25 DIAGNOSIS — Z03818 Encounter for observation for suspected exposure to other biological agents ruled out: Secondary | ICD-10-CM | POA: Diagnosis not present

## 2020-03-02 DIAGNOSIS — F332 Major depressive disorder, recurrent severe without psychotic features: Secondary | ICD-10-CM | POA: Diagnosis not present

## 2020-03-02 DIAGNOSIS — F411 Generalized anxiety disorder: Secondary | ICD-10-CM | POA: Diagnosis not present

## 2020-03-14 DIAGNOSIS — H16223 Keratoconjunctivitis sicca, not specified as Sjogren's, bilateral: Secondary | ICD-10-CM | POA: Diagnosis not present

## 2020-03-14 DIAGNOSIS — H04123 Dry eye syndrome of bilateral lacrimal glands: Secondary | ICD-10-CM | POA: Diagnosis not present

## 2020-03-14 DIAGNOSIS — H31093 Other chorioretinal scars, bilateral: Secondary | ICD-10-CM | POA: Diagnosis not present

## 2020-03-14 DIAGNOSIS — H2511 Age-related nuclear cataract, right eye: Secondary | ICD-10-CM | POA: Diagnosis not present

## 2020-03-15 DIAGNOSIS — U071 COVID-19: Secondary | ICD-10-CM | POA: Diagnosis not present

## 2020-03-16 DIAGNOSIS — F332 Major depressive disorder, recurrent severe without psychotic features: Secondary | ICD-10-CM | POA: Diagnosis not present

## 2020-03-16 DIAGNOSIS — Z7189 Other specified counseling: Secondary | ICD-10-CM | POA: Diagnosis not present

## 2020-03-16 DIAGNOSIS — F411 Generalized anxiety disorder: Secondary | ICD-10-CM | POA: Diagnosis not present

## 2020-03-16 DIAGNOSIS — Z20828 Contact with and (suspected) exposure to other viral communicable diseases: Secondary | ICD-10-CM | POA: Diagnosis not present

## 2020-03-30 DIAGNOSIS — F411 Generalized anxiety disorder: Secondary | ICD-10-CM | POA: Diagnosis not present

## 2020-03-30 DIAGNOSIS — F332 Major depressive disorder, recurrent severe without psychotic features: Secondary | ICD-10-CM | POA: Diagnosis not present

## 2020-04-01 DIAGNOSIS — Z03818 Encounter for observation for suspected exposure to other biological agents ruled out: Secondary | ICD-10-CM | POA: Diagnosis not present

## 2020-04-14 DIAGNOSIS — F411 Generalized anxiety disorder: Secondary | ICD-10-CM | POA: Diagnosis not present

## 2020-04-14 DIAGNOSIS — F332 Major depressive disorder, recurrent severe without psychotic features: Secondary | ICD-10-CM | POA: Diagnosis not present

## 2020-04-21 ENCOUNTER — Other Ambulatory Visit: Payer: Self-pay | Admitting: Internal Medicine

## 2020-04-27 DIAGNOSIS — F411 Generalized anxiety disorder: Secondary | ICD-10-CM | POA: Diagnosis not present

## 2020-04-27 DIAGNOSIS — F332 Major depressive disorder, recurrent severe without psychotic features: Secondary | ICD-10-CM | POA: Diagnosis not present

## 2020-05-05 ENCOUNTER — Encounter: Payer: Federal, State, Local not specified - PPO | Admitting: Podiatry

## 2020-05-10 ENCOUNTER — Telehealth: Payer: Self-pay

## 2020-05-10 DIAGNOSIS — F332 Major depressive disorder, recurrent severe without psychotic features: Secondary | ICD-10-CM | POA: Diagnosis not present

## 2020-05-10 DIAGNOSIS — F411 Generalized anxiety disorder: Secondary | ICD-10-CM | POA: Diagnosis not present

## 2020-05-10 NOTE — Telephone Encounter (Signed)
[name] called to reschedule her surgery with Dr. Al Corpus from 06/10/20 to 07/29/2020. She stated her mother just passed away and she needs to take care of her estate before having surgery. Notified Dr. Al Corpus and Aram Beecham with [facility]

## 2020-05-10 NOTE — Telephone Encounter (Signed)
Please leave me a sticky note in her chart about her mother so I can remember.  Thank you

## 2020-05-12 ENCOUNTER — Encounter: Payer: Federal, State, Local not specified - PPO | Admitting: Podiatry

## 2020-05-12 DIAGNOSIS — Z03818 Encounter for observation for suspected exposure to other biological agents ruled out: Secondary | ICD-10-CM | POA: Diagnosis not present

## 2020-05-26 ENCOUNTER — Encounter: Payer: Federal, State, Local not specified - PPO | Admitting: Podiatry

## 2020-06-08 DIAGNOSIS — F332 Major depressive disorder, recurrent severe without psychotic features: Secondary | ICD-10-CM | POA: Diagnosis not present

## 2020-06-08 DIAGNOSIS — F411 Generalized anxiety disorder: Secondary | ICD-10-CM | POA: Diagnosis not present

## 2020-06-09 ENCOUNTER — Encounter: Payer: Federal, State, Local not specified - PPO | Admitting: Podiatry

## 2020-06-09 DIAGNOSIS — Z03818 Encounter for observation for suspected exposure to other biological agents ruled out: Secondary | ICD-10-CM | POA: Diagnosis not present

## 2020-06-16 ENCOUNTER — Encounter: Payer: Federal, State, Local not specified - PPO | Admitting: Podiatry

## 2020-06-23 ENCOUNTER — Encounter: Payer: Federal, State, Local not specified - PPO | Admitting: Podiatry

## 2020-06-30 DIAGNOSIS — M79676 Pain in unspecified toe(s): Secondary | ICD-10-CM

## 2020-07-07 ENCOUNTER — Encounter: Payer: Federal, State, Local not specified - PPO | Admitting: Podiatry

## 2020-07-09 ENCOUNTER — Emergency Department (HOSPITAL_COMMUNITY): Payer: Federal, State, Local not specified - PPO

## 2020-07-09 ENCOUNTER — Emergency Department (HOSPITAL_COMMUNITY)
Admission: EM | Admit: 2020-07-09 | Discharge: 2020-07-09 | Disposition: A | Discharge status: Home / Self Care | Payer: Federal, State, Local not specified - PPO | Attending: Emergency Medicine | Admitting: Emergency Medicine

## 2020-07-09 ENCOUNTER — Other Ambulatory Visit: Payer: Self-pay

## 2020-07-09 DIAGNOSIS — S6392XA Sprain of unspecified part of left wrist and hand, initial encounter: Secondary | ICD-10-CM | POA: Diagnosis not present

## 2020-07-09 DIAGNOSIS — S63615A Unspecified sprain of left ring finger, initial encounter: Secondary | ICD-10-CM | POA: Diagnosis not present

## 2020-07-09 DIAGNOSIS — J45998 Other asthma: Secondary | ICD-10-CM | POA: Insufficient documentation

## 2020-07-09 DIAGNOSIS — Z79899 Other long term (current) drug therapy: Secondary | ICD-10-CM | POA: Diagnosis not present

## 2020-07-09 DIAGNOSIS — S6992XA Unspecified injury of left wrist, hand and finger(s), initial encounter: Secondary | ICD-10-CM | POA: Diagnosis not present

## 2020-07-09 DIAGNOSIS — W19XXXA Unspecified fall, initial encounter: Secondary | ICD-10-CM | POA: Diagnosis not present

## 2020-07-09 NOTE — Discharge Instructions (Signed)
Please read and follow all provided instructions.  You have been seen today for hand sprain and finger sprain, please follow-up with your hand doctor and/or your primary care doctor in the next couple of days.  Tests performed today include: An x-ray of the affected area - does NOT show any broken bones or dislocations.  Vital signs. See below for your results today.   Home care instructions: -- *PRICE in the first 24-48 hours after injury: Protect (with brace, splint, sling), if given by your provider Rest Ice- Do not apply ice pack directly to your skin, place towel or similar between your skin and ice/ice pack. Apply ice for 20 min, then remove for 40 min while awake Compression- Wear brace, elastic bandage, splint as directed by your provider Elevate affected extremity above the level of your heart when not walking around for the first 24-48 hours   Use Ibuprofen (Motrin/Advil) 600mg  every 6 hours as needed for pain (do not exceed max dose in 24 hours, 2400mg )  Follow-up instructions: Please follow-up with your primary care provider or the provided orthopedic physician (bone specialist) if you continue to have significant pain in 1 week. In this case you may have a more severe injury that requires further care.   Return instructions:  Please return if your fingers turn blue, you have numbness and tingling or any new or worsening pain. Please return to the Emergency Department if you experience worsening symptoms.  Please return if you have any other emergent concerns. Additional Information:  Your vital signs today were: BP 125/85 (BP Location: Right Arm)   Pulse 72   Temp 98.8 F (37.1 C) (Oral)   Resp 18   Ht 5\' 8"  (1.727 m)   Wt 60.8 kg   SpO2 100%   BMI 20.37 kg/m  If your blood pressure (BP) was elevated above 135/85 this visit, please have this repeated by your doctor within one month. ---------------

## 2020-07-09 NOTE — ED Provider Notes (Signed)
[facility]-EMERGENCY DEPT Provider Note   CSN: [id number] Arrival date & time: 07/09/20  1029     History Chief Complaint  Patient presents with  . left hand injury    [name] is a 54 y.o. female with no pertinent past medical history that presents the emergency department today for mechanical fall.  Patient states that she fell on her L hand.  Is currently complaining of pain on the dorsal aspect of her left hand including her third and fourth digits of the left hand.  States that her digits bent slightly backward.  Has not take anything for this.  Is not on any blood thinners.  Did not hit her head, no LOC.  Denies any numbness and tingling down her fingers.  Denies any swelling.  Denies any prior trauma to this fingers.  Patient is in complaining of pain anywhere else.  HPI     Past Medical History:  Diagnosis Date  . Allergy-induced asthma    no current med.  . Complication of anesthesia    states is hard to get to sleep and hard to wake up  . History of CVA (cerebrovascular accident) 2004   states no deficits  . Innocent heart murmur    per pt.  . Medial meniscus tear 08/2016   left knee  . Seasonal allergies     Patient Active Problem List   Diagnosis Date Noted  . Right wrist pain 04/08/2018  . Encntr for general adult medical exam w/o abnormal findings 05/21/2017  . Menopausal syndrome 05/21/2017  . Palpitations 05/21/2017  . Weight increased 05/21/2017  . Allergy to pollen 10/10/2016  . Migraine headache 10/10/2016  . Nasal congestion 10/10/2016  . Referred otalgia of both ears 10/10/2016  . Panic attack 11/29/2015  . Anxiety state 11/29/2015  . Chest pain 11/28/2015    Past Surgical History:  Procedure Laterality Date  . ABDOMINAL HYSTERECTOMY    . CARDIAC CATHETERIZATION N/A 11/28/2015   Procedure: Left Heart Cath and Coronary Angiography;  Surgeon: Yates Decamp, MD;  Location: [facility] INVASIVE CV LAB;  Service:  Cardiovascular;  Laterality: N/A;  . CYSTOSCOPY  09/29/2007  . KNEE ARTHROSCOPY Right 05/2016  . KNEE ARTHROSCOPY Left 1994  . KNEE ARTHROSCOPY Left 09/05/2016   Procedure: ARTHROSCOPY KNEE WITH PLICAECTOMY;  Surgeon: Gean Birchwood, MD;  Location: [facility];  Service: Orthopedics;  Laterality: Left;  . LAPAROSCOPIC HYSTERECTOMY  09/29/2007   partial  . TUBAL LIGATION     x 2     OB History   No obstetric history on file.     Family History  Problem Relation Age of Onset  . Hypertension Mother   . Diabetes Mother   . Hyperlipidemia Mother   . Pancreatic cancer Father   . Diabetes Sister   . Hypertension Sister   . Hyperlipidemia Sister     Social History   Tobacco Use  . Smoking status: Never Smoker  . Smokeless tobacco: Never Used  Vaping Use  . Vaping Use: Never used  Substance Use Topics  . Alcohol use: Yes    Alcohol/week: 0.0 standard drinks    Comment: occasionally  . Drug use: No    Home Medications Prior to Admission medications   Medication Sig Start Date End Date Taking? Authorizing Provider  Calcium-Vitamin D-Vitamin K [phone] MG-UNT-MCG TABS Take 1 tablet by mouth daily.    [provider]  cetirizine (ZYRTEC) 10 MG tablet Take 10 mg by mouth daily.  [provider]  fluticasone (FLONASE) 50 MCG/ACT nasal spray Place 1 spray into both nostrils in the morning and at bedtime.    [provider]  Multiple Vitamins-Minerals (MULTIVITAMIN WOMEN 50+ PO) Take 1 tablet by mouth daily.    [provider]  Omega-3 Fatty Acids (FISH OIL) 1000 MG CAPS Take by mouth.    [provider]  PREVIDENT 5000 ENAMEL PROTECT 1.1-5 % PSTE Take 1 application by mouth at bedtime. 03/31/18   [provider]  rosuvastatin (CRESTOR) 10 MG tablet TAKE 1 TABLET BY MOUTH EVERYDAY AT BEDTIME 04/22/20   [name], MD  Benay Spice 5 % SOLN Apply 1 drop to eye 2 (two) times daily.  04/30/17   [provider]     Allergies    Tomato  Review of Systems   Review of Systems  Constitutional: Negative for diaphoresis, fatigue and fever.  Eyes: Negative for visual disturbance.  Respiratory: Negative for shortness of breath.   Cardiovascular: Negative for chest pain.  Gastrointestinal: Negative for nausea and vomiting.  Musculoskeletal: Positive for arthralgias. Negative for back pain and myalgias.  Skin: Negative for color change, pallor, rash and wound.  Neurological: Negative for syncope, weakness, light-headedness, numbness and headaches.  Psychiatric/Behavioral: Negative for behavioral problems and confusion.    Physical Exam Updated Vital Signs BP 125/85 (BP Location: Right Arm)   Pulse 72   Temp 98.8 F (37.1 C) (Oral)   Resp 18   Ht 5\' 8"  (1.727 m)   Wt 60.8 kg   SpO2 100%   BMI 20.37 kg/m   Physical Exam Constitutional:      General: She is not in acute distress.    Appearance: Normal appearance. She is not ill-appearing, toxic-appearing or diaphoretic.  Cardiovascular:     Rate and Rhythm: Normal rate and regular rhythm.     Pulses: Normal pulses.  Pulmonary:     Effort: Pulmonary effort is normal.     Breath sounds: Normal breath sounds.  Musculoskeletal:        General: Normal range of motion.       Hands:     Comments: Patient with tenderness to dorsal aspect of left hand, no ecchymosis or swelling noted.  No snuffbox tenderness noted.  Patient with tenderness to third and fourth digits, no ecchymosis.  Cap refill less than 2 seconds.  Radial pulse 2+.  Patient is able to range hand including wrist and all digits with normal strength to all digits including abduction abduction flexion extension of all fingers and normal strength to DIP, PIP, MCP joints.  No wrist tenderness.  R Hand normal.  Skin:    General: Skin is warm and dry.     Capillary Refill: Capillary refill takes less than 2 seconds.  Neurological:     General: No focal deficit present.     Mental  Status: She is alert and oriented to person, place, and time.  Psychiatric:        Mood and Affect: Mood normal.        Behavior: Behavior normal.        Thought Content: Thought content normal.     ED Results / Procedures / Treatments   Labs (all labs ordered are listed, but only abnormal results are displayed) Labs Reviewed - No data to display  EKG None  Radiology DG Hand Complete Left  Result Date: 07/09/2020 CLINICAL DATA:  Fall.  Hand injury with pain. EXAM: LEFT HAND - COMPLETE 3+ VIEW COMPARISON:  None. FINDINGS: No findings to suggest acute fracture or dislocation. Deformity at the base of the thumb distal phalanx likely related to remote trauma. No worrisome lytic or sclerotic osseous abnormality IMPRESSION: 1. No acute findings. 2. Deformity at the base of the thumb distal phalanx likely related to remote trauma. Electronically Signed   By: Kennith Center M.D.   On: 07/09/2020 11:17    Procedures Procedures (including critical care time)  Medications Ordered in ED Medications - No data to display  ED Course  I have reviewed the triage vital signs and the nursing notes.  Pertinent labs & imaging results that were available during my care of the patient were reviewed by me and considered in my medical decision making (see chart for details).    MDM Rules/Calculators/A&P                         [name] is a 54 y.o. female with no pertinent past medical history that presents the emergency department today for mechanical fall.  Did not hit her head, no LOC, no blood thinners.  Hand exam with tenderness to mainly third and fourth digits, normal range of motion to the digits, tenderness also to dorsal aspect of hand.  No snuffbox tenderness, no wrist tenderness.  Plain films negative for fracture, patient is distally neurovascularly intact.  Will apply fingers splint and have patient follow-up with hand.  Upon discussion, patient states that she does not want  referral to hand, states that she has her own hand doctor that she will see.  Did discuss that if this does not work out she can also follow-up with your primary care who can refer her to hand.  Patient is agreeable for this.  Patient does not want any pain management here today, states that she will take Tylenol when she gets home.  Symptomatic treatment discussed including RICE therapy.  Upon reevaluation after finger splint, patient is still still neurovascularly intact, patient states that she feels slightly better with these on board.  Patient to be discharged.Doubt need for further emergent work up at this time. I explained the diagnosis and have given explicit precautions to return to the ER including for any other new or worsening symptoms. The patient understands and accepts the medical plan as it's been dictated and I have answered their questions. Discharge instructions concerning home care and prescriptions have been given. The patient is STABLE and is discharged to home in good condition.   Final Clinical Impression(s) / ED Diagnoses Final diagnoses:  Sprain of left ring finger, unspecified site of digit, initial encounter  Sprain of left hand, initial encounter    Rx / DC Orders ED Discharge Orders    None       Farrel Gordon, PA-C 07/09/20 1253    Mancel Bale, MD 07/10/20 1459

## 2020-07-09 NOTE — ED Notes (Signed)
Ortho tech to bedside. 

## 2020-07-09 NOTE — ED Triage Notes (Signed)
Patient states she was walking trail, tripped over tree root, fell and left hand was injured. Describes intense pain 10/10 left hand. No other injuries, no loc.

## 2020-07-13 DIAGNOSIS — F411 Generalized anxiety disorder: Secondary | ICD-10-CM | POA: Diagnosis not present

## 2020-07-13 DIAGNOSIS — F332 Major depressive disorder, recurrent severe without psychotic features: Secondary | ICD-10-CM | POA: Diagnosis not present

## 2020-07-14 DIAGNOSIS — S63615A Unspecified sprain of left ring finger, initial encounter: Secondary | ICD-10-CM | POA: Diagnosis not present

## 2020-07-18 ENCOUNTER — Telehealth: Payer: Self-pay

## 2020-07-18 ENCOUNTER — Other Ambulatory Visit: Payer: Federal, State, Local not specified - PPO

## 2020-07-18 ENCOUNTER — Other Ambulatory Visit: Payer: Self-pay

## 2020-07-18 DIAGNOSIS — E7800 Pure hypercholesterolemia, unspecified: Secondary | ICD-10-CM | POA: Diagnosis not present

## 2020-07-18 DIAGNOSIS — Z0000 Encounter for general adult medical examination without abnormal findings: Secondary | ICD-10-CM

## 2020-07-18 NOTE — Telephone Encounter (Signed)
DOS 07/29/2020  METATARSAL OSTEOTOMY 5TH RT- 28308 HAMMERTOE REPAIR 5TH RT - 28285  [employer] FED EFFECTIVE DATE - 06/07/2018  PLAN DEDUCTIBLE - $350.00 W/ $0.00 REMAINING OUT OF POCKET - $5000.00 W/ $1725.00 REMAINING COPAY $0.00 COINSURANCE - 15%  NO AUTH REQUIRED PER WEBSITE

## 2020-07-19 ENCOUNTER — Other Ambulatory Visit: Payer: Federal, State, Local not specified - PPO

## 2020-07-19 LAB — CMP14+EGFR
ALT: 23 IU/L (ref 0–32)
AST: 20 IU/L (ref 0–40)
Albumin/Globulin Ratio: 1.3 (ref 1.2–2.2)
Albumin: 4.3 g/dL (ref 3.8–4.9)
Alkaline Phosphatase: 76 IU/L (ref 44–121)
BUN/Creatinine Ratio: 20 (ref 9–23)
BUN: 16 mg/dL (ref 6–24)
Bilirubin Total: 0.3 mg/dL (ref 0.0–1.2)
CO2: 25 mmol/L (ref 20–29)
Calcium: 9.9 mg/dL (ref 8.7–10.2)
Chloride: 102 mmol/L (ref 96–106)
Creatinine, Ser: 0.81 mg/dL (ref 0.57–1.00)
GFR calc Af Amer: 95 mL/min/{1.73_m2} (ref >59)
GFR calc non Af Amer: 82 mL/min/{1.73_m2} (ref >59)
Globulin, Total: 3.4 g/dL (ref 1.5–4.5)
Glucose: 82 mg/dL (ref 65–99)
Potassium: 4.9 mmol/L (ref 3.5–5.2)
Sodium: 138 mmol/L (ref 134–144)
Total Protein: 7.7 g/dL (ref 6.0–8.5)

## 2020-07-19 LAB — CBC WITH DIFFERENTIAL/PLATELET
Basophils Absolute: 0 10*3/uL (ref 0.0–0.2)
Basos: 1 %
EOS (ABSOLUTE): 0.1 10*3/uL (ref 0.0–0.4)
Eos: 2 %
Hematocrit: 37.4 % (ref 34.0–46.6)
Hemoglobin: 11.8 g/dL (ref 11.1–15.9)
Immature Grans (Abs): 0 10*3/uL (ref 0.0–0.1)
Immature Granulocytes: 0 %
Lymphocytes Absolute: 1.9 10*3/uL (ref 0.7–3.1)
Lymphs: 36 %
MCH: 28.3 pg (ref 26.6–33.0)
MCHC: 31.6 g/dL (ref 31.5–35.7)
MCV: 90 fL (ref 79–97)
Monocytes Absolute: 0.3 10*3/uL (ref 0.1–0.9)
Monocytes: 6 %
Neutrophils Absolute: 3 10*3/uL (ref 1.4–7.0)
Neutrophils: 55 %
Platelets: 348 10*3/uL (ref 150–450)
RBC: 4.17 x10E6/uL (ref 3.77–5.28)
RDW: 14.2 % (ref 11.7–15.4)
WBC: 5.4 10*3/uL (ref 3.4–10.8)

## 2020-07-19 LAB — LIPID PANEL
Chol/HDL Ratio: 2.3 ratio (ref 0.0–4.4)
Cholesterol, Total: 162 mg/dL (ref 100–199)
HDL: 72 mg/dL (ref >39)
LDL Chol Calc (NIH): 79 mg/dL (ref 0–99)
Triglycerides: 52 mg/dL (ref 0–149)
VLDL Cholesterol Cal: 11 mg/dL (ref 5–40)

## 2020-07-19 LAB — HEMOGLOBIN A1C
Est. average glucose Bld gHb Est-mCnc: 126 mg/dL
Hgb A1c MFr Bld: 6 % — ABNORMAL HIGH (ref 4.8–5.6)

## 2020-07-26 ENCOUNTER — Encounter: Payer: Federal, State, Local not specified - PPO | Admitting: Podiatry

## 2020-07-27 ENCOUNTER — Encounter: Payer: Self-pay | Admitting: Internal Medicine

## 2020-07-27 ENCOUNTER — Other Ambulatory Visit: Payer: Self-pay

## 2020-07-27 ENCOUNTER — Ambulatory Visit: Payer: Federal, State, Local not specified - PPO | Admitting: Internal Medicine

## 2020-07-27 VITALS — BP 122/66 | HR 75 | Temp 98.0°F | Ht 67.6 in | Wt 152.6 lb

## 2020-07-27 DIAGNOSIS — Z0000 Encounter for general adult medical examination without abnormal findings: Secondary | ICD-10-CM | POA: Diagnosis not present

## 2020-07-27 DIAGNOSIS — Z23 Encounter for immunization: Secondary | ICD-10-CM | POA: Diagnosis not present

## 2020-07-27 MED ORDER — SHINGRIX 50 MCG/0.5ML IM SUSR: 0.5000 mL | Freq: Once | INTRAMUSCULAR | 0 refills | Status: AC

## 2020-07-27 NOTE — Progress Notes (Signed)
I,[name],acting as a Neurosurgeon for Kathryn Aliment, MD.,have documented all relevant documentation on the behalf of Kathryn Aliment, MD,as directed by  Kathryn Aliment, MD while in the presence of Kathryn Aliment, MD.  This visit occurred during the SARS-CoV-2 public health emergency.  Safety protocols were in place, including screening questions prior to the visit, additional usage of staff PPE, and extensive cleaning of exam room while observing appropriate contact time as indicated for disinfecting solutions.  Subjective:     Patient ID: [name] , female    DOB: 10/20/1965 , 54 y.o.   MRN: [id number]   Chief Complaint  Patient presents with  . Annual Exam    HPI  She is here today for a full physical examination. She is followed by Dr. Normand Sloop for her GYN exams. She is s/p hysterectomy.      Past Medical History:  Diagnosis Date  . Allergy-induced asthma    no current med.  . Complication of anesthesia    states is hard to get to sleep and hard to wake up  . History of CVA (cerebrovascular accident) 2004   states no deficits  . Innocent heart murmur    per pt.  . Medial meniscus tear 08/2016   left knee  . Seasonal allergies      Family History  Problem Relation Age of Onset  . Hypertension Mother   . Diabetes Mother   . Hyperlipidemia Mother   . Pancreatic cancer Father   . Diabetes Sister   . Hypertension Sister   . Hyperlipidemia Sister      Current Outpatient Medications:  .  Calcium-Vitamin D-Vitamin K [phone] MG-UNT-MCG TABS, Take 1 tablet by mouth daily., Disp: , Rfl:  .  cetirizine (ZYRTEC) 10 MG tablet, Take 10 mg by mouth daily., Disp: , Rfl:  .  fluticasone (FLONASE) 50 MCG/ACT nasal spray, Place 1 spray into both nostrils in the morning and at bedtime., Disp: , Rfl:  .  Multiple Vitamins-Minerals (MULTIVITAMIN WOMEN 50+ PO), Take 1 tablet by mouth daily., Disp: , Rfl:  .  NONFORMULARY OR COMPOUNDED ITEM, Previden rinse 0.2%  sodium fluoride, Disp: , Rfl:  .  Omega-3 Fatty Acids (FISH OIL) 1000 MG CAPS, Take by mouth., Disp: , Rfl:  .  PREVIDENT 5000 ENAMEL PROTECT 1.1-5 % PSTE, Take 1 application by mouth at bedtime., Disp: , Rfl:  .  rosuvastatin (CRESTOR) 10 MG tablet, TAKE 1 TABLET BY MOUTH EVERYDAY AT BEDTIME, Disp: 90 tablet, Rfl: 2 .  XIIDRA 5 % SOLN, Apply 1 drop to eye 2 (two) times daily. , Disp: , Rfl: 3 .  cephALEXin (KEFLEX) 500 MG capsule, Take 1 capsule (500 mg total) by mouth 3 (three) times daily., Disp: 30 capsule, Rfl: 0 .  ondansetron (ZOFRAN) 4 MG tablet, Take 1 tablet (4 mg total) by mouth every 8 (eight) hours as needed., Disp: 20 tablet, Rfl: 0 .  oxyCODONE-acetaminophen (PERCOCET) 10-325 MG tablet, Take 1 tablet by mouth every 8 (eight) hours as needed for up to 7 days for pain., Disp: 21 tablet, Rfl: 0   Allergies  Allergen Reactions  . Tomato Anaphylaxis      The patient states she uses status post hysterectomy for birth control. Last LMP was No LMP recorded. Patient has had a hysterectomy.. Negative for Dysmenorrhea. Negative for: breast discharge, breast lump(s), breast pain and breast self exam. Associated symptoms include abnormal vaginal bleeding. Pertinent negatives include abnormal bleeding (hematology), anxiety, decreased libido, depression,  difficulty falling sleep, dyspareunia, history of infertility, nocturia, sexual dysfunction, sleep disturbances, urinary incontinence, urinary urgency, vaginal discharge and vaginal itching. Diet regular.The patient states her exercise level is  moderate.  . The patient's tobacco use is:  Social History   Tobacco Use  Smoking Status Never Smoker  Smokeless Tobacco Never Used  . She has been exposed to passive smoke. The patient's alcohol use is:  Social History   Substance and Sexual Activity  Alcohol Use Yes  . Alcohol/week: 0.0 standard drinks   Comment: occasionally    Review of Systems  Constitutional: Negative.   HENT: Negative.    Eyes: Negative.   Respiratory: Negative.   Cardiovascular: Negative.   Endocrine: Negative.   Genitourinary: Negative.   Musculoskeletal: Negative.   Skin: Negative.   Allergic/Immunologic: Negative.   Neurological: Negative.   Hematological: Negative.   Psychiatric/Behavioral: Negative.      Today's Vitals   07/27/20 0954  BP: 122/66  Pulse: 75  Temp: 98 F (36.7 C)  TempSrc: Oral  Weight: 152 lb 9.6 oz (69.2 kg)  Height: 5' 7.6" (1.717 m)   Body mass index is 23.48 kg/m.  Wt Readings from Last 3 Encounters:  07/27/20 152 lb 9.6 oz (69.2 kg)  07/09/20 134 lb (60.8 kg)  02/04/20 140 lb (63.5 kg)   Objective:  Physical Exam Constitutional:      General: She is not in acute distress.    Appearance: Normal appearance. She is well-developed.  HENT:     Head: Normocephalic and atraumatic.     Right Ear: Hearing, tympanic membrane, ear canal and external ear normal. There is no impacted cerumen.     Left Ear: Hearing, tympanic membrane, ear canal and external ear normal. There is no impacted cerumen.     Nose:     Comments: Deferred, masked    Mouth/Throat:     Comments: Deferred, masked Eyes:     General: Lids are normal.     Extraocular Movements: Extraocular movements intact.     Conjunctiva/sclera: Conjunctivae normal.     Pupils: Pupils are equal, round, and reactive to light.     Funduscopic exam:    Right eye: No papilledema.        Left eye: No papilledema.  Neck:     Thyroid: No thyroid mass.     Vascular: No carotid bruit.  Cardiovascular:     Rate and Rhythm: Normal rate and regular rhythm.     Pulses: Normal pulses.     Heart sounds: Normal heart sounds. No murmur heard.   Pulmonary:     Effort: Pulmonary effort is normal.     Breath sounds: Normal breath sounds.  Chest:     Breasts: Tanner Score is 5.        Right: Normal.        Left: Normal.  Abdominal:     General: Abdomen is flat. Bowel sounds are normal. There is no distension.      Palpations: Abdomen is soft.     Tenderness: There is no abdominal tenderness.  Genitourinary:    Comments: deferred Musculoskeletal:        General: No swelling. Normal range of motion.     Cervical back: Full passive range of motion without pain, normal range of motion and neck supple.     Right lower leg: No edema.     Left lower leg: No edema.  Skin:    General: Skin is warm and dry.  Capillary Refill: Capillary refill takes less than 2 seconds.  Neurological:     General: No focal deficit present.     Mental Status: She is alert and oriented to person, place, and time.     Cranial Nerves: No cranial nerve deficit.     Sensory: No sensory deficit.  Psychiatric:        Mood and Affect: Mood normal.        Behavior: Behavior normal.        Thought Content: Thought content normal.        Judgment: Judgment normal.         Assessment And Plan:     1. Routine general medical examination at health care facility Comments: A full exam was performed.  Importance of monthly self breast exams was discussed with the patient.  Weight reviewed, we both agree 11/13 ER reading of 134 is incorrect. She had her labs drawn prior to today, we went over her results in full detail today. All questions were answered to her satisfaction. PATIENT IS ADVISED TO GET 30-45 MINUTES REGULAR EXERCISE NO LESS THAN FOUR TO FIVE DAYS PER WEEK - BOTH WEIGHTBEARING EXERCISES AND AEROBIC ARE RECOMMENDED.  PATIENT IS ADVISED TO FOLLOW A HEALTHY DIET WITH AT LEAST SIX FRUITS/VEGGIES PER DAY, DECREASE INTAKE OF RED MEAT, AND TO INCREASE FISH INTAKE TO TWO DAYS PER WEEK.  MEATS/FISH SHOULD NOT BE FRIED, BAKED OR BROILED IS PREFERABLE.  I SUGGEST WEARING SPF 50 SUNSCREEN ON EXPOSED PARTS AND ESPECIALLY WHEN IN THE DIRECT SUNLIGHT FOR AN EXTENDED PERIOD OF TIME.  PLEASE AVOID FAST FOOD RESTAURANTS AND INCREASE YOUR WATER INTAKE.  - Hepatitis C antibody  2. Immunization due Comments: I will send rx Shingrix to her  local pharmacy.      Patient was given opportunity to ask questions. Patient verbalized understanding of the plan and was able to repeat key elements of the plan. All questions were answered to their satisfaction.   Kathryn Aliment, MD   I, Kathryn Aliment, MD, have reviewed all documentation for this visit. The documentation on 07/31/20 for the exam, diagnosis, procedures, and orders are all accurate and complete.  THE PATIENT IS ENCOURAGED TO PRACTICE SOCIAL DISTANCING DUE TO THE COVID-19 PANDEMIC.

## 2020-07-27 NOTE — Patient Instructions (Signed)
Health Maintenance, Female Adopting a healthy lifestyle and getting preventive care are important in promoting health and wellness. Ask your health care provider about:  The right schedule for you to have regular tests and exams.  Things you can do on your own to prevent diseases and keep yourself healthy. What should I know about diet, weight, and exercise? Eat a healthy diet   Eat a diet that includes plenty of vegetables, fruits, low-fat dairy products, and lean protein.  Do not eat a lot of foods that are high in solid fats, added sugars, or sodium. Maintain a healthy weight Body mass index (BMI) is used to identify weight problems. It estimates body fat based on height and weight. Your health care provider can help determine your BMI and help you achieve or maintain a healthy weight. Get regular exercise Get regular exercise. This is one of the most important things you can do for your health. Most adults should:  Exercise for at least 150 minutes each week. The exercise should increase your heart rate and make you sweat (moderate-intensity exercise).  Do strengthening exercises at least twice a week. This is in addition to the moderate-intensity exercise.  Spend less time sitting. Even light physical activity can be beneficial. Watch cholesterol and blood lipids Have your blood tested for lipids and cholesterol at 55 years of age, then have this test every 5 years. Have your cholesterol levels checked more often if:  Your lipid or cholesterol levels are high.  You are older than 55 years of age.  You are at high risk for heart disease. What should I know about cancer screening? Depending on your health history and family history, you may need to have cancer screening at various ages. This may include screening for:  Breast cancer.  Cervical cancer.  Colorectal cancer.  Skin cancer.  Lung cancer. What should I know about heart disease, diabetes, and high blood  pressure? Blood pressure and heart disease  High blood pressure causes heart disease and increases the risk of stroke. This is more likely to develop in people who have high blood pressure readings, are of African descent, or are overweight.  Have your blood pressure checked: ? Every 3-5 years if you are 18-39 years of age. ? Every year if you are 40 years old or older. Diabetes Have regular diabetes screenings. This checks your fasting blood sugar level. Have the screening done:  Once every three years after age 40 if you are at a normal weight and have a low risk for diabetes.  More often and at a younger age if you are overweight or have a high risk for diabetes. What should I know about preventing infection? Hepatitis B If you have a higher risk for hepatitis B, you should be screened for this virus. Talk with your health care provider to find out if you are at risk for hepatitis B infection. Hepatitis C Testing is recommended for:  Everyone born from 1945 through 1965.  Anyone with known risk factors for hepatitis C. Sexually transmitted infections (STIs)  Get screened for STIs, including gonorrhea and chlamydia, if: ? You are sexually active and are younger than 55 years of age. ? You are older than 55 years of age and your health care provider tells you that you are at risk for this type of infection. ? Your sexual activity has changed since you were last screened, and you are at increased risk for chlamydia or gonorrhea. Ask your health care provider if   you are at risk.  Ask your health care provider about whether you are at high risk for HIV. Your health care provider may recommend a prescription medicine to help prevent HIV infection. If you choose to take medicine to prevent HIV, you should first get tested for HIV. You should then be tested every 3 months for as long as you are taking the medicine. Pregnancy  If you are about to stop having your period (premenopausal) and  you may become pregnant, seek counseling before you get pregnant.  Take 400 to 800 micrograms (mcg) of folic acid every day if you become pregnant.  Ask for birth control (contraception) if you want to prevent pregnancy. Osteoporosis and menopause Osteoporosis is a disease in which the bones lose minerals and strength with aging. This can result in bone fractures. If you are 65 years old or older, or if you are at risk for osteoporosis and fractures, ask your health care provider if you should:  Be screened for bone loss.  Take a calcium or vitamin D supplement to lower your risk of fractures.  Be given hormone replacement therapy (HRT) to treat symptoms of menopause. Follow these instructions at home: Lifestyle  Do not use any products that contain nicotine or tobacco, such as cigarettes, e-cigarettes, and chewing tobacco. If you need help quitting, ask your health care provider.  Do not use street drugs.  Do not share needles.  Ask your health care provider for help if you need support or information about quitting drugs. Alcohol use  Do not drink alcohol if: ? Your health care provider tells you not to drink. ? You are pregnant, may be pregnant, or are planning to become pregnant.  If you drink alcohol: ? Limit how much you use to 0-1 drink a day. ? Limit intake if you are breastfeeding.  Be aware of how much alcohol is in your drink. In the [place] one drink equals one 12 oz bottle of beer (355 mL), one 5 oz glass of wine (148 mL), or one 1 oz glass of hard liquor (44 mL). General instructions  Schedule regular health, dental, and eye exams.  Stay current with your vaccines.  Tell your health care provider if: ? You often feel depressed. ? You have ever been abused or do not feel safe at home. Summary  Adopting a healthy lifestyle and getting preventive care are important in promoting health and wellness.  Follow your health care provider's instructions about healthy  diet, exercising, and getting tested or screened for diseases.  Follow your health care provider's instructions on monitoring your cholesterol and blood pressure. This information is not intended to replace advice given to you by your health care provider. Make sure you discuss any questions you have with your health care provider. Document Revised: 08/06/2018 Document Reviewed: 08/06/2018 Elsevier Patient Education  2020 [employer]  

## 2020-07-28 ENCOUNTER — Other Ambulatory Visit: Payer: Self-pay | Admitting: Podiatry

## 2020-07-28 LAB — HEPATITIS C ANTIBODY: Hep C Virus Ab: <0.1 s/co ratio (ref 0.0–0.9)

## 2020-07-28 MED ORDER — OXYCODONE-ACETAMINOPHEN 10-325 MG PO TABS
1.0000 | ORAL_TABLET | Freq: Three times a day (TID) | ORAL | 0 refills | Status: AC | PRN
Start: 2020-07-28 — End: 2020-08-04

## 2020-07-28 MED ORDER — CEPHALEXIN 500 MG PO CAPS: 500.0000 mg | ORAL_CAPSULE | Freq: Three times a day (TID) | ORAL | 0 refills | Status: DC

## 2020-07-28 MED ORDER — ONDANSETRON HCL 4 MG PO TABS: 4.0000 mg | ORAL_TABLET | Freq: Three times a day (TID) | ORAL | 0 refills | Status: DC | PRN

## 2020-07-29 DIAGNOSIS — M2041 Other hammer toe(s) (acquired), right foot: Secondary | ICD-10-CM | POA: Diagnosis not present

## 2020-07-29 DIAGNOSIS — M21621 Bunionette of right foot: Secondary | ICD-10-CM | POA: Diagnosis not present

## 2020-07-29 DIAGNOSIS — M21541 Acquired clubfoot, right foot: Secondary | ICD-10-CM | POA: Diagnosis not present

## 2020-07-29 DIAGNOSIS — M216X1 Other acquired deformities of right foot: Secondary | ICD-10-CM | POA: Diagnosis not present

## 2020-07-29 DIAGNOSIS — M25571 Pain in right ankle and joints of right foot: Secondary | ICD-10-CM | POA: Diagnosis not present

## 2020-08-03 DIAGNOSIS — F332 Major depressive disorder, recurrent severe without psychotic features: Secondary | ICD-10-CM | POA: Diagnosis not present

## 2020-08-03 DIAGNOSIS — F411 Generalized anxiety disorder: Secondary | ICD-10-CM | POA: Diagnosis not present

## 2020-08-04 ENCOUNTER — Encounter: Payer: Self-pay | Admitting: Podiatry

## 2020-08-04 ENCOUNTER — Other Ambulatory Visit: Payer: Self-pay

## 2020-08-04 ENCOUNTER — Ambulatory Visit (INDEPENDENT_AMBULATORY_CARE_PROVIDER_SITE_OTHER): Payer: Federal, State, Local not specified - PPO

## 2020-08-04 ENCOUNTER — Ambulatory Visit (INDEPENDENT_AMBULATORY_CARE_PROVIDER_SITE_OTHER): Payer: Federal, State, Local not specified - PPO | Admitting: Podiatry

## 2020-08-04 DIAGNOSIS — M21621 Bunionette of right foot: Secondary | ICD-10-CM

## 2020-08-04 DIAGNOSIS — M2041 Other hammer toe(s) (acquired), right foot: Secondary | ICD-10-CM

## 2020-08-04 DIAGNOSIS — Z9889 Other specified postprocedural states: Secondary | ICD-10-CM

## 2020-08-04 NOTE — Progress Notes (Signed)
She presents today for her first postop visit date of surgery was 07/29/2020 metatarsal osteotomy fifth right with hammertoe repair fifth right.  She says is really feeling pretty good now she said it was really throbbing after the local wore off.  Objective: Vital signs are stable alert oriented x3 there is no erythema there is mild edema no cellulitis drainage or odor sutures are intact margins remain well coapted.  Radiographs taken today demonstrate capital osteotomy fifth metatarsal of double screw fixation intact.  Assessment: Well-healing surgical foot.  Plan: Follow-up with me in 1 week redressed the foot today dressed compressive dressing continue minimal ambulation.

## 2020-08-11 ENCOUNTER — Encounter: Payer: Self-pay | Admitting: Podiatry

## 2020-08-11 ENCOUNTER — Other Ambulatory Visit: Payer: Self-pay

## 2020-08-11 ENCOUNTER — Ambulatory Visit (INDEPENDENT_AMBULATORY_CARE_PROVIDER_SITE_OTHER): Payer: Federal, State, Local not specified - PPO | Admitting: Podiatry

## 2020-08-11 DIAGNOSIS — Z9889 Other specified postprocedural states: Secondary | ICD-10-CM

## 2020-08-11 DIAGNOSIS — M21621 Bunionette of right foot: Secondary | ICD-10-CM

## 2020-08-11 DIAGNOSIS — M2041 Other hammer toe(s) (acquired), right foot: Secondary | ICD-10-CM

## 2020-08-11 NOTE — Progress Notes (Signed)
Presents for postop visit #2 today Lizzet Hendley date of surgery is 07/29/2020 metatarsal osteotomy fifth right hammertoe repair fifth right.  She states that it throbs in the mornings when I drop it over the bed but all in all is not doing too badly.  Objective: Vital signs are stable alert and oriented x3.  Pulses are palpable.  Dressed her dressing intact was removed demonstrates minimal edema no erythema cellulitis drainage or odor sutures are intact margins well coapted so they were removed today.  I removed the sutures margins remain well coapted no signs of infection.  Assessment: Well-healing surgical foot x2 weeks right.  Plan: Based on a compression anklet with some tenderness doing that today she will also be given a Darco shoe.  I will follow-up with her in about 2 weeks to make sure she is doing well should she have questions or concerns she will notify us immediately.  Explained to her not to sleep in the anklet and also demonstrated to her how to wrap her fifth toe.

## 2020-08-15 ENCOUNTER — Encounter: Payer: Self-pay | Admitting: Podiatry

## 2020-08-16 ENCOUNTER — Encounter: Payer: Self-pay | Admitting: Internal Medicine

## 2020-08-25 ENCOUNTER — Ambulatory Visit (INDEPENDENT_AMBULATORY_CARE_PROVIDER_SITE_OTHER): Payer: Federal, State, Local not specified - PPO | Admitting: Podiatry

## 2020-08-25 ENCOUNTER — Ambulatory Visit (INDEPENDENT_AMBULATORY_CARE_PROVIDER_SITE_OTHER): Payer: Federal, State, Local not specified - PPO

## 2020-08-25 ENCOUNTER — Other Ambulatory Visit: Payer: Self-pay

## 2020-08-25 DIAGNOSIS — M2041 Other hammer toe(s) (acquired), right foot: Secondary | ICD-10-CM

## 2020-08-25 DIAGNOSIS — M21621 Bunionette of right foot: Secondary | ICD-10-CM

## 2020-08-25 DIAGNOSIS — Z9889 Other specified postprocedural states: Secondary | ICD-10-CM

## 2020-08-25 MED ORDER — GABAPENTIN 100 MG PO CAPS: 100.0000 mg | ORAL_CAPSULE | Freq: Every day | ORAL | 1 refills | Status: DC

## 2020-08-25 NOTE — Progress Notes (Signed)
She presents today for postop visit date of surgery is 07/29/2020 and she is approximately 1 month status post fifth metatarsal osteotomy and hammertoe repair fifth right.  She states that she is having circulation problems she feels she says but there is really no pain she states that she has some numbness with radiating pain up her legs every once in a while but not very often.  She continues to wear the anklet and the surgical shoe.  Objective: Signs are stable she is alert and oriented x3.  Pulses are palpable.  No edema no erythema cellulitis drainage odor incision sites: Healed uneventfully.  Still has some tenderness on palpation of the surgical site.  Radiographs taken today demonstrate 2 screws to the fifth metatarsal head intact.  Assessment: Well-healing osteotomy fifth right.  Plan: Encouraged her to wear the Darco shoes for the next 2 weeks then try to transition into regular tennis shoes I will follow-up with her in 3 weeks.

## 2020-09-06 ENCOUNTER — Other Ambulatory Visit: Payer: Self-pay

## 2020-09-06 ENCOUNTER — Ambulatory Visit (INDEPENDENT_AMBULATORY_CARE_PROVIDER_SITE_OTHER): Payer: Federal, State, Local not specified - PPO | Admitting: Podiatry

## 2020-09-06 ENCOUNTER — Ambulatory Visit (INDEPENDENT_AMBULATORY_CARE_PROVIDER_SITE_OTHER): Payer: Federal, State, Local not specified - PPO

## 2020-09-06 ENCOUNTER — Encounter: Payer: Self-pay | Admitting: Podiatry

## 2020-09-06 DIAGNOSIS — M21621 Bunionette of right foot: Secondary | ICD-10-CM | POA: Diagnosis not present

## 2020-09-06 DIAGNOSIS — M2041 Other hammer toe(s) (acquired), right foot: Secondary | ICD-10-CM

## 2020-09-06 DIAGNOSIS — Z9889 Other specified postprocedural states: Secondary | ICD-10-CM

## 2020-09-07 DIAGNOSIS — F330 Major depressive disorder, recurrent, mild: Secondary | ICD-10-CM | POA: Diagnosis not present

## 2020-09-07 NOTE — Progress Notes (Signed)
She presents today wearing a regular shoe.  Date of surgery 07/29/2020 fifth metatarsal osteotomy and hammertoe repair fifth right.  States is feeling really good I got a shoe on and he seems to be doing okay at this point.  Denies fever chills nausea vomiting muscle aches pains is very excited about her outcome.  Objective: Vital signs are stable she is alert and oriented x3 incision sites have gone on to heal uneventfully there is no erythema edema cellulitis drainage or odor.  Radiographs demonstrate the osteotomy is healing very nicely.  Toe is in a rectus position.  Assessment: Well-healing surgical foot right.  Plan: I encouraged her to continue exactly what she is doing and not to overdo it just because she is feeling better I expressed to her that this does not mean that she can be overactive on this and that she does have another 4 weeks of healing to go.  She understands this and is amenable to it I will follow-up with her in 1 month for another set of x-rays.  Should she have questions or concerns or feel that she has overdone it she is notify us immediately.

## 2020-09-08 ENCOUNTER — Encounter: Payer: Federal, State, Local not specified - PPO | Admitting: Podiatry

## 2020-09-15 DIAGNOSIS — Z01419 Encounter for gynecological examination (general) (routine) without abnormal findings: Secondary | ICD-10-CM | POA: Diagnosis not present

## 2020-09-15 DIAGNOSIS — B372 Candidiasis of skin and nail: Secondary | ICD-10-CM | POA: Diagnosis not present

## 2020-09-29 DIAGNOSIS — L669 Cicatricial alopecia, unspecified: Secondary | ICD-10-CM | POA: Insufficient documentation

## 2020-10-11 ENCOUNTER — Ambulatory Visit (INDEPENDENT_AMBULATORY_CARE_PROVIDER_SITE_OTHER): Payer: Federal, State, Local not specified - PPO | Admitting: Podiatry

## 2020-10-11 ENCOUNTER — Ambulatory Visit (INDEPENDENT_AMBULATORY_CARE_PROVIDER_SITE_OTHER): Payer: Federal, State, Local not specified - PPO

## 2020-10-11 ENCOUNTER — Other Ambulatory Visit: Payer: Self-pay

## 2020-10-11 ENCOUNTER — Encounter: Payer: Self-pay | Admitting: Podiatry

## 2020-10-11 DIAGNOSIS — M2041 Other hammer toe(s) (acquired), right foot: Secondary | ICD-10-CM

## 2020-10-11 DIAGNOSIS — M21621 Bunionette of right foot: Secondary | ICD-10-CM

## 2020-10-11 DIAGNOSIS — Z9889 Other specified postprocedural states: Secondary | ICD-10-CM

## 2020-10-11 NOTE — Progress Notes (Signed)
She presents today date of surgery 07/29/2020 status post fifth metatarsal osteotomy of derotational arthroplasty fifth toe right foot.  She denies fever chills nausea vomiting muscle aches pains states is a bit tender and gets little sharp shooting pain every once in a while but for the most part is doing very well.  States that she just purchased her first RV and she will start traveling in the near future.  Her husband passed away over the past 3 years as did her mother.  She will be traveling alone.  Objective: Vital signs are stable she is alert oriented x3 there is no erythematous minimal edema in the fourth intermetatarsal space no cellulitis drainage or odor mild reactive hyperkeratotic lesion lateral aspect of the fifth toenail right foot.  Radiographs taken today demonstrate fifth metatarsal osteotomy healing very nicely much decrease in edema right foot.  Assessment: Well-healing surgical foot.  Plan: Debrided the reactive hyperkeratotic lesion around the lateral border the nail for her today.  Otherwise I am going asked that she not overdo it with hiking and exercise for at least 1 more month I would like to follow-up with her in 1 month before being released x-rays will be taken at that time.

## 2020-10-12 DIAGNOSIS — F330 Major depressive disorder, recurrent, mild: Secondary | ICD-10-CM | POA: Diagnosis not present

## 2020-10-13 ENCOUNTER — Telehealth: Payer: Self-pay

## 2020-10-13 NOTE — Telephone Encounter (Signed)
spk with [employer] rep Randa Evens   States it was billed with correct code and that the insurance response was co ins amount for 3.55

## 2020-10-20 ENCOUNTER — Encounter: Payer: Self-pay | Admitting: Podiatry

## 2020-10-20 DIAGNOSIS — M778 Other enthesopathies, not elsewhere classified: Secondary | ICD-10-CM | POA: Diagnosis not present

## 2020-10-20 DIAGNOSIS — M25562 Pain in left knee: Secondary | ICD-10-CM | POA: Diagnosis not present

## 2020-10-20 DIAGNOSIS — M222X2 Patellofemoral disorders, left knee: Secondary | ICD-10-CM | POA: Diagnosis not present

## 2020-11-01 ENCOUNTER — Emergency Department (HOSPITAL_COMMUNITY)
Admission: EM | Admit: 2020-11-01 | Discharge: 2020-11-01 | Disposition: A | Discharge status: Home / Self Care | Payer: Federal, State, Local not specified - PPO | Attending: Emergency Medicine | Admitting: Emergency Medicine

## 2020-11-01 ENCOUNTER — Other Ambulatory Visit: Payer: Self-pay

## 2020-11-01 ENCOUNTER — Emergency Department (HOSPITAL_COMMUNITY): Payer: Federal, State, Local not specified - PPO

## 2020-11-01 ENCOUNTER — Encounter (HOSPITAL_COMMUNITY): Payer: Self-pay

## 2020-11-01 DIAGNOSIS — R0781 Pleurodynia: Secondary | ICD-10-CM | POA: Diagnosis not present

## 2020-11-01 DIAGNOSIS — R0602 Shortness of breath: Secondary | ICD-10-CM | POA: Insufficient documentation

## 2020-11-01 DIAGNOSIS — R072 Precordial pain: Secondary | ICD-10-CM | POA: Diagnosis not present

## 2020-11-01 DIAGNOSIS — R109 Unspecified abdominal pain: Secondary | ICD-10-CM | POA: Diagnosis not present

## 2020-11-01 DIAGNOSIS — J45909 Unspecified asthma, uncomplicated: Secondary | ICD-10-CM | POA: Insufficient documentation

## 2020-11-01 DIAGNOSIS — M546 Pain in thoracic spine: Secondary | ICD-10-CM | POA: Diagnosis not present

## 2020-11-01 DIAGNOSIS — R011 Cardiac murmur, unspecified: Secondary | ICD-10-CM | POA: Insufficient documentation

## 2020-11-01 DIAGNOSIS — Z7952 Long term (current) use of systemic steroids: Secondary | ICD-10-CM | POA: Insufficient documentation

## 2020-11-01 DIAGNOSIS — R079 Chest pain, unspecified: Secondary | ICD-10-CM | POA: Diagnosis not present

## 2020-11-01 LAB — TROPONIN I (HIGH SENSITIVITY)
Troponin I (High Sensitivity): 2 ng/L (ref <18)
Troponin I (High Sensitivity): 2 ng/L (ref <18)

## 2020-11-01 LAB — BASIC METABOLIC PANEL
Anion gap: 6 (ref 5–15)
BUN: 13 mg/dL (ref 6–20)
CO2: 26 mmol/L (ref 22–32)
Calcium: 9.6 mg/dL (ref 8.9–10.3)
Chloride: 106 mmol/L (ref 98–111)
Creatinine, Ser: 0.83 mg/dL (ref 0.44–1.00)
GFR, Estimated: >60 mL/min (ref >60)
Glucose, Bld: 100 mg/dL — ABNORMAL HIGH (ref 70–99)
Potassium: 4.4 mmol/L (ref 3.5–5.1)
Sodium: 138 mmol/L (ref 135–145)

## 2020-11-01 LAB — CBC
HCT: 37.4 % (ref 36.0–46.0)
Hemoglobin: 11.8 g/dL — ABNORMAL LOW (ref 12.0–15.0)
MCH: 28.8 pg (ref 26.0–34.0)
MCHC: 31.6 g/dL (ref 30.0–36.0)
MCV: 91.2 fL (ref 80.0–100.0)
Platelets: 325 10*3/uL (ref 150–400)
RBC: 4.1 MIL/uL (ref 3.87–5.11)
RDW: 13.7 % (ref 11.5–15.5)
WBC: 6.4 10*3/uL (ref 4.0–10.5)
nRBC: 0 % (ref 0.0–0.2)

## 2020-11-01 LAB — I-STAT BETA HCG BLOOD, ED (MC, WL, AP ONLY): I-stat hCG, quantitative: <5 m[IU]/mL (ref <5)

## 2020-11-01 LAB — HEPATIC FUNCTION PANEL
ALT: 14 U/L (ref 0–44)
AST: 18 U/L (ref 15–41)
Albumin: 3.4 g/dL — ABNORMAL LOW (ref 3.5–5.0)
Alkaline Phosphatase: 50 U/L (ref 38–126)
Bilirubin, Direct: <0.1 mg/dL (ref 0.0–0.2)
Total Bilirubin: 0.7 mg/dL (ref 0.3–1.2)
Total Protein: 6.7 g/dL (ref 6.5–8.1)

## 2020-11-01 LAB — LIPASE, BLOOD: Lipase: 25 U/L (ref 11–51)

## 2020-11-01 MED ORDER — ONDANSETRON HCL 4 MG/2ML IJ SOLN
4.0000 mg | Freq: Once | INTRAMUSCULAR | Status: AC
  Administered 2020-11-01: 4 mg via INTRAVENOUS
  Filled 2020-11-01: qty 2

## 2020-11-01 MED ORDER — PANTOPRAZOLE SODIUM 40 MG IV SOLR
40.0000 mg | Freq: Once | INTRAVENOUS | Status: AC
  Administered 2020-11-01: 40 mg via INTRAVENOUS
  Filled 2020-11-01: qty 40

## 2020-11-01 MED ORDER — IOHEXOL 350 MG/ML SOLN
100.0000 mL | Freq: Once | INTRAVENOUS | Status: AC | PRN
  Administered 2020-11-01: 100 mL via INTRAVENOUS

## 2020-11-01 MED ORDER — PANTOPRAZOLE SODIUM 20 MG PO TBEC: 20.0000 mg | DELAYED_RELEASE_TABLET | Freq: Every day | ORAL | 0 refills | Status: DC

## 2020-11-01 MED ORDER — HYDROMORPHONE HCL 1 MG/ML IJ SOLN
0.5000 mg | Freq: Once | INTRAMUSCULAR | Status: AC
  Administered 2020-11-01: 0.5 mg via INTRAVENOUS
  Filled 2020-11-01: qty 1

## 2020-11-01 MED ORDER — FENTANYL CITRATE (PF) 100 MCG/2ML IJ SOLN
75.0000 ug | Freq: Once | INTRAMUSCULAR | Status: AC
  Administered 2020-11-01: 75 ug via INTRAVENOUS
  Filled 2020-11-01: qty 2

## 2020-11-01 MED ORDER — KETOROLAC TROMETHAMINE 15 MG/ML IJ SOLN
15.0000 mg | Freq: Once | INTRAMUSCULAR | Status: AC
  Administered 2020-11-01: 15 mg via INTRAVENOUS
  Filled 2020-11-01: qty 1

## 2020-11-01 MED ORDER — FAMOTIDINE 20 MG PO TABS: 20.0000 mg | ORAL_TABLET | Freq: Two times a day (BID) | ORAL | 0 refills | Status: DC

## 2020-11-01 NOTE — ED Triage Notes (Signed)
Pt reports that she woke up with back pain that radiates to CP and SOB, denies n/v

## 2020-11-01 NOTE — ED Provider Notes (Signed)
[facility] EMERGENCY DEPARTMENT Provider Note   CSN: [id number] Arrival date & time: 11/01/20  0505     History Chief Complaint  Patient presents with  . Chest Pain    [name] is a 55 y.o. female with a history of TIA, CVA in [place], hyperlipidemia, innocent heart murmur who presents to the emergency department with a chief complaint of chest pain.  The patient reports that she awoke from sleep with sharp, shooting pain between her shoulder blades that radiated through to the left chest below her breast.  Initially, she thought that she had slept wrong, but attempted to change her sleeping position several times and was unable to go back to sleep due to her symptoms.  Pain has been constant and worsening since onset.  Pain is significantly worse with laying supine and improved with sitting upright.  Pain is also pleuritic.  No other known aggravating or alleviating factors.  No history of similar pain.    She reports associated shortness of breath.  No other associated symptoms including fever, chills, palpitations, dizziness, lightheadedness, headache, diaphoresis, leg swelling, cough, URI symptoms, nausea, vomiting, diarrhea, constipation, abdominal pain, flank pain.  The patient does report that she went camping over the weekend.  She was able to hike at least a mile and was not having any chest pain or shortness of breath.  No falls or injuries.  No recent long travel.  She did have foot surgery in December 2021, but no other recent surgery or immobilization.  She is very active at baseline.  She does not take OCPs.  No personal or familial history of VTE.  She has been compliant with all of her home medications.  She does report that she was started on doxycycline by her dermatologist about a month ago for alopecia.  However, she is only prescribed this medication once a day and takes it in the middle of the day.  She does not take the medication at bedtime.   No other recent medication changes.  She had a normal cardiac catheterization in 2017.  Per chart review, family member passed away from sudden cardiac death and the patient followed up with Dr. Duke Salvia, cardiology, earlier this year to repeat an echo as close family members were all advised to follow-up with cardiology to be evaluated for hypertrophic cardiomyopathy.  She had a normal ECHO in May 2021.  No history of peptic ulcer disease.  She denies regular NSAID use.  Occasional alcohol use.  No cocaine use or other illicit or recreational substance use.  The history is provided by the patient and medical records. No language interpreter was used.       Past Medical History:  Diagnosis Date  . Allergy-induced asthma    no current med.  . Complication of anesthesia    states is hard to get to sleep and hard to wake up  . History of CVA (cerebrovascular accident) 2004   states no deficits  . Innocent heart murmur    per pt.  . Medial meniscus tear 08/2016   left knee  . Seasonal allergies     Patient Active Problem List   Diagnosis Date Noted  . Right wrist pain 04/08/2018  . Encntr for general adult medical exam w/o abnormal findings 05/21/2017  . Menopausal syndrome 05/21/2017  . Palpitations 05/21/2017  . Weight increased 05/21/2017  . Allergy to pollen 10/10/2016  . Migraine headache 10/10/2016  . Nasal congestion 10/10/2016  . Referred otalgia  of both ears 10/10/2016  . Panic attack 11/29/2015  . Anxiety state 11/29/2015  . Chest pain 11/28/2015    Past Surgical History:  Procedure Laterality Date  . ABDOMINAL HYSTERECTOMY    . CARDIAC CATHETERIZATION N/A 11/28/2015   Procedure: Left Heart Cath and Coronary Angiography;  Surgeon: Yates DecampJay Ganji, MD;  [facility];  Service: Cardiovascular;  Laterality: N/A;  . CYSTOSCOPY  09/29/2007  . KNEE ARTHROSCOPY Right 05/2016  . KNEE ARTHROSCOPY Left 1994  . KNEE ARTHROSCOPY Left 09/05/2016   Procedure:  ARTHROSCOPY KNEE WITH PLICAECTOMY;  Surgeon: Gean BirchwoodFrank Rowan, MD;  Location: [facility];  Service: Orthopedics;  Laterality: Left;  . LAPAROSCOPIC HYSTERECTOMY  09/29/2007   partial  . TUBAL LIGATION     x 2     OB History   No obstetric history on file.     Family History  Problem Relation Age of Onset  . Hypertension Mother   . Diabetes Mother   . Hyperlipidemia Mother   . Pancreatic cancer Father   . Diabetes Sister   . Hypertension Sister   . Hyperlipidemia Sister     Social History   Tobacco Use  . Smoking status: Never Smoker  . Smokeless tobacco: Never Used  Vaping Use  . Vaping Use: Never used  Substance Use Topics  . Alcohol use: Yes    Alcohol/week: 0.0 standard drinks    Comment: occasionally  . Drug use: No    Home Medications Prior to Admission medications   Medication Sig Start Date End Date Taking? Authorizing Provider  Calcium-Vitamin D-Vitamin K [phone] MG-UNT-MCG TABS Take 1 tablet by mouth daily.   Yes [provider]  cetirizine (ZYRTEC) 10 MG tablet Take 10 mg by mouth daily.   Yes [provider]  Cholecalciferol 50 MCG (2000 UT) CAPS Take 2,000 Units by mouth daily.   Yes [provider]  doxycycline (VIBRAMYCIN) 100 MG capsule Take 100 mg by mouth daily at 2 PM. 09/29/20  Yes [provider]  fluticasone (FLONASE) 50 MCG/ACT nasal spray Place 1 spray into both nostrils in the morning and at bedtime.   Yes [provider]  Multiple Vitamins-Minerals (MULTIVITAMIN WOMEN 50+ PO) Take 1 tablet by mouth daily.   Yes [provider]  NONFORMULARY OR COMPOUNDED ITEM Take 5 mLs by mouth at bedtime. Previden rinse 0.2% sodium fluoride 05/30/20  Yes [provider]  rosuvastatin (CRESTOR) 10 MG tablet TAKE 1 TABLET BY MOUTH EVERYDAY AT BEDTIME Patient taking differently: Take 10 mg by mouth daily. 04/22/20  Yes Dorothyann PengSanders, Robyn, MD  XIIDRA 5 % SOLN Apply 1 drop to eye 2 (two) times  daily.  04/30/17  Yes [provider]  ketoconazole (NIZORAL) 2 % cream Apply 1 application topically 3 (three) times a week.    [provider]    Allergies    Other and Tomato  Review of Systems   Review of Systems  Constitutional: Negative for activity change, chills, fatigue and fever.  HENT: Negative for sore throat.   Eyes: Negative for visual disturbance.  Respiratory: Positive for shortness of breath. Negative for wheezing.   Cardiovascular: Positive for chest pain. Negative for palpitations.  Gastrointestinal: Negative for abdominal pain, diarrhea, nausea and vomiting.  Endocrine: Negative for polyuria.  Genitourinary: Negative for dysuria and urgency.  Musculoskeletal: Negative for back pain, myalgias, neck pain and neck stiffness.  Skin: Negative for rash.  Allergic/Immunologic: Negative for immunocompromised state.  Neurological: Negative for seizures, syncope,  weakness, numbness and headaches.  Psychiatric/Behavioral: Negative for confusion.    Physical Exam Updated Vital Signs BP 119/80   Pulse 65   Temp 99.2 F (37.3 C) (Oral)   Resp 17   SpO2 100%   Physical Exam Vitals and nursing note reviewed.  Constitutional:      General: She is not in acute distress.    Comments: Patient is sitting with the head of the bed at 90 degrees.  She is intermittently crying out and severe pain and appears uncomfortable.  HENT:     Head: Normocephalic.     Mouth/Throat:     Mouth: Mucous membranes are moist.  Eyes:     Conjunctiva/sclera: Conjunctivae normal.  Cardiovascular:     Rate and Rhythm: Normal rate and regular rhythm.     Heart sounds: Murmur heard.  No friction rub. No gallop.      Comments: 2+ peripheral pulses to the upper and lower extremities. Pulmonary:     Effort: Pulmonary effort is normal. No respiratory distress.     Breath sounds: No stridor. No wheezing, rhonchi or rales.     Comments: Lungs are clear to auscultation bilaterally.   Patient is able to speak in complete, fluent sentences without increased work of breathing.  No reproducible tenderness to palpation to the chest wall. Chest:     Chest wall: No tenderness.  Abdominal:     General: There is no distension.     Palpations: Abdomen is soft. There is no mass.     Tenderness: There is no abdominal tenderness. There is no right CVA tenderness, left CVA tenderness, guarding or rebound.     Hernia: No hernia is present.     Comments: Abdomen is soft, nontender, nondistended.  Musculoskeletal:     Cervical back: Neck supple.     Right lower leg: No edema.     Left lower leg: No edema.     Comments: Spine is nontender without crepitus or step-offs.  No reproducible tenderness palpation to the back.  Skin:    General: Skin is warm.     Findings: No rash.  Neurological:     General: No focal deficit present.     Mental Status: She is alert.  Psychiatric:        Behavior: Behavior normal.     ED Results / Procedures / Treatments   Labs (all labs ordered are listed, but only abnormal results are displayed) Labs Reviewed  BASIC METABOLIC PANEL - Abnormal; Notable for the following components:      Result Value   Glucose, Bld 100 (*)    All other components within normal limits  CBC - Abnormal; Notable for the following components:   Hemoglobin 11.8 (*)    All other components within normal limits  I-STAT BETA HCG BLOOD, ED ([facility], [facility], [facility] ONLY)  TROPONIN I (HIGH SENSITIVITY)  TROPONIN I (HIGH SENSITIVITY)    EKG EKG Interpretation  Date/Time:  Tuesday November 01 2020 05:11:31 EST Ventricular Rate:  85 PR Interval:  150 QRS Duration: 72 QT Interval:  352 QTC Calculation: 418 R Axis:   90 Text Interpretation: Normal sinus rhythm Rightward axis Cannot rule out Anterior infarct , age undetermined T wave abnormality, consider inferolateral ischemia Abnormal ECG No significant change since last tracing Reconfirmed by Drema Pry [phone]) on 11/01/2020 5:33:20  AM   Radiology DG Chest 2 View  Result Date: 11/01/2020 CLINICAL DATA:  Chest pain. EXAM: CHEST - 2 VIEW COMPARISON:  Chest x-ray  11/28/2015. FINDINGS: Mediastinum and hilar structures normal. Heart size stable. No focal infiltrate. No pleural effusion or pneumothorax. No acute bony abnormality. IMPRESSION: No acute cardiopulmonary disease. Electronically Signed   By: Maisie Fus  Register   On: 11/01/2020 06:03    Procedures Procedures   Medications Ordered in ED Medications  fentaNYL (SUBLIMAZE) injection 75 mcg (75 mcg Intravenous Given 11/01/20 0643)  ondansetron (ZOFRAN) injection 4 mg (4 mg Intravenous Given 11/01/20 0642)  pantoprazole (PROTONIX) injection 40 mg (40 mg Intravenous Given 11/01/20 0643)  iohexol (OMNIPAQUE) 350 MG/ML injection 100 mL (100 mLs Intravenous Contrast Given 11/01/20 0739)    ED Course  I have reviewed the triage vital signs and the nursing notes.  Pertinent labs & imaging results that were available during my care of the patient were reviewed by me and considered in my medical decision making (see chart for details).    MDM Rules/Calculators/A&P                          55 year old female with a history of TIA, CVA in [place], hyperlipidemia, innocent heart murmur who presents the emergency department from home with a chief complaint of chest pain that radiates through to her back accompanied by shortness of breath awoke her from sleep.  No history of similar.  No other associated symptoms.  Vital signs are stable.  On physical exam, patient is sitting with the head of the bed upright at 90 degrees and has intermittently crying out in severe pain.  No other focal exam findings.  Labs and imaging of been reviewed and independently interpreted by me.  EKG with sinus rhythm and unchanged from previous.  Initial troponin is not elevated.  Patient is anemic at 11.8, unchanged from previous.  No metabolic derangements.  Chest x-ray is unremarkable.  Differential  diagnosis includes aortic dissection, PE, esophagitis, pill-induced esophagitis, peptic ulcer disease, ACS, esophageal spasms, esophageal rupture, tension pneumothorax.  I am less suspicious for musculoskeletal etiology.  We will plan to order aortic dissection study.  Although she is endorsing pleuritic chest pain, I am more suspicious for aortic dissection.  CTA has been ordered.  Will give fentanyl and Zofran for pain.  She has been taking doxycycline for the last month.  Although dosing is in the middle of the day, could consider pill-induced esophagitis.  We will give a dose of Protonix to see if symptoms improve.  Her second troponin is pending.  Patient care transferred to PA Geiple at the end of my shift to follow-up on CTA, repeat troponin, reevaluation patient. Patient presentation, ED course, and plan of care discussed with review of all pertinent labs and imaging. Please see his/her note for further details regarding further ED course and disposition.    Final Clinical Impression(s) / ED Diagnoses Final diagnoses:  None    Rx / DC Orders ED Discharge Orders    None       Barkley Boards, PA-C 11/01/20 0749    Nira Conn, MD 11/01/20 1757

## 2020-11-01 NOTE — Discharge Instructions (Signed)
Please read and follow all provided instructions.  Your diagnoses today include:  1. Precordial pain   2. Acute left-sided thoracic back pain     Tests performed today include:  An EKG of your heart  A chest x-ray  Cardiac enzymes - a blood test for heart muscle damage  Blood counts and electrolytes  CT scan of your chest, abdomen, and pelvis -does not show any signs of blood clot, tears in the low vessels, or other problems which would explain your pain today  Vital signs. See below for your results today.   Medications prescribed:   Pantoprazole (Protonix) - stomach acid reducer   Pepcid (famotidine) - antihistamine  You can find this medication over-the-counter.   DO NOT exceed:   20mg  Pepcid every 12 hours  Take any prescribed medications only as directed.  Follow-up instructions: Please follow-up with your primary care provider as soon as you can for further evaluation of your symptoms.   Return instructions:  SEEK IMMEDIATE MEDICAL ATTENTION IF:  You have severe chest pain, especially if the pain is crushing or pressure-like and spreads to the arms, back, neck, or jaw, or if you have sweating, nausea (feeling sick to your stomach), or shortness of breath. THIS IS AN EMERGENCY. Don't wait to see if the pain will go away. Get medical help at once. Call 911 or [phone] (operator). DO NOT drive yourself to the hospital.   Your chest pain gets worse and does not go away with rest.   You have an attack of chest pain lasting longer than usual, despite rest and treatment with the medications your caregiver has prescribed.   You wake from sleep with chest pain or shortness of breath.  You feel dizzy or faint.  You have chest pain not typical of your usual pain for which you originally saw your caregiver.   You have any other emergent concerns regarding your health.  Additional Information: Chest pain comes from many different causes. Your caregiver has diagnosed you as  having chest pain that is not specific for one problem, but does not require admission.  You are at low risk for an acute heart condition or other serious illness.   Your vital signs today were: BP 118/68   Pulse (!) 59   Temp 99.2 F (37.3 C) (Oral)   Resp 14   SpO2 100%  If your blood pressure (BP) was elevated above 135/85 this visit, please have this repeated by your doctor within one month. --------------

## 2020-11-01 NOTE — ED Provider Notes (Signed)
7:00 AM Signout from [employer] at shift change.   Pt with pleuritic CP, radiating to back, SOB -- starting this AM.   Plan: CT dissection, 2nd trop, reassess.   9:01 AM patient reassessed. Updated on results. She states that the pain is much improved from what it was, however it is now returning. Additional pain medications ordered. She describes pain is worse with deep breathing that starts underneath her left breast and radiates up through her left shoulder blade. Pain has been excruciating at times. Fortunately, her CT imaging does not show any evidence of dissection, PE, pneumonia, pneumothorax or other life-threatening or dangerous etiology. Patient denies history of GERD or esophageal problems. Currently, second troponin, hepatic function panel and lipase are pending.  BP 113/72   Pulse (!) 45   Temp 99.2 F (37.3 C) (Oral)   Resp 18   SpO2 100%   12:01 PM there was a delay in the lab work being run.  Now completed.  Patient rechecked and continues to do well.  She was given crackers and ginger ale by myself.  Pain continues to be controlled.  Plan for d/c, close PCP follow-up. Will give trial of pepcid and Protonix.   Patient was counseled to return with severe chest pain, especially if the pain is crushing or pressure-like and spreads to the arms, back, neck, or jaw, or if they have sweating, nausea, or shortness of breath with the pain. They were encouraged to call 911 with these symptoms.   The patient verbalized understanding and agreed.    MDM: Patient here with chest pain radiating to the back.  She was very uncomfortable on arrival.  Patient was evaluated for dissection with CT angiography of the chest, abdomen, and pelvis.  This was negative.  No signs of aneurysm.  No sign of PE.  Patient was evaluated for ACS with serial troponins both of which were negative.  Patient has an abnormal EKG which is unchanged from her baseline.  Remainder of her lab work-up is reassuring.   Possible etiologies include GI, musculoskeletal.  Symptoms adequately controlled in the emergency department and patient will be discharged home with strict return instructions as discussed above.   Results for orders placed or performed during the hospital encounter of 11/01/20  Basic metabolic panel  Result Value Ref Range   Sodium 138 135 - 145 mmol/L   Potassium 4.4 3.5 - 5.1 mmol/L   Chloride 106 98 - 111 mmol/L   CO2 26 22 - 32 mmol/L   Glucose, Bld 100 (H) 70 - 99 mg/dL   BUN 13 6 - 20 mg/dL   Creatinine, Ser 2.95 0.44 - 1.00 mg/dL   Calcium 9.6 8.9 - 62.1 mg/dL   GFR, Estimated >30 >86 mL/min   Anion gap 6 5 - 15  CBC  Result Value Ref Range   WBC 6.4 4.0 - 10.5 K/uL   RBC 4.10 3.87 - 5.11 MIL/uL   Hemoglobin 11.8 (L) 12.0 - 15.0 g/dL   HCT 57.8 46.9 - 62.9 %   MCV 91.2 80.0 - 100.0 fL   MCH 28.8 26.0 - 34.0 pg   MCHC 31.6 30.0 - 36.0 g/dL   RDW 52.8 41.3 - 24.4 %   Platelets 325 150 - 400 K/uL   nRBC 0.0 0.0 - 0.2 %  Lipase, blood  Result Value Ref Range   Lipase 25 11 - 51 U/L  Hepatic function panel  Result Value Ref Range   Total Protein 6.7 6.5 -  8.1 g/dL   Albumin 3.4 (L) 3.5 - 5.0 g/dL   AST 18 15 - 41 U/L   ALT 14 0 - 44 U/L   Alkaline Phosphatase 50 38 - 126 U/L   Total Bilirubin 0.7 0.3 - 1.2 mg/dL   Bilirubin, Direct <9.7 0.0 - 0.2 mg/dL   Indirect Bilirubin NOT CALCULATED 0.3 - 0.9 mg/dL  I-Stat beta hCG blood, ED  Result Value Ref Range   I-stat hCG, quantitative <5.0 <5 mIU/mL   Comment 3          Troponin I (High Sensitivity)  Result Value Ref Range   Troponin I (High Sensitivity) 2 <18 ng/L  Troponin I (High Sensitivity)  Result Value Ref Range   Troponin I (High Sensitivity) 2 <18 ng/L   DG Chest 2 View  Result Date: 11/01/2020 CLINICAL DATA:  Chest pain. EXAM: CHEST - 2 VIEW COMPARISON:  Chest x-ray 11/28/2015. FINDINGS: Mediastinum and hilar structures normal. Heart size stable. No focal infiltrate. No pleural effusion or pneumothorax.  No acute bony abnormality. IMPRESSION: No acute cardiopulmonary disease. Electronically Signed   By: Maisie Fus  Register   On: 11/01/2020 06:03   DG Foot Complete Right  Result Date: 10/11/2020 Please see detailed radiograph report in office note.  CT Angio Chest/Abd/Pel for Dissection W and/or Wo Contrast  Result Date: 11/01/2020 CLINICAL DATA:  Chest and abdominal pain radiating to back EXAM: CT ANGIOGRAPHY CHEST, ABDOMEN AND PELVIS TECHNIQUE: Non-contrast CT of the chest was initially obtained. Multidetector CT imaging through the chest, abdomen and pelvis was performed using the standard protocol during bolus administration of intravenous contrast. Multiplanar reconstructed images and MIPs were obtained and reviewed to evaluate the vascular anatomy. CONTRAST:  OMNIPAQUE IOHEXOL 350 MG/ML SOLN COMPARISON:  Chest radiograph November 01, 2020 FINDINGS: CTA CHEST FINDINGS Cardiovascular: There is no demonstrable thoracic aortic intramural hematoma on the noncontrast enhanced examination. There is no demonstrable mediastinal hematoma. There is no appreciable thoracic aortic aneurysm or dissection. No appreciable ulceration or plaque noted in the thoracic aorta. Visualized great vessels appear unremarkable. There is no demonstrable pulmonary embolus. There is no pericardial thickening or pericardial effusion evident. Mediastinum/Nodes: There is a 6 mm nodular opacity in the right lobe of the thyroid which per consensus guidelines does not warrant additional imaging surveillance. There is no appreciable thoracic adenopathy. No esophageal lesions are evident. Lungs/Pleura: There is mild scarring in the lung apices. There is slight bibasilar atelectasis. No edema or airspace opacity. No pleural effusions are evident. On axial slice 26 series 7, there is a 2 mm nodular opacity in the posterior aspect of the apical segment of the left upper lobe. Musculoskeletal: No fracture or dislocation. No blastic or lytic bone  lesions evident. No evident chest wall lesion. Review of the MIP images confirms the above findings. CTA ABDOMEN AND PELVIS FINDINGS VASCULAR Aorta: There is no abdominal aortic aneurysm or dissection. No appreciable atherosclerotic plaque is noted within the abdominal aorta. Celiac: Celiac artery and its branches are widely patent. No aneurysm or dissection involving these vessels. SMA: Superior mesenteric artery and its branches are widely patent. No aneurysm or dissection involving these vessels. Renals: There is a single renal artery on each side. No aneurysm or dissection involving the renal arteries or branches. No fibromuscular dysplasia evident. Renal arteries and branches are widely patent throughout their respective courses. IMA: Inferior mesenteric artery and its branches are widely patent. No aneurysm or dissection involving these vessels. Inflow: Pelvic arterial vessels are widely patent throughout  their respective courses. No aneurysm or dissection involving these vessels. Proximal superficial femoral and profunda femoral arteries are also patent without aneurysm or dissection. Veins: No obvious venous abnormality within the limitations of this arterial phase study. Review of the MIP images confirms the above findings. NON-VASCULAR Hepatobiliary: No focal liver lesions are appreciable. Gallbladder wall is not appreciably thickened. There is no biliary duct dilatation. Pancreas: There is no appreciable pancreatic mass or inflammatory focus. Spleen: No splenic lesions evident on arterial phase imaging. Adrenals/Urinary Tract: Adrenals bilaterally appear unremarkable. There is no appreciable renal mass or hydronephrosis on either side. There is no appreciable renal or ureteral calculus on either side. Urinary bladder is midline with wall thickness within normal limits. Stomach/Bowel: There is no appreciable bowel wall or mesenteric thickening. Terminal ileum appears normal. No evident bowel obstruction.  No appreciable free air or portal venous air. Lymphatic: No adenopathy is evident in the abdomen or pelvis. Reproductive: Uterus absent.  No adnexal region masses. Other: Appendix appears normal. No abscess or ascites evident in the abdomen or pelvis. There is slight fat in the umbilicus. Musculoskeletal: There is degenerative change in the lumbar spine with vacuum phenomenon at L5-S1. No fracture or dislocation appreciable. No blastic or lytic bone lesions. No intramuscular lesions are evident. Review of the MIP images confirms the above findings. IMPRESSION: CT angiogram chest: 1. No thoracic aortic aneurysm or dissection. No appreciable atherosclerotic plaque or ulceration noted in the aorta. 2.  No evident pulmonary embolus. 3. Areas of mild scarring and atelectasis. No edema or airspace opacity. 2 mm nodular opacity in left upper lobe. No follow-up needed if patient is low-risk. Non-contrast chest CT can be considered in 12 months if patient is high-risk. This recommendation follows the consensus statement: Guidelines for Management of Incidental Pulmonary Nodules Detected on CT Images: From the Fleischner Society 2017; Radiology 2017; 284:228-243. 4.  No evident adenopathy. CT angiogram abdomen; CT angiogram pelvis: 1. No evident aneurysm or dissection involving the aorta, major mesenteric, and major pelvic arterial vessels. No evident fibromuscular dysplasia. No appreciable atherosclerotic plaque noted in the arterial vessels in the abdomen and pelvis. 2. No bowel wall thickening or bowel obstruction. No abscess in the abdomen or pelvis. Appendix appears normal. 3.  Uterus absent. 4. No evident renal or ureteral calculus. No hydronephrosis. Urinary bladder wall thickness within normal limits. Electronically Signed   By: Bretta Bang III M.D.   On: 11/01/2020 08:01      Renne Crigler, PA-C 11/01/20 1205    Milagros Loll, MD 11/02/20 [phone]

## 2020-11-02 ENCOUNTER — Encounter: Payer: Self-pay | Admitting: Internal Medicine

## 2020-11-03 ENCOUNTER — Other Ambulatory Visit: Payer: Self-pay

## 2020-11-03 ENCOUNTER — Encounter: Payer: Self-pay | Admitting: Nurse Practitioner

## 2020-11-03 ENCOUNTER — Ambulatory Visit: Payer: Federal, State, Local not specified - PPO | Admitting: Nurse Practitioner

## 2020-11-03 VITALS — BP 110/64 | HR 73 | Temp 98.6°F | Ht 68.6 in | Wt 154.6 lb

## 2020-11-03 DIAGNOSIS — R7309 Other abnormal glucose: Secondary | ICD-10-CM | POA: Diagnosis not present

## 2020-11-03 DIAGNOSIS — K219 Gastro-esophageal reflux disease without esophagitis: Secondary | ICD-10-CM | POA: Diagnosis not present

## 2020-11-03 DIAGNOSIS — M546 Pain in thoracic spine: Secondary | ICD-10-CM | POA: Diagnosis not present

## 2020-11-03 LAB — HEMOGLOBIN A1C
Est. average glucose Bld gHb Est-mCnc: 126 mg/dL
Hgb A1c MFr Bld: 6 % — ABNORMAL HIGH (ref 4.8–5.6)

## 2020-11-03 NOTE — Progress Notes (Signed)
I,Tianna Badgett,acting as a Neurosurgeon for [employer], NP.,have documented all relevant documentation on the behalf of [employer], NP,as directed by  Charlesetta Ivory, NP while in the presence of Charlesetta Ivory, NP.  This visit occurred during the SARS-CoV-2 public health emergency.  Safety protocols were in place, including screening questions prior to the visit, additional usage of staff PPE, and extensive cleaning of exam room while observing appropriate contact time as indicated for disinfecting solutions.  Subjective:     Patient ID: [name] , female    DOB: 25-May-1966 , 55 y.o.   MRN: [id number]   Chief Complaint  Patient presents with  . Back Pain    HPI  Patient is here for back pain. She was seen in the ED for this pain. She states that she is better but is still uncomfortable and feels the pain radiates from her left shoulder blade to her front chest. Went over lab work and CT results with the patient. She is unsure where the pain is coming from or why she is getting it.   Back Pain This is a new problem. The current episode started in the past 7 days. The problem occurs intermittently. The problem has been gradually improving since onset. The pain is present in the thoracic spine. The quality of the pain is described as stabbing. The pain does not radiate. The pain is at a severity of 9/10. The pain is severe. The pain is worse during the night. The symptoms are aggravated by lying down. Associated symptoms include chest pain. Pertinent negatives include no fever. She has tried nothing for the symptoms.     Past Medical History:  Diagnosis Date  . Allergy-induced asthma    no current med.  . Complication of anesthesia    states is hard to get to sleep and hard to wake up  . History of CVA (cerebrovascular accident) 2004   states no deficits  . Innocent heart murmur    per pt.  . Medial meniscus tear 08/2016   left knee  . Seasonal allergies       Family History  Problem Relation Age of Onset  . Hypertension Mother   . Diabetes Mother   . Hyperlipidemia Mother   . Pancreatic cancer Father   . Diabetes Sister   . Hypertension Sister   . Hyperlipidemia Sister      Current Outpatient Medications:  .  Calcium-Vitamin D-Vitamin K 323 729 4284-90 MG-UNT-MCG TABS, Take 1 tablet by mouth daily., Disp: , Rfl:  .  cetirizine (ZYRTEC) 10 MG tablet, Take 10 mg by mouth daily., Disp: , Rfl:  .  Cholecalciferol 50 MCG (2000 UT) CAPS, Take 2,000 Units by mouth daily., Disp: , Rfl:  .  doxycycline (VIBRAMYCIN) 100 MG capsule, Take 100 mg by mouth daily at 2 PM., Disp: , Rfl:  .  famotidine (PEPCID) 20 MG tablet, Take 1 tablet (20 mg total) by mouth 2 (two) times daily., Disp: 30 tablet, Rfl: 0 .  fluticasone (FLONASE) 50 MCG/ACT nasal spray, Place 1 spray into both nostrils in the morning and at bedtime., Disp: , Rfl:  .  ketoconazole (NIZORAL) 2 % cream, Apply 1 application topically 3 (three) times a week., Disp: , Rfl:  .  Multiple Vitamins-Minerals (MULTIVITAMIN WOMEN 50+ PO), Take 1 tablet by mouth daily., Disp: , Rfl:  .  NONFORMULARY OR COMPOUNDED ITEM, Take 5 mLs by mouth at bedtime. Previden rinse 0.2% sodium fluoride, Disp: , Rfl:  .  pantoprazole (PROTONIX) 20  MG tablet, Take 1 tablet (20 mg total) by mouth daily., Disp: 30 tablet, Rfl: 0 .  rosuvastatin (CRESTOR) 10 MG tablet, TAKE 1 TABLET BY MOUTH EVERYDAY AT BEDTIME (Patient taking differently: Take 10 mg by mouth daily.), Disp: 90 tablet, Rfl: 2 .  XIIDRA 5 % SOLN, Apply 1 drop to eye 2 (two) times daily. , Disp: , Rfl: 3   Allergies  Allergen Reactions  . Other     Other reaction(s): Respiratory Distress Other reaction(s): Respiratory Distress Other reaction(s): Eye Redness  . Tomato Anaphylaxis     Review of Systems  Constitutional: Negative for chills, fatigue, fever and unexpected weight change.  Respiratory: Negative for cough, choking, shortness of breath and  wheezing.   Cardiovascular: Positive for chest pain. Negative for palpitations.  Musculoskeletal: Positive for back pain and myalgias.       Left upper back shoulder pain radiating to the front      Today's Vitals   11/03/20 0901  BP: 110/64  Pulse: 73  Temp: 98.6 F (37 C)  TempSrc: Oral  Weight: 154 lb 9.6 oz (70.1 kg)  Height: 5' 8.6" (1.742 m)  PainSc: 8    Body mass index is 23.1 kg/m.  Wt Readings from Last 3 Encounters:  11/03/20 154 lb 9.6 oz (70.1 kg)  07/27/20 152 lb 9.6 oz (69.2 kg)  07/09/20 134 lb (60.8 kg)    Objective:  Physical Exam Vitals reviewed.  Constitutional:      Appearance: Normal appearance.  HENT:     Head: Normocephalic and atraumatic.  Cardiovascular:     Rate and Rhythm: Normal rate and regular rhythm.     Pulses: Normal pulses.     Heart sounds: Murmur heard.    Pulmonary:     Effort: Pulmonary effort is normal. No respiratory distress.     Breath sounds: Normal breath sounds. No wheezing.     Comments: Lungs clear to ausculation.  Abdominal:     General: There is no distension.     Tenderness: There is no abdominal tenderness.  Musculoskeletal:        General: Tenderness present.     Comments: Tenderness to upper left posterior shoulder. Spine is nontender without crepitus.   Skin:    General: Skin is warm and dry.     Capillary Refill: Capillary refill takes less than 2 seconds.  Neurological:     General: No focal deficit present.     Mental Status: She is alert and oriented to person, place, and time.         Assessment And Plan:     1. Acute left-sided thoracic back pain -Patient recently was examined in the ED. -Lab results reviewed with patient along with CT results.  -Pain likely to be [facility] or GI  - Ambulatory referral to Gastroenterology for further work up and evaluation.   2. Gastroesophageal reflux disease without esophagitis -Continue Protonix 20 mg once daily  -Continue Pepcid 20 mg daily.  - Ambulatory  referral to Gastroenterology   Follow up if symptoms get worse. Educated patient if she experiences shortness of breath or chest pain to go the emergency room.    Patient was given opportunity to ask questions. Patient verbalized understanding of the plan and was able to repeat key elements of the plan. All questions were answered to their satisfaction.  Charlesetta Ivory, NP   I, Charlesetta Ivory, NP, have reviewed all documentation for this visit. The documentation on 11/03/20 for the exam, diagnosis, procedures,  and orders are all accurate and complete.  THE PATIENT IS ENCOURAGED TO PRACTICE SOCIAL DISTANCING DUE TO THE COVID-19 PANDEMIC.

## 2020-11-03 NOTE — Patient Instructions (Signed)
Acute Back Pain, Adult Acute back pain is sudden and usually short-lived. It is often caused by an injury to the muscles and tissues in the back. The injury may result from:  A muscle or ligament getting overstretched or torn (strained). Ligaments are tissues that connect bones to each other. Lifting something improperly can cause a back strain.  Wear and tear (degeneration) of the spinal disks. Spinal disks are circular tissue that provide cushioning between the bones of the spine (vertebrae).  Twisting motions, such as while playing sports or doing yard work.  A hit to the back.  Arthritis. You may have a physical exam, lab tests, and imaging tests to find the cause of your pain. Acute back pain usually goes away with rest and home care. Follow these instructions at home: Managing pain, stiffness, and swelling  Treatment may include medicines for pain and inflammation that are taken by mouth or applied to the skin, prescription pain medicine, or muscle relaxants. Take over-the-counter and prescription medicines only as told by your health care provider.  Your health care provider may recommend applying ice during the first 24-48 hours after your pain starts. To do this: ? Put ice in a plastic bag. ? Place a towel between your skin and the bag. ? Leave the ice on for 20 minutes, 2-3 times a day.  If directed, apply heat to the affected area as often as told by your health care provider. Use the heat source that your health care provider recommends, such as a moist heat pack or a heating pad. ? Place a towel between your skin and the heat source. ? Leave the heat on for 20-30 minutes. ? Remove the heat if your skin turns bright red. This is especially important if you are unable to feel pain, heat, or cold. You have a greater risk of getting burned. Activity  Do not stay in bed. Staying in bed for more than 1-2 days can delay your recovery.  Sit up and stand up straight. Avoid leaning  forward when you sit or hunching over when you stand. ? If you work at a desk, sit close to it so you do not need to lean over. Keep your chin tucked in. Keep your neck drawn back, and keep your elbows bent at a 90-degree angle (right angle). ? Sit high and close to the steering wheel when you drive. Add lower back (lumbar) support to your car seat, if needed.  Take short walks on even surfaces as soon as you are able. Try to increase the length of time you walk each day.  Do not sit, drive, or stand in one place for more than 30 minutes at a time. Sitting or standing for long periods of time can put stress on your back.  Do not drive or use heavy machinery while taking prescription pain medicine.  Use proper lifting techniques. When you bend and lift, use positions that put less stress on your back: ? Bend your knees. ? Keep the load close to your body. ? Avoid twisting.  Exercise regularly as told by your health care provider. Exercising helps your back heal faster and helps prevent back injuries by keeping muscles strong and flexible.  Work with a physical therapist to make a safe exercise program, as recommended by your health care provider. Do any exercises as told by your physical therapist.   Lifestyle  Maintain a healthy weight. Extra weight puts stress on your back and makes it difficult to have   good posture.  Avoid activities or situations that make you feel anxious or stressed. Stress and anxiety increase muscle tension and can make back pain worse. Learn ways to manage anxiety and stress, such as through exercise. General instructions  Sleep on a firm mattress in a comfortable position. Try lying on your side with your knees slightly bent. If you lie on your back, put a pillow under your knees.  Follow your treatment plan as told by your health care provider. This may include: ? Cognitive or behavioral therapy. ? Acupuncture or massage therapy. ? Meditation or yoga. Contact  a health care provider if:  You have pain that is not relieved with rest or medicine.  You have increasing pain going down into your legs or buttocks.  Your pain does not improve after 2 weeks.  You have pain at night.  You lose weight without trying.  You have a fever or chills. Get help right away if:  You develop new bowel or bladder control problems.  You have unusual weakness or numbness in your arms or legs.  You develop nausea or vomiting.  You develop abdominal pain.  You feel faint. Summary  Acute back pain is sudden and usually short-lived.  Use proper lifting techniques. When you bend and lift, use positions that put less stress on your back.  Take over-the-counter and prescription medicines and apply heat or ice as directed by your health care provider. This information is not intended to replace advice given to you by your health care provider. Make sure you discuss any questions you have with your health care provider. Document Revised: 05/06/2020 Document Reviewed: 05/06/2020 Elsevier Patient Education  2021 [employer]  

## 2020-11-08 ENCOUNTER — Encounter: Payer: Self-pay | Admitting: Podiatry

## 2020-11-08 ENCOUNTER — Ambulatory Visit (INDEPENDENT_AMBULATORY_CARE_PROVIDER_SITE_OTHER): Payer: Federal, State, Local not specified - PPO | Admitting: Podiatry

## 2020-11-08 ENCOUNTER — Ambulatory Visit: Payer: Federal, State, Local not specified - PPO | Admitting: Internal Medicine

## 2020-11-08 ENCOUNTER — Other Ambulatory Visit: Payer: Self-pay | Admitting: Podiatry

## 2020-11-08 ENCOUNTER — Ambulatory Visit (INDEPENDENT_AMBULATORY_CARE_PROVIDER_SITE_OTHER): Payer: Federal, State, Local not specified - PPO

## 2020-11-08 ENCOUNTER — Encounter: Payer: Self-pay | Admitting: Internal Medicine

## 2020-11-08 ENCOUNTER — Other Ambulatory Visit: Payer: Self-pay

## 2020-11-08 VITALS — BP 118/78 | HR 70 | Temp 98.1°F | Ht 68.6 in | Wt 156.6 lb

## 2020-11-08 DIAGNOSIS — R0789 Other chest pain: Secondary | ICD-10-CM

## 2020-11-08 DIAGNOSIS — R7309 Other abnormal glucose: Secondary | ICD-10-CM | POA: Diagnosis not present

## 2020-11-08 DIAGNOSIS — M2041 Other hammer toe(s) (acquired), right foot: Secondary | ICD-10-CM

## 2020-11-08 DIAGNOSIS — M21621 Bunionette of right foot: Secondary | ICD-10-CM

## 2020-11-08 DIAGNOSIS — Z9889 Other specified postprocedural states: Secondary | ICD-10-CM

## 2020-11-08 DIAGNOSIS — M489 Spondylopathy, unspecified: Secondary | ICD-10-CM | POA: Diagnosis not present

## 2020-11-08 NOTE — Progress Notes (Signed)
She presents today for postop visit date of surgery December 3 fifth metatarsal osteotomy and hammertoe repair fifth right.  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  But all in all she is doing much better.  Objective: Vital signs are stable she alert oriented x3 there is no erythema edema cellulitis drainage odor she has no pain on palpation.  Radiographs confirm well-healing fifth met osteotomy and hammertoe.  Assessment: Well-healing surgical foot.  Plan: Follow-up with me on an and needed basis.  We will allow her to go back to work full-time no restrictions

## 2020-11-08 NOTE — Progress Notes (Signed)
I,[name],acting as a Neurosurgeon for [name], MD.,have documented all relevant documentation on the behalf of [name], MD,as directed by  [name], MD while in the presence of [name], MD.  This visit occurred during the SARS-CoV-2 public health emergency.  Safety protocols were in place, including screening questions prior to the visit, additional usage of staff PPE, and extensive cleaning of exam room while observing appropriate contact time as indicated for disinfecting solutions.  Subjective:     Patient ID: [name] , female    DOB: [birth year] , 55 y.o.   MRN: [id number]   Chief Complaint  Patient presents with  . abnormal glucose    HPI  The patient is here today for a f/u of prediabetes. She has not been able to exercise as much as she has in the past. She had foot surgery Dec 2021. She is ready to start a regular exercise regimen.     Past Medical History:  Diagnosis Date  . Allergy-induced asthma    no current med.  . Complication of anesthesia    states is hard to get to sleep and hard to wake up  . History of CVA (cerebrovascular accident) 2004   states no deficits  . Innocent heart murmur    per pt.  . Medial meniscus tear 08/2016   left knee  . Seasonal allergies      Family History  Problem Relation Age of Onset  . Hypertension Mother   . Diabetes Mother   . Hyperlipidemia Mother   . Pancreatic cancer Father   . Diabetes Sister   . Hypertension Sister   . Hyperlipidemia Sister      Current Outpatient Medications:  .  Calcium-Vitamin D-Vitamin K [id number] MG-UNT-MCG TABS, Take 1 tablet by mouth daily., Disp: , Rfl:  .  cetirizine (ZYRTEC) 10 MG tablet, Take 10 mg by mouth daily., Disp: , Rfl:  .  Cholecalciferol 50 MCG (2000 UT) CAPS, Take 2,000 Units by mouth daily., Disp: , Rfl:  .  doxycycline (VIBRAMYCIN) 100 MG capsule, Take 100 mg by mouth daily at 2 PM., Disp: , Rfl:  .  famotidine (PEPCID) 20 MG  tablet, Take 1 tablet (20 mg total) by mouth 2 (two) times daily., Disp: 30 tablet, Rfl: 0 .  fluticasone (FLONASE) 50 MCG/ACT nasal spray, Place 1 spray into both nostrils in the morning and at bedtime., Disp: , Rfl:  .  ketoconazole (NIZORAL) 2 % cream, Apply 1 application topically 3 (three) times a week., Disp: , Rfl:  .  Multiple Vitamins-Minerals (MULTIVITAMIN WOMEN 50+ PO), Take 1 tablet by mouth daily., Disp: , Rfl:  .  NONFORMULARY OR COMPOUNDED ITEM, Take 5 mLs by mouth at bedtime. Previden rinse 0.2% sodium fluoride, Disp: , Rfl:  .  pantoprazole (PROTONIX) 20 MG tablet, Take 1 tablet (20 mg total) by mouth daily., Disp: 30 tablet, Rfl: 0 .  rosuvastatin (CRESTOR) 10 MG tablet, TAKE 1 TABLET BY MOUTH EVERYDAY AT BEDTIME (Patient taking differently: Take 10 mg by mouth daily.), Disp: 90 tablet, Rfl: 2 .  XIIDRA 5 % SOLN, Apply 1 drop to eye 2 (two) times daily. , Disp: , Rfl: 3   Allergies  Allergen Reactions  . Other     Other reaction(s): Respiratory Distress Other reaction(s): Respiratory Distress Other reaction(s): Eye Redness  . Tomato Anaphylaxis     Review of Systems  Constitutional: Negative.   Respiratory: Negative.   Cardiovascular: Negative.   Gastrointestinal:  Negative.   Musculoskeletal: Positive for neck pain.  Psychiatric/Behavioral: Negative.   All other systems reviewed and are negative.    Today's Vitals   11/08/20 1036  BP: 118/78  Pulse: 70  Temp: 98.1 F (36.7 C)  TempSrc: Oral  Weight: 156 lb 9.6 oz (71 kg)  Height: 5' 8.6" (1.742 m)   Body mass index is 23.4 kg/m.  Wt Readings from Last 3 Encounters:  11/08/20 156 lb 9.6 oz (71 kg)  11/03/20 154 lb 9.6 oz (70.1 kg)  07/27/20 152 lb 9.6 oz (69.2 kg)   Objective:  Physical Exam Vitals and nursing note reviewed.  Constitutional:      Appearance: Normal appearance.  HENT:     Head: Normocephalic and atraumatic.     Nose:     Comments: Masked     Mouth/Throat:     Comments: Masked   Cardiovascular:     Rate and Rhythm: Normal rate and regular rhythm.     Heart sounds: Normal heart sounds.  Pulmonary:     Effort: Pulmonary effort is normal.     Breath sounds: Normal breath sounds.  Musculoskeletal:     Cervical back: Normal range of motion. Tenderness present.  Skin:    General: Skin is warm.  Neurological:     General: No focal deficit present.     Mental Status: She is alert and oriented to person, place, and time.         Assessment And Plan:     1. Abnormal glucose Comments: Her a1c has been elevated in the past. I will recheck this today. She is encouraged to avoid sweetened beverages, including diet drinks.   2. Atypical chest pain Comments: 3/8 ER records reviewed in full detail. She will c/w PPI therapy.   3. Cervical spine disease Comments: She agrees to [facility] evaluation. She was also given stretching exercises to perform daily.  - Ambulatory referral to Chiropractic     Patient was given opportunity to ask questions. Patient verbalized understanding of the plan and was able to repeat key elements of the plan. All questions were answered to their satisfaction.   I, [name], MD, have reviewed all documentation for this visit. The documentation on 12/04/20 for the exam, diagnosis, procedures, and orders are all accurate and complete.   IF YOU HAVE BEEN REFERRED TO A SPECIALIST, IT MAY TAKE 1-2 WEEKS TO SCHEDULE/PROCESS THE REFERRAL. IF YOU HAVE NOT HEARD FROM US/SPECIALIST IN TWO WEEKS, PLEASE GIVE [place] A CALL AT [phone].   THE PATIENT IS ENCOURAGED TO PRACTICE SOCIAL DISTANCING DUE TO THE COVID-19 PANDEMIC.

## 2020-11-08 NOTE — Patient Instructions (Addendum)
Magnesium glycinate, 250mg -400mg     Insulin Resistance  Insulin is a hormone that is made by the pancreas. Insulin allows blood sugar (glucose) to enter the cells in the body. Insulin helps the body use glucose for energy. Normally, the body is insulin sensitive, which means the cells in the body are effective at absorbing glucose. Insulin resistance is when the cells in the body do not respond properly to insulin and are not able to absorb glucose. The pancreas makes more insulin, but over time the body cannot make enough insulin to keep glucose at normal levels. Insulin resistance results in high blood glucose levels (hyperglycemia) and can lead to problems, including:  Prediabetes.  Type 2 diabetes (diabetes mellitus).  Heart disease.  High blood pressure (hypertension).  Stroke.  Polycystic ovary syndrome (PCOS).  Nonalcoholic fatty liver disease. What are the causes? The exact cause of insulin resistance is not known. What increases the risk? The following factors may make you more likely to develop insulin resistance:  Being overweight or obese, especially if a lot of your weight is in your waist area.  Having an inactive (sedentary) lifestyle.  Having above-normal glucose levels.  Having abnormal cholesterol levels.  Having sleep apnea.  Being older than age 89.  Using steroids. What are the signs or symptoms? This condition usually does not cause symptoms. A waist measurement of more than 35 inches (88.9 cm) for women and more than 40 inches (101.6 cm) for men may be a sign of insulin resistance. How is this diagnosed? There is no test to diagnose insulin resistance. However, your health care provider may diagnose insulin resistance based on:  A physical exam.  Your medical history.  Blood tests that check your blood glucose level. How is this treated? Insulin resistance is treated with nutrition and lifestyle changes. These changes may include:  Eating a  healthy balance of nutritious foods.  Getting more physical activity.  Maintaining a healthy weight.  Stopping the use of any tobacco products. Your health care provider will work with you to change your nutrition and lifestyle as needed. In some cases, treatment may also include medicine to improve your insulin sensitivity. Follow these instructions at home: Activity  Be physically active. Do moderate-intensity physical activity for at least 30 minutes on 5 or more days of the week, or as told by your health care provider. This could include brisk walking, biking, or water aerobics.  Ask your health care provider what activities are safe for you. A mix of physical activities may be best, such as walking, swimming, biking, and strength training. Eating and drinking  Follow a healthy meal plan. This includes eating: ? Lean proteins. ? Complex carbohydrates. Examples of these include whole grains, starchy vegetables (potatoes, corn, peas), and beans. ? Fresh fruits and vegetables. ? Low-fat dairy products. ? Healthy fats.  Follow instructions from your health care provider about eating or drinking restrictions.  Make an appointment to see a diet and nutrition specialist (registered dietitian) to help you create a healthy eating plan.   General instructions  Check your blood glucose levels as told by your health care provider.  Take over-the-counter and prescription medicines only as told by your health care provider.  Lose weight as told by your health care provider. ? Losing 5-7% of your body weight can reverse insulin resistance. ? Your health care provider can determine how much weight loss is best for you and can help you lose weight safely.  Do not use any products  that contain nicotine or tobacco. These products include cigarettes, chewing tobacco, and vaping devices, such as e-cigarettes. If you need help quitting, ask your health care provider.  Keep all follow-up visits.  This is important. Contact a health care provider if:  You have trouble losing weight or maintaining your goal weight.  You gain weight.  You have trouble following your prescribed meal plan.  You have trouble exercising more. Summary  Insulin resistance occurs when cells in the body do not respond properly to insulin and are not able to absorb blood sugar (glucose). The body makes more insulin, but over time the body cannot make enough insulin to keep blood sugar at normal levels.  Insulin resistance is treated with nutrition and lifestyle changes, including eating a healthy balance of nutritious foods, getting more physical activity, and maintaining a healthy weight.  Your health care provider will work with you to change your nutrition and lifestyle as needed. Treatment may also include medicine to improve your insulin sensitivity.  Check your blood glucose levels as told by your health care provider.  Keep all follow-up visits. This is important. This information is not intended to replace advice given to you by your health care provider. Make sure you discuss any questions you have with your health care provider. Document Revised: 05/02/2020 Document Reviewed: 05/02/2020 Elsevier Patient Education  2021 [employer].

## 2020-11-09 DIAGNOSIS — Z713 Dietary counseling and surveillance: Secondary | ICD-10-CM | POA: Diagnosis not present

## 2020-11-09 DIAGNOSIS — F330 Major depressive disorder, recurrent, mild: Secondary | ICD-10-CM | POA: Diagnosis not present

## 2020-11-10 ENCOUNTER — Encounter: Payer: Self-pay | Admitting: Internal Medicine

## 2020-11-14 DIAGNOSIS — M542 Cervicalgia: Secondary | ICD-10-CM | POA: Diagnosis not present

## 2020-11-14 DIAGNOSIS — M5412 Radiculopathy, cervical region: Secondary | ICD-10-CM | POA: Diagnosis not present

## 2020-11-14 DIAGNOSIS — M9903 Segmental and somatic dysfunction of lumbar region: Secondary | ICD-10-CM | POA: Diagnosis not present

## 2020-11-14 DIAGNOSIS — M25532 Pain in left wrist: Secondary | ICD-10-CM | POA: Diagnosis not present

## 2020-11-14 DIAGNOSIS — M9901 Segmental and somatic dysfunction of cervical region: Secondary | ICD-10-CM | POA: Diagnosis not present

## 2020-11-14 DIAGNOSIS — M9902 Segmental and somatic dysfunction of thoracic region: Secondary | ICD-10-CM | POA: Diagnosis not present

## 2020-11-14 DIAGNOSIS — M546 Pain in thoracic spine: Secondary | ICD-10-CM | POA: Diagnosis not present

## 2020-11-18 DIAGNOSIS — M9901 Segmental and somatic dysfunction of cervical region: Secondary | ICD-10-CM | POA: Diagnosis not present

## 2020-11-18 DIAGNOSIS — M5412 Radiculopathy, cervical region: Secondary | ICD-10-CM | POA: Diagnosis not present

## 2020-11-18 DIAGNOSIS — M9903 Segmental and somatic dysfunction of lumbar region: Secondary | ICD-10-CM | POA: Diagnosis not present

## 2020-11-18 DIAGNOSIS — M9902 Segmental and somatic dysfunction of thoracic region: Secondary | ICD-10-CM | POA: Diagnosis not present

## 2020-11-25 DIAGNOSIS — M5412 Radiculopathy, cervical region: Secondary | ICD-10-CM | POA: Diagnosis not present

## 2020-11-25 DIAGNOSIS — M9902 Segmental and somatic dysfunction of thoracic region: Secondary | ICD-10-CM | POA: Diagnosis not present

## 2020-11-25 DIAGNOSIS — M9903 Segmental and somatic dysfunction of lumbar region: Secondary | ICD-10-CM | POA: Diagnosis not present

## 2020-11-25 DIAGNOSIS — M9901 Segmental and somatic dysfunction of cervical region: Secondary | ICD-10-CM | POA: Diagnosis not present

## 2020-11-28 DIAGNOSIS — M9901 Segmental and somatic dysfunction of cervical region: Secondary | ICD-10-CM | POA: Diagnosis not present

## 2020-11-28 DIAGNOSIS — M5412 Radiculopathy, cervical region: Secondary | ICD-10-CM | POA: Diagnosis not present

## 2020-11-28 DIAGNOSIS — M9903 Segmental and somatic dysfunction of lumbar region: Secondary | ICD-10-CM | POA: Diagnosis not present

## 2020-11-28 DIAGNOSIS — M9902 Segmental and somatic dysfunction of thoracic region: Secondary | ICD-10-CM | POA: Diagnosis not present

## 2020-11-30 DIAGNOSIS — R0789 Other chest pain: Secondary | ICD-10-CM | POA: Diagnosis not present

## 2020-11-30 DIAGNOSIS — R1012 Left upper quadrant pain: Secondary | ICD-10-CM | POA: Diagnosis not present

## 2020-12-02 DIAGNOSIS — M9902 Segmental and somatic dysfunction of thoracic region: Secondary | ICD-10-CM | POA: Diagnosis not present

## 2020-12-02 DIAGNOSIS — M9901 Segmental and somatic dysfunction of cervical region: Secondary | ICD-10-CM | POA: Diagnosis not present

## 2020-12-02 DIAGNOSIS — M9903 Segmental and somatic dysfunction of lumbar region: Secondary | ICD-10-CM | POA: Diagnosis not present

## 2020-12-02 DIAGNOSIS — M5412 Radiculopathy, cervical region: Secondary | ICD-10-CM | POA: Diagnosis not present

## 2020-12-06 DIAGNOSIS — M9902 Segmental and somatic dysfunction of thoracic region: Secondary | ICD-10-CM | POA: Diagnosis not present

## 2020-12-06 DIAGNOSIS — M5412 Radiculopathy, cervical region: Secondary | ICD-10-CM | POA: Diagnosis not present

## 2020-12-06 DIAGNOSIS — M9901 Segmental and somatic dysfunction of cervical region: Secondary | ICD-10-CM | POA: Diagnosis not present

## 2020-12-06 DIAGNOSIS — M9903 Segmental and somatic dysfunction of lumbar region: Secondary | ICD-10-CM | POA: Diagnosis not present

## 2020-12-07 DIAGNOSIS — F330 Major depressive disorder, recurrent, mild: Secondary | ICD-10-CM | POA: Diagnosis not present

## 2020-12-09 DIAGNOSIS — S060X1A Concussion with loss of consciousness of 30 minutes or less, initial encounter: Secondary | ICD-10-CM | POA: Diagnosis not present

## 2020-12-09 DIAGNOSIS — R55 Syncope and collapse: Secondary | ICD-10-CM | POA: Diagnosis not present

## 2020-12-09 DIAGNOSIS — R519 Headache, unspecified: Secondary | ICD-10-CM | POA: Diagnosis not present

## 2020-12-09 DIAGNOSIS — R2990 Unspecified symptoms and signs involving the nervous system: Secondary | ICD-10-CM | POA: Diagnosis not present

## 2020-12-09 DIAGNOSIS — E785 Hyperlipidemia, unspecified: Secondary | ICD-10-CM | POA: Diagnosis not present

## 2020-12-09 DIAGNOSIS — R112 Nausea with vomiting, unspecified: Secondary | ICD-10-CM | POA: Diagnosis not present

## 2020-12-09 DIAGNOSIS — R531 Weakness: Secondary | ICD-10-CM | POA: Diagnosis not present

## 2020-12-09 DIAGNOSIS — R278 Other lack of coordination: Secondary | ICD-10-CM | POA: Diagnosis not present

## 2020-12-09 DIAGNOSIS — W1839XA Other fall on same level, initial encounter: Secondary | ICD-10-CM | POA: Diagnosis not present

## 2020-12-09 DIAGNOSIS — S0990XA Unspecified injury of head, initial encounter: Secondary | ICD-10-CM | POA: Diagnosis not present

## 2020-12-09 DIAGNOSIS — R42 Dizziness and giddiness: Secondary | ICD-10-CM | POA: Diagnosis not present

## 2020-12-09 DIAGNOSIS — R4789 Other speech disturbances: Secondary | ICD-10-CM | POA: Diagnosis not present

## 2020-12-09 DIAGNOSIS — M542 Cervicalgia: Secondary | ICD-10-CM | POA: Diagnosis not present

## 2020-12-09 DIAGNOSIS — Z79899 Other long term (current) drug therapy: Secondary | ICD-10-CM | POA: Diagnosis not present

## 2020-12-09 DIAGNOSIS — M7989 Other specified soft tissue disorders: Secondary | ICD-10-CM | POA: Diagnosis not present

## 2020-12-09 DIAGNOSIS — F0781 Postconcussional syndrome: Secondary | ICD-10-CM | POA: Diagnosis not present

## 2020-12-09 DIAGNOSIS — G4489 Other headache syndrome: Secondary | ICD-10-CM | POA: Diagnosis not present

## 2020-12-09 DIAGNOSIS — R6884 Jaw pain: Secondary | ICD-10-CM | POA: Diagnosis not present

## 2020-12-09 DIAGNOSIS — S062X9A Diffuse traumatic brain injury with loss of consciousness of unspecified duration, initial encounter: Secondary | ICD-10-CM | POA: Diagnosis not present

## 2020-12-09 DIAGNOSIS — S199XXA Unspecified injury of neck, initial encounter: Secondary | ICD-10-CM | POA: Diagnosis not present

## 2020-12-09 DIAGNOSIS — W19XXXA Unspecified fall, initial encounter: Secondary | ICD-10-CM | POA: Diagnosis not present

## 2020-12-09 DIAGNOSIS — Z8673 Personal history of transient ischemic attack (TIA), and cerebral infarction without residual deficits: Secondary | ICD-10-CM | POA: Diagnosis not present

## 2020-12-10 DIAGNOSIS — R2990 Unspecified symptoms and signs involving the nervous system: Secondary | ICD-10-CM | POA: Diagnosis not present

## 2020-12-10 DIAGNOSIS — R55 Syncope and collapse: Secondary | ICD-10-CM | POA: Diagnosis not present

## 2020-12-10 DIAGNOSIS — I517 Cardiomegaly: Secondary | ICD-10-CM | POA: Diagnosis not present

## 2020-12-10 DIAGNOSIS — S0990XA Unspecified injury of head, initial encounter: Secondary | ICD-10-CM | POA: Diagnosis not present

## 2020-12-11 DIAGNOSIS — R001 Bradycardia, unspecified: Secondary | ICD-10-CM | POA: Diagnosis not present

## 2020-12-11 DIAGNOSIS — I639 Cerebral infarction, unspecified: Secondary | ICD-10-CM | POA: Diagnosis not present

## 2020-12-11 DIAGNOSIS — F0781 Postconcussional syndrome: Secondary | ICD-10-CM | POA: Diagnosis not present

## 2020-12-12 DIAGNOSIS — R55 Syncope and collapse: Secondary | ICD-10-CM | POA: Diagnosis not present

## 2020-12-12 DIAGNOSIS — F0781 Postconcussional syndrome: Secondary | ICD-10-CM | POA: Diagnosis not present

## 2020-12-12 DIAGNOSIS — M542 Cervicalgia: Secondary | ICD-10-CM | POA: Diagnosis not present

## 2020-12-12 DIAGNOSIS — R519 Headache, unspecified: Secondary | ICD-10-CM | POA: Diagnosis not present

## 2020-12-14 ENCOUNTER — Other Ambulatory Visit: Payer: Self-pay | Admitting: Internal Medicine

## 2020-12-14 DIAGNOSIS — Z1231 Encounter for screening mammogram for malignant neoplasm of breast: Secondary | ICD-10-CM

## 2020-12-19 DIAGNOSIS — R030 Elevated blood-pressure reading, without diagnosis of hypertension: Secondary | ICD-10-CM | POA: Diagnosis not present

## 2020-12-19 DIAGNOSIS — M542 Cervicalgia: Secondary | ICD-10-CM | POA: Diagnosis not present

## 2020-12-26 DIAGNOSIS — R1012 Left upper quadrant pain: Secondary | ICD-10-CM | POA: Diagnosis not present

## 2020-12-26 DIAGNOSIS — K2900 Acute gastritis without bleeding: Secondary | ICD-10-CM | POA: Diagnosis not present

## 2020-12-26 DIAGNOSIS — K219 Gastro-esophageal reflux disease without esophagitis: Secondary | ICD-10-CM | POA: Diagnosis not present

## 2020-12-26 DIAGNOSIS — K2930 Chronic superficial gastritis without bleeding: Secondary | ICD-10-CM | POA: Diagnosis not present

## 2020-12-27 ENCOUNTER — Encounter: Payer: Self-pay | Admitting: Internal Medicine

## 2021-01-11 DIAGNOSIS — F330 Major depressive disorder, recurrent, mild: Secondary | ICD-10-CM | POA: Diagnosis not present

## 2021-02-01 ENCOUNTER — Other Ambulatory Visit: Payer: Self-pay

## 2021-02-01 ENCOUNTER — Ambulatory Visit
Admission: RE | Admit: 2021-02-01 | Discharge: 2021-02-01 | Disposition: A | Discharge status: Home / Self Care | Payer: Federal, State, Local not specified - PPO | Source: Ambulatory Visit | Attending: Internal Medicine | Admitting: Internal Medicine

## 2021-02-01 DIAGNOSIS — Z1231 Encounter for screening mammogram for malignant neoplasm of breast: Secondary | ICD-10-CM | POA: Diagnosis not present

## 2021-02-03 ENCOUNTER — Other Ambulatory Visit: Payer: Self-pay | Admitting: Internal Medicine

## 2021-02-03 DIAGNOSIS — R928 Other abnormal and inconclusive findings on diagnostic imaging of breast: Secondary | ICD-10-CM

## 2021-02-18 ENCOUNTER — Other Ambulatory Visit: Payer: Self-pay | Admitting: Internal Medicine

## 2021-03-01 ENCOUNTER — Ambulatory Visit
Admission: RE | Admit: 2021-03-01 | Discharge: 2021-03-01 | Disposition: A | Discharge status: Home / Self Care | Payer: Federal, State, Local not specified - PPO | Source: Ambulatory Visit | Attending: Internal Medicine | Admitting: Internal Medicine

## 2021-03-01 ENCOUNTER — Ambulatory Visit: Payer: Federal, State, Local not specified - PPO

## 2021-03-01 ENCOUNTER — Other Ambulatory Visit: Payer: Self-pay

## 2021-03-01 DIAGNOSIS — R928 Other abnormal and inconclusive findings on diagnostic imaging of breast: Secondary | ICD-10-CM | POA: Diagnosis not present

## 2021-03-01 DIAGNOSIS — R922 Inconclusive mammogram: Secondary | ICD-10-CM | POA: Diagnosis not present

## 2021-03-08 DIAGNOSIS — F330 Major depressive disorder, recurrent, mild: Secondary | ICD-10-CM | POA: Diagnosis not present

## 2021-03-15 DIAGNOSIS — H2511 Age-related nuclear cataract, right eye: Secondary | ICD-10-CM | POA: Diagnosis not present

## 2021-03-15 DIAGNOSIS — H31093 Other chorioretinal scars, bilateral: Secondary | ICD-10-CM | POA: Diagnosis not present

## 2021-03-15 DIAGNOSIS — H16223 Keratoconjunctivitis sicca, not specified as Sjogren's, bilateral: Secondary | ICD-10-CM | POA: Diagnosis not present

## 2021-03-15 DIAGNOSIS — H04123 Dry eye syndrome of bilateral lacrimal glands: Secondary | ICD-10-CM | POA: Diagnosis not present

## 2021-03-15 DIAGNOSIS — Z713 Dietary counseling and surveillance: Secondary | ICD-10-CM | POA: Diagnosis not present

## 2021-03-15 DIAGNOSIS — H3561 Retinal hemorrhage, right eye: Secondary | ICD-10-CM | POA: Diagnosis not present

## 2021-03-27 ENCOUNTER — Other Ambulatory Visit: Payer: Self-pay

## 2021-03-27 ENCOUNTER — Encounter: Payer: Self-pay | Admitting: Internal Medicine

## 2021-03-27 ENCOUNTER — Other Ambulatory Visit: Payer: Federal, State, Local not specified - PPO

## 2021-03-27 DIAGNOSIS — R7309 Other abnormal glucose: Secondary | ICD-10-CM | POA: Diagnosis not present

## 2021-03-27 DIAGNOSIS — Z0000 Encounter for general adult medical examination without abnormal findings: Secondary | ICD-10-CM

## 2021-03-27 DIAGNOSIS — E7800 Pure hypercholesterolemia, unspecified: Secondary | ICD-10-CM

## 2021-03-27 DIAGNOSIS — E559 Vitamin D deficiency, unspecified: Secondary | ICD-10-CM

## 2021-03-27 LAB — HEMOGLOBIN A1C
Est. average glucose Bld gHb Est-mCnc: 123 mg/dL
Hgb A1c MFr Bld: 5.9 % — ABNORMAL HIGH (ref 4.8–5.6)

## 2021-03-31 ENCOUNTER — Encounter: Payer: Self-pay | Admitting: Internal Medicine

## 2021-04-03 ENCOUNTER — Encounter: Payer: Self-pay | Admitting: Internal Medicine

## 2021-04-03 ENCOUNTER — Other Ambulatory Visit: Payer: Self-pay

## 2021-04-03 ENCOUNTER — Ambulatory Visit: Payer: Federal, State, Local not specified - PPO | Admitting: Internal Medicine

## 2021-04-03 VITALS — BP 110/60 | HR 76 | Temp 98.2°F | Ht 67.8 in | Wt 161.8 lb

## 2021-04-03 DIAGNOSIS — E7800 Pure hypercholesterolemia, unspecified: Secondary | ICD-10-CM

## 2021-04-03 DIAGNOSIS — E559 Vitamin D deficiency, unspecified: Secondary | ICD-10-CM

## 2021-04-03 DIAGNOSIS — R7309 Other abnormal glucose: Secondary | ICD-10-CM

## 2021-04-03 DIAGNOSIS — Z6824 Body mass index (BMI) 24.0-24.9, adult: Secondary | ICD-10-CM | POA: Diagnosis not present

## 2021-04-03 NOTE — Patient Instructions (Addendum)
The 10-year ASCVD risk score Denman George DC Montez Hageman., et al., 2013) is: 1.4%   Values used to calculate the score:     Age: 55 years     Sex: Female     Is Non-Hispanic African American: Yes     Diabetic: No     Tobacco smoker: No     Systolic Blood Pressure: 110 mmHg     Is BP treated: No     HDL Cholesterol: 59 MG/DL     Total Cholesterol: 138 MG/DL   High Cholesterol  High cholesterol is a condition in which the blood has high levels of a white, waxy substance similar to fat (cholesterol). The liver makes all the cholesterol that the body needs. The human body needs small amounts of cholesterol to help build cells. A person gets extra orexcess cholesterol from the food that he or she eats. The blood carries cholesterol from the liver to the rest of the body. If you have high cholesterol, deposits (plaques) may build up on the walls of your arteries. Arteries are the blood vessels that carry blood away from your heart. These plaques make the arteries narrowand stiff. Cholesterol plaques increase your risk for heart attack and stroke. Work withyour health care provider to keep your cholesterol levels in a healthy range. What increases the risk? The following factors may make you more likely to develop this condition: Eating foods that are high in animal fat (saturated fat) or cholesterol. Being overweight. Not getting enough exercise. A family history of high cholesterol (familial hypercholesterolemia). Use of tobacco products. Having diabetes. What are the signs or symptoms? There are no symptoms of this condition. How is this diagnosed? This condition may be diagnosed based on the results of a blood test. If you are older than 56 years of age, your health care provider may check your cholesterol levels every 4-6 years. You may be checked more often if you have high cholesterol or other risk factors for heart disease. The blood test for cholesterol measures: "Bad" cholesterol, or LDL  cholesterol. This is the main type of cholesterol that causes heart disease. The desired level is less than 100 mg/dL. "Good" cholesterol, or HDL cholesterol. HDL helps protect against heart disease by cleaning the arteries and carrying the LDL to the liver for processing. The desired level for HDL is 60 mg/dL or higher. Triglycerides. These are fats that your body can store or burn for energy. The desired level is less than 150 mg/dL. Total cholesterol. This measures the total amount of cholesterol in your blood and includes LDL, HDL, and triglycerides. The desired level is less than 200 mg/dL. How is this treated? This condition may be treated with: Diet changes. You may be asked to eat foods that have more fiber and less saturated fats or added sugar. Lifestyle changes. These may include regular exercise, maintaining a healthy weight, and quitting use of tobacco products. Medicines. These are given when diet and lifestyle changes have not worked. You may be prescribed a statin medicine to help lower your cholesterol levels. Follow these instructions at home: Eating and drinking  Eat a healthy, balanced diet. This diet includes: Daily servings of a variety of fresh, frozen, or canned fruits and vegetables. Daily servings of whole grain foods that are rich in fiber. Foods that are low in saturated fats and trans fats. These include poultry and fish without skin, lean cuts of meat, and low-fat dairy products. A variety of fish, especially oily fish that contain omega-3  fatty acids. Aim to eat fish at least 2 times a week. Avoid foods and drinks that have added sugar. Use healthy cooking methods, such as roasting, grilling, broiling, baking, poaching, steaming, and stir-frying. Do not fry your food except for stir-frying.  Lifestyle  Get regular exercise. Aim to exercise for a total of 150 minutes a week. Increase your activity level by doing activities such as gardening, walking, and taking the  stairs. Do not use any products that contain nicotine or tobacco, such as cigarettes, e-cigarettes, and chewing tobacco. If you need help quitting, ask your health care provider.  General instructions Take over-the-counter and prescription medicines only as told by your health care provider. Keep all follow-up visits as told by your health care provider. This is important. Where to find more information American Heart Association: www.heart.org National Heart, Lung, and Blood Institute: [employer].is Contact a health care provider if: You have trouble achieving or maintaining a healthy diet or weight. You are starting an exercise program. You are unable to stop smoking. Get help right away if: You have chest pain. You have trouble breathing. You have any symptoms of a stroke. "BE FAST" is an easy way to remember the main warning signs of a stroke: B - Balance. Signs are dizziness, sudden trouble walking, or loss of balance. E - Eyes. Signs are trouble seeing or a sudden change in vision. F - Face. Signs are sudden weakness or numbness of the face, or the face or eyelid drooping on one side. A - Arms. Signs are weakness or numbness in an arm. This happens suddenly and usually on one side of the body. S - Speech. Signs are sudden trouble speaking, slurred speech, or trouble understanding what people say. T - Time. Time to call emergency services. Write down what time symptoms started. You have other signs of a stroke, such as: A sudden, severe headache with no known cause. Nausea or vomiting. Seizure. These symptoms may represent a serious problem that is an emergency. Do not wait to see if the symptoms will go away. Get medical help right away. Call your local emergency services (911 in the [place] Do not drive yourself to the hospital. Summary Cholesterol plaques increase your risk for heart attack and stroke. Work with your health care provider to keep your cholesterol levels in a  healthy range. Eat a healthy, balanced diet, get regular exercise, and maintain a healthy weight. Do not use any products that contain nicotine or tobacco, such as cigarettes, e-cigarettes, and chewing tobacco. Get help right away if you have any symptoms of a stroke. This information is not intended to replace advice given to you by your health care provider. Make sure you discuss any questions you have with your healthcare provider. Document Revised: 07/13/2019 Document Reviewed: 07/13/2019 Elsevier Patient Education  2022 [employer].

## 2021-04-03 NOTE — Progress Notes (Signed)
I,Yamilka Roman [employer] as a Neurosurgeon for [name], MD.,have documented all relevant documentation on the behalf of [name], MD,as directed by  [name], MD while in the presence of [name], MD.  This visit occurred during the SARS-CoV-2 public health emergency.  Safety protocols were in place, including screening questions prior to the visit, additional usage of staff PPE, and extensive cleaning of exam room while observing appropriate contact time as indicated for disinfecting solutions.  Subjective:     Patient ID: [name] , female    DOB: 06-12-1966 , 55 y.o.   MRN: [id number]   Chief Complaint  Patient presents with   Prediabetes   Hyperlipidemia    HPI  The patient is here today for a f/u of prediabetes. She had her labs drawn prior to her visit. A1c was 5.9. this has improved since March 2022. She reports a clean diet. She has not been able to exercise as she usually does due to postoperative healing from foot surgery.   Additionally,she has been taking rosuvastatin for hyperlipidemia.     Past Medical History:  Diagnosis Date   Allergy-induced asthma    no current med.   Complication of anesthesia    states is hard to get to sleep and hard to wake up   History of CVA (cerebrovascular accident) 2004   states no deficits   Innocent heart murmur    per pt.   Medial meniscus tear 08/2016   left knee   Seasonal allergies      Family History  Problem Relation Age of Onset   Hypertension Mother    Diabetes Mother    Hyperlipidemia Mother    Heart disease Mother    Pancreatic cancer Father    Diabetes Sister    Hypertension Sister    Hyperlipidemia Sister      Current Outpatient Medications:    Calcium-Vitamin D-Vitamin K [id number] MG-UNT-MCG TABS, Take 1 tablet by mouth daily., Disp: , Rfl:    cetirizine (ZYRTEC) 10 MG tablet, Take 10 mg by mouth daily., Disp: , Rfl:    doxycycline (VIBRAMYCIN) 100 MG capsule, Take  100 mg by mouth daily at 2 PM., Disp: , Rfl:    fluticasone (FLONASE) 50 MCG/ACT nasal spray, Place 1 spray into both nostrils in the morning and at bedtime., Disp: , Rfl:    ketoconazole (NIZORAL) 2 % cream, Apply 1 application topically 3 (three) times a week., Disp: , Rfl:    Multiple Vitamins-Minerals (MULTIVITAMIN WOMEN 50+ PO), Take 1 tablet by mouth daily., Disp: , Rfl:    NONFORMULARY OR COMPOUNDED ITEM, Take 5 mLs by mouth at bedtime. Previden rinse 0.2% sodium fluoride, Disp: , Rfl:    rosuvastatin (CRESTOR) 10 MG tablet, TAKE 1 TABLET BY MOUTH EVERYDAY AT BEDTIME, Disp: 90 tablet, Rfl: 2   XIIDRA 5 % SOLN, Apply 1 drop to eye 2 (two) times daily. , Disp: , Rfl: 3   Allergies  Allergen Reactions   Other     Other reaction(s): Respiratory Distress Other reaction(s): Respiratory Distress Other reaction(s): Eye Redness   Tomato Anaphylaxis     Review of Systems  Constitutional: Negative.   HENT:         States she has hemorrhage in her left eye.   Eyes: Negative.   Respiratory: Negative.    Cardiovascular: Negative.   Gastrointestinal: Negative.   Endocrine: Negative for polydipsia, polyphagia and polyuria.  Musculoskeletal: Negative.   Skin: Negative.  Neurological: Negative.   Psychiatric/Behavioral: Negative.      Today's Vitals   04/03/21 1456  BP: 110/60  Pulse: 76  Temp: 98.2 F (36.8 C)  Weight: 161 lb 12.8 oz (73.4 kg)  Height: 5' 7.8" (1.722 m)  PainSc: 0-No pain   Body mass index is 24.75 kg/m.  Wt Readings from Last 3 Encounters:  04/03/21 161 lb 12.8 oz (73.4 kg)  11/08/20 156 lb 9.6 oz (71 kg)  11/03/20 154 lb 9.6 oz (70.1 kg)     Objective:  Physical Exam Vitals and nursing note reviewed.  Constitutional:      Appearance: Normal appearance.  HENT:     Head: Normocephalic and atraumatic.     Nose:     Comments: Masked     Mouth/Throat:     Comments: Masked  Eyes:     Extraocular Movements: Extraocular movements intact.   Cardiovascular:     Rate and Rhythm: Normal rate and regular rhythm.     Heart sounds: Normal heart sounds.  Pulmonary:     Effort: Pulmonary effort is normal.     Breath sounds: Normal breath sounds.  Musculoskeletal:     Cervical back: Normal range of motion.  Skin:    General: Skin is warm.  Neurological:     General: No focal deficit present.     Mental Status: She is alert.  Psychiatric:        Mood and Affect: Mood normal.        Behavior: Behavior normal.        Assessment And Plan:     1. Abnormal glucose Comments: Pt advised of recent a1c results. She is encouraged to limit her intake of refined carbs and to aim for 150 minutes of exercise per week.   2. Pure hypercholesterolemia Comments: She will c/w rosuvastatin 10mg  daily. Encouraged to live a heart healthy diet.   3. Vitamin D deficiency disease Comments: I will check a vitamin D level and supplement as needed.  - Vitamin D (25 hydroxy)  4. BMI 24.0-24.9, adult Comments: Her BMI is in an acceptable range.     Patient was given opportunity to ask questions. Patient verbalized understanding of the plan and was able to repeat key elements of the plan. All questions were answered to their satisfaction.   I, 06-13-2001, MD, have reviewed all documentation for this visit. The documentation on 04/03/21 for the exam, diagnosis, procedures, and orders are all accurate and complete.   IF YOU HAVE BEEN REFERRED TO A SPECIALIST, IT MAY TAKE 1-2 WEEKS TO SCHEDULE/PROCESS THE REFERRAL. IF YOU HAVE NOT HEARD FROM US/SPECIALIST IN TWO WEEKS, PLEASE GIVE 06/03/21 A CALL AT [phone].   THE PATIENT IS ENCOURAGED TO PRACTICE SOCIAL DISTANCING DUE TO THE COVID-19 PANDEMIC.

## 2021-04-04 LAB — VITAMIN D 25 HYDROXY (VIT D DEFICIENCY, FRACTURES): Vit D, 25-Hydroxy: 39.4 ng/mL (ref 30.0–100.0)

## 2021-04-10 ENCOUNTER — Ambulatory Visit: Payer: Federal, State, Local not specified - PPO | Admitting: Internal Medicine

## 2021-04-13 ENCOUNTER — Encounter: Payer: Self-pay | Admitting: Internal Medicine

## 2021-05-10 DIAGNOSIS — F330 Major depressive disorder, recurrent, mild: Secondary | ICD-10-CM | POA: Diagnosis not present

## 2021-05-24 DIAGNOSIS — H40021 Open angle with borderline findings, high risk, right eye: Secondary | ICD-10-CM | POA: Diagnosis not present

## 2021-06-07 DIAGNOSIS — F330 Major depressive disorder, recurrent, mild: Secondary | ICD-10-CM | POA: Diagnosis not present

## 2021-06-12 DIAGNOSIS — L669 Cicatricial alopecia, unspecified: Secondary | ICD-10-CM | POA: Diagnosis not present

## 2021-07-12 DIAGNOSIS — F330 Major depressive disorder, recurrent, mild: Secondary | ICD-10-CM | POA: Diagnosis not present

## 2021-07-18 DIAGNOSIS — F330 Major depressive disorder, recurrent, mild: Secondary | ICD-10-CM | POA: Diagnosis not present

## 2021-07-31 ENCOUNTER — Other Ambulatory Visit: Payer: Federal, State, Local not specified - PPO

## 2021-07-31 ENCOUNTER — Other Ambulatory Visit: Payer: Self-pay

## 2021-07-31 DIAGNOSIS — Z0000 Encounter for general adult medical examination without abnormal findings: Secondary | ICD-10-CM | POA: Diagnosis not present

## 2021-07-31 DIAGNOSIS — R7309 Other abnormal glucose: Secondary | ICD-10-CM

## 2021-08-01 LAB — CMP14+EGFR
ALT: 19 IU/L (ref 0–32)
AST: 19 IU/L (ref 0–40)
Albumin/Globulin Ratio: 1.6 (ref 1.2–2.2)
Albumin: 4.7 g/dL (ref 3.8–4.9)
Alkaline Phosphatase: 84 IU/L (ref 44–121)
BUN/Creatinine Ratio: 18 (ref 9–23)
BUN: 15 mg/dL (ref 6–24)
Bilirubin Total: 0.3 mg/dL (ref 0.0–1.2)
CO2: 23 mmol/L (ref 20–29)
Calcium: 9.5 mg/dL (ref 8.7–10.2)
Chloride: 102 mmol/L (ref 96–106)
Creatinine, Ser: 0.82 mg/dL (ref 0.57–1.00)
Globulin, Total: 3 g/dL (ref 1.5–4.5)
Glucose: 78 mg/dL (ref 70–99)
Potassium: 4 mmol/L (ref 3.5–5.2)
Sodium: 139 mmol/L (ref 134–144)
Total Protein: 7.7 g/dL (ref 6.0–8.5)
eGFR: 84 mL/min/{1.73_m2} (ref >59)

## 2021-08-01 LAB — CBC
Hematocrit: 36 % (ref 34.0–46.6)
Hemoglobin: 12 g/dL (ref 11.1–15.9)
MCH: 28.9 pg (ref 26.6–33.0)
MCHC: 33.3 g/dL (ref 31.5–35.7)
MCV: 87 fL (ref 79–97)
Platelets: 327 10*3/uL (ref 150–450)
RBC: 4.15 x10E6/uL (ref 3.77–5.28)
RDW: 13.9 % (ref 11.7–15.4)
WBC: 5.5 10*3/uL (ref 3.4–10.8)

## 2021-08-01 LAB — HEMOGLOBIN A1C
Est. average glucose Bld gHb Est-mCnc: 126 mg/dL
Hgb A1c MFr Bld: 6 % — ABNORMAL HIGH (ref 4.8–5.6)

## 2021-08-02 DIAGNOSIS — F330 Major depressive disorder, recurrent, mild: Secondary | ICD-10-CM | POA: Diagnosis not present

## 2021-08-07 ENCOUNTER — Ambulatory Visit (INDEPENDENT_AMBULATORY_CARE_PROVIDER_SITE_OTHER): Payer: Federal, State, Local not specified - PPO | Admitting: Internal Medicine

## 2021-08-07 ENCOUNTER — Other Ambulatory Visit: Payer: Self-pay

## 2021-08-07 ENCOUNTER — Encounter: Payer: Self-pay | Admitting: Internal Medicine

## 2021-08-07 VITALS — BP 124/78 | HR 76 | Temp 99.2°F | Ht 67.8 in | Wt 170.4 lb

## 2021-08-07 DIAGNOSIS — E559 Vitamin D deficiency, unspecified: Secondary | ICD-10-CM

## 2021-08-07 DIAGNOSIS — Z0001 Encounter for general adult medical examination with abnormal findings: Secondary | ICD-10-CM

## 2021-08-07 DIAGNOSIS — Z0000 Encounter for general adult medical examination without abnormal findings: Secondary | ICD-10-CM

## 2021-08-07 DIAGNOSIS — H579 Unspecified disorder of eye and adnexa: Secondary | ICD-10-CM | POA: Diagnosis not present

## 2021-08-07 DIAGNOSIS — M79605 Pain in left leg: Secondary | ICD-10-CM

## 2021-08-07 DIAGNOSIS — Z6826 Body mass index (BMI) 26.0-26.9, adult: Secondary | ICD-10-CM

## 2021-08-07 DIAGNOSIS — Z20822 Contact with and (suspected) exposure to covid-19: Secondary | ICD-10-CM | POA: Diagnosis not present

## 2021-08-07 NOTE — Patient Instructions (Addendum)
Arnica gel for bruising Vitamin C 1000mg  daily  Health Maintenance, Female Adopting a healthy lifestyle and getting preventive care are important in promoting health and wellness. Ask your health care provider about: The right schedule for you to have regular tests and exams. Things you can do on your own to prevent diseases and keep yourself healthy. What should I know about diet, weight, and exercise? Eat a healthy diet  Eat a diet that includes plenty of vegetables, fruits, low-fat dairy products, and lean protein. Do not eat a lot of foods that are high in solid fats, added sugars, or sodium. Maintain a healthy weight Body mass index (BMI) is used to identify weight problems. It estimates body fat based on height and weight. Your health care provider can help determine your BMI and help you achieve or maintain a healthy weight. Get regular exercise Get regular exercise. This is one of the most important things you can do for your health. Most adults should: Exercise for at least 150 minutes each week. The exercise should increase your heart rate and make you sweat (moderate-intensity exercise). Do strengthening exercises at least twice a week. This is in addition to the moderate-intensity exercise. Spend less time sitting. Even light physical activity can be beneficial. Watch cholesterol and blood lipids Have your blood tested for lipids and cholesterol at 56 years of age, then have this test every 5 years. Have your cholesterol levels checked more often if: Your lipid or cholesterol levels are high. You are older than 56 years of age. You are at high risk for heart disease. What should I know about cancer screening? Depending on your health history and family history, you may need to have cancer screening at various ages. This may include screening for: Breast cancer. Cervical cancer. Colorectal cancer. Skin cancer. Lung cancer. What should I know about heart disease, diabetes,  and high blood pressure? Blood pressure and heart disease High blood pressure causes heart disease and increases the risk of stroke. This is more likely to develop in people who have high blood pressure readings or are overweight. Have your blood pressure checked: Every 3-5 years if you are 31-11 years of age. Every year if you are 56 years old or older. Diabetes Have regular diabetes screenings. This checks your fasting blood sugar level. Have the screening done: Once every three years after age 56 if you are at a normal weight and have a low risk for diabetes. More often and at a younger age if you are overweight or have a high risk for diabetes. What should I know about preventing infection? Hepatitis B If you have a higher risk for hepatitis B, you should be screened for this virus. Talk with your health care provider to find out if you are at risk for hepatitis B infection. Hepatitis C Testing is recommended for: Everyone born from [birth year] through 1966. Anyone with known risk factors for hepatitis C. Sexually transmitted infections (STIs) Get screened for STIs, including gonorrhea and chlamydia, if: You are sexually active and are younger than 56 years of age. You are older than 56 years of age and your health care provider tells you that you are at risk for this type of infection. Your sexual activity has changed since you were last screened, and you are at increased risk for chlamydia or gonorrhea. Ask your health care provider if you are at risk. Ask your health care provider about whether you are at high risk for HIV. Your health care provider  may recommend a prescription medicine to help prevent HIV infection. If you choose to take medicine to prevent HIV, you should first get tested for HIV. You should then be tested every 3 months for as long as you are taking the medicine. Pregnancy If you are about to stop having your period (premenopausal) and you may become pregnant, seek  counseling before you get pregnant. Take 400 to 800 micrograms (mcg) of folic acid every day if you become pregnant. Ask for birth control (contraception) if you want to prevent pregnancy. Osteoporosis and menopause Osteoporosis is a disease in which the bones lose minerals and strength with aging. This can result in bone fractures. If you are 56 years old or older, or if you are at risk for osteoporosis and fractures, ask your health care provider if you should: Be screened for bone loss. Take a calcium or vitamin D supplement to lower your risk of fractures. Be given hormone replacement therapy (HRT) to treat symptoms of menopause. Follow these instructions at home: Alcohol use Do not drink alcohol if: Your health care provider tells you not to drink. You are pregnant, may be pregnant, or are planning to become pregnant. If you drink alcohol: Limit how much you have to: 0-1 drink a day. Know how much alcohol is in your drink. In the [place] one drink equals one 12 oz bottle of beer (355 mL), one 5 oz glass of wine (148 mL), or one 1 oz glass of hard liquor (44 mL). Lifestyle Do not use any products that contain nicotine or tobacco. These products include cigarettes, chewing tobacco, and vaping devices, such as e-cigarettes. If you need help quitting, ask your health care provider. Do not use street drugs. Do not share needles. Ask your health care provider for help if you need support or information about quitting drugs. General instructions Schedule regular health, dental, and eye exams. Stay current with your vaccines. Tell your health care provider if: You often feel depressed. You have ever been abused or do not feel safe at home. Summary Adopting a healthy lifestyle and getting preventive care are important in promoting health and wellness. Follow your health care provider's instructions about healthy diet, exercising, and getting tested or screened for diseases. Follow your  health care provider's instructions on monitoring your cholesterol and blood pressure. This information is not intended to replace advice given to you by your health care provider. Make sure you discuss any questions you have with your health care provider. Document Revised: 01/02/2021 Document Reviewed: 01/02/2021 Elsevier Patient Education  2022 [employer].

## 2021-08-07 NOTE — Progress Notes (Signed)
Jeri Cos Llittleton,acting as a Neurosurgeon for [name], MD.,have documented all relevant documentation on the behalf of [name], MD,as directed by  [name], MD while in the presence of [name], MD.  This visit occurred during the SARS-CoV-2 public health emergency.  Safety protocols were in place, including screening questions prior to the visit, additional usage of staff PPE, and extensive cleaning of exam room while observing appropriate contact time as indicated for disinfecting solutions.  Subjective:     Patient ID: [name] , female    DOB: 12/14/1965 , 55 y.o.   MRN: [id number]   Chief Complaint  Patient presents with   Annual Exam    HPI  She is here today for a full physical examination. She is followed by Dr. Normand Sloop for her GYN exams. She is up to date with her pelvic exams. Last mamogram was in June 2022.   Additionally, she reports she has been around someone with Covid.  She states she was around them Thursday night for about 30 minutes.  She stated she is not having any symptoms at this time.    Past Medical History:  Diagnosis Date   Allergy-induced asthma    no current med.   Complication of anesthesia    states is hard to get to sleep and hard to wake up   History of CVA (cerebrovascular accident) 2004   states no deficits   Innocent heart murmur    per pt.   Medial meniscus tear 08/2016   left knee   Seasonal allergies      Family History  Problem Relation Age of Onset   Hypertension Mother    Diabetes Mother    Hyperlipidemia Mother    Heart disease Mother    Pancreatic cancer Father    Diabetes Sister    Hypertension Sister    Hyperlipidemia Sister      Current Outpatient Medications:    Calcium-Vitamin D-Vitamin K [id number] MG-UNT-MCG TABS, Take 1 tablet by mouth daily., Disp: , Rfl:    cetirizine (ZYRTEC) 10 MG tablet, Take 10 mg by mouth daily., Disp: , Rfl:    doxycycline (VIBRAMYCIN) 100 MG  capsule, Take 100 mg by mouth daily at 2 PM., Disp: , Rfl:    fluticasone (FLONASE) 50 MCG/ACT nasal spray, Place 1 spray into both nostrils in the morning and at bedtime., Disp: , Rfl:    ketoconazole (NIZORAL) 2 % cream, Apply 1 application topically 3 (three) times a week., Disp: , Rfl:    NONFORMULARY OR COMPOUNDED ITEM, Take 5 mLs by mouth at bedtime. Previden rinse 0.2% sodium fluoride, Disp: , Rfl:    rosuvastatin (CRESTOR) 10 MG tablet, TAKE 1 TABLET BY MOUTH EVERYDAY AT BEDTIME, Disp: 90 tablet, Rfl: 2   XIIDRA 5 % SOLN, Apply 1 drop to eye 2 (two) times daily. , Disp: , Rfl: 3   Allergies  Allergen Reactions   Other     Other reaction(s): Respiratory Distress Other reaction(s): Respiratory Distress Other reaction(s): Eye Redness   Tomato Anaphylaxis      The patient states she uses status post hysterectomy for birth control. Last LMP was No LMP recorded. Patient has had a hysterectomy.. Negative for Dysmenorrhea. Negative for: breast discharge, breast lump(s), breast pain and breast self exam. Associated symptoms include abnormal vaginal bleeding. Pertinent negatives include abnormal bleeding (hematology), anxiety, decreased libido, depression, difficulty falling sleep, dyspareunia, history of infertility, nocturia, sexual dysfunction, sleep disturbances, urinary incontinence, urinary urgency,  vaginal discharge and vaginal itching. Diet regular.The patient states her exercise level is  moderate.  . The patient's tobacco use is:  Social History   Tobacco Use  Smoking Status Never  Smokeless Tobacco Never  . She has been exposed to passive smoke. The patient's alcohol use is:  Social History   Substance and Sexual Activity  Alcohol Use Yes   Alcohol/week: 0.0 standard drinks   Comment: occasionally   Review of Systems  Constitutional: Negative.   HENT: Negative.    Eyes: Negative.   Respiratory: Negative.    Cardiovascular: Negative.   Gastrointestinal: Negative.    Endocrine: Negative.   Genitourinary: Negative.   Musculoskeletal: Negative.   Skin: Negative.   Allergic/Immunologic: Negative.   Neurological: Negative.   Hematological: Negative.   Psychiatric/Behavioral: Negative.      Today's Vitals   08/07/21 0917  BP: 124/78  Pulse: 76  Temp: 99.2 F (37.3 C)  Weight: 170 lb 6.4 oz (77.3 kg)  Height: 5' 7.8" (1.722 m)  PainSc: 0-No pain   Body mass index is 26.06 kg/m.  Wt Readings from Last 3 Encounters:  08/07/21 170 lb 6.4 oz (77.3 kg)  04/03/21 161 lb 12.8 oz (73.4 kg)  11/08/20 156 lb 9.6 oz (71 kg)     Objective:  Physical Exam Vitals and nursing note reviewed.  Constitutional:      Appearance: Normal appearance.  HENT:     Head: Normocephalic and atraumatic.     Right Ear: Tympanic membrane, ear canal and external ear normal.     Left Ear: Tympanic membrane, ear canal and external ear normal.     Nose:     Comments: Masked     Mouth/Throat:     Comments: Masked  Eyes:     Extraocular Movements: Extraocular movements intact.     Conjunctiva/sclera: Conjunctivae normal.     Pupils: Pupils are equal, round, and reactive to light.  Cardiovascular:     Rate and Rhythm: Normal rate and regular rhythm.     Pulses: Normal pulses.     Heart sounds: Normal heart sounds.  Pulmonary:     Effort: Pulmonary effort is normal.     Breath sounds: Normal breath sounds.  Abdominal:     General: Abdomen is flat. Bowel sounds are normal.     Palpations: Abdomen is soft.  Genitourinary:    Comments: deferred Musculoskeletal:        General: Normal range of motion.     Cervical back: Normal range of motion and neck supple.     Comments: Left shin tender to palpation, no overlying erythema, vesicular lesions noted. No ecchymoses noted.   Skin:    General: Skin is warm and dry.  Neurological:     General: No focal deficit present.     Mental Status: She is alert and oriented to person, place, and time.  Psychiatric:         Mood and Affect: Mood normal.        Behavior: Behavior normal.        Assessment And Plan:     1. Encounter for general adult medical examination w/o abnormal findings Comments: A full exam was performed. Importance of monthly self breast exams was discussed with the patient. Lab results from 07/31/21 were discussed in full detail. PATIENT IS ADVISED TO GET 30-45 MINUTES REGULAR EXERCISE NO LESS THAN FOUR TO FIVE DAYS PER WEEK - BOTH WEIGHTBEARING EXERCISES AND AEROBIC ARE RECOMMENDED.  PATIENT IS ADVISED TO  FOLLOW A HEALTHY DIET WITH AT LEAST SIX FRUITS/VEGGIES PER DAY, DECREASE INTAKE OF RED MEAT, AND TO INCREASE FISH INTAKE TO TWO DAYS PER WEEK.  MEATS/FISH SHOULD NOT BE FRIED, BAKED OR BROILED IS PREFERABLE.  IT IS ALSO IMPORTANT TO CUT BACK ON YOUR SUGAR INTAKE. PLEASE AVOID ANYTHING WITH ADDED SUGAR, CORN SYRUP OR OTHER SWEETENERS. IF YOU MUST USE A SWEETENER, YOU CAN TRY STEVIA. IT IS ALSO IMPORTANT TO AVOID ARTIFICIALLY SWEETENERS AND DIET BEVERAGES. LASTLY, I SUGGEST WEARING SPF 50 SUNSCREEN ON EXPOSED PARTS AND ESPECIALLY WHEN IN THE DIRECT SUNLIGHT FOR AN EXTENDED PERIOD OF TIME.  PLEASE AVOID FAST FOOD RESTAURANTS AND INCREASE YOUR WATER INTAKE.  2. Eye exam abnormal Comments: States her most recent visit with ophthalmology was abnormal, was advised she had hemorrhage of cornea? I will request records.   3. Left leg pain Comments: Pt advised to apply Arnica gel to affected area twice daily as needed. She will let me know if her sx persist.   4. Vitamin D deficiency disease Comments: She had labs drawn last week, level is 39. Pt advised optimal levels 60-80. She will c/w vit d3 2000 IU M-F and take 4000IU on the weekends.   5. Exposure to COVID-19 virus - Novel Coronavirus, [facility] ([facility])  6. BMI 26.0-26.9,adult Comments: She is encouraged to continue with her regular exercise regimen, walking five days per week.Advised to do strength training exercises at least twice weekly.    Patient was given opportunity to ask questions. Patient verbalized understanding of the plan and was able to repeat key elements of the plan. All questions were answered to their satisfaction.   I, [name], MD, have reviewed all documentation for this visit. The documentation on 08/07/21 for the exam, diagnosis, procedures, and orders are all accurate and complete.   THE PATIENT IS ENCOURAGED TO PRACTICE SOCIAL DISTANCING DUE TO THE COVID-19 PANDEMIC.

## 2021-08-08 LAB — NOVEL CORONAVIRUS, NAA: SARS-CoV-2, NAA: NOT DETECTED

## 2021-09-06 DIAGNOSIS — F330 Major depressive disorder, recurrent, mild: Secondary | ICD-10-CM | POA: Diagnosis not present

## 2021-09-15 DIAGNOSIS — Z713 Dietary counseling and surveillance: Secondary | ICD-10-CM | POA: Diagnosis not present

## 2021-09-20 DIAGNOSIS — Z01419 Encounter for gynecological examination (general) (routine) without abnormal findings: Secondary | ICD-10-CM | POA: Diagnosis not present

## 2021-09-20 DIAGNOSIS — Z124 Encounter for screening for malignant neoplasm of cervix: Secondary | ICD-10-CM | POA: Diagnosis not present

## 2021-09-20 DIAGNOSIS — N959 Unspecified menopausal and perimenopausal disorder: Secondary | ICD-10-CM | POA: Diagnosis not present

## 2021-09-20 DIAGNOSIS — R635 Abnormal weight gain: Secondary | ICD-10-CM | POA: Diagnosis not present

## 2021-09-20 DIAGNOSIS — N649 Disorder of breast, unspecified: Secondary | ICD-10-CM | POA: Diagnosis not present

## 2021-09-20 LAB — HM PAP SMEAR: HM Pap smear: ABNORMAL

## 2021-09-21 ENCOUNTER — Encounter: Payer: Self-pay | Admitting: Internal Medicine

## 2021-10-11 DIAGNOSIS — F330 Major depressive disorder, recurrent, mild: Secondary | ICD-10-CM | POA: Diagnosis not present

## 2021-10-19 DIAGNOSIS — Z713 Dietary counseling and surveillance: Secondary | ICD-10-CM | POA: Diagnosis not present

## 2021-11-07 DIAGNOSIS — N951 Menopausal and female climacteric states: Secondary | ICD-10-CM | POA: Diagnosis not present

## 2021-11-07 DIAGNOSIS — Z01419 Encounter for gynecological examination (general) (routine) without abnormal findings: Secondary | ICD-10-CM | POA: Diagnosis not present

## 2021-11-23 DIAGNOSIS — F330 Major depressive disorder, recurrent, mild: Secondary | ICD-10-CM | POA: Diagnosis not present

## 2021-11-29 DIAGNOSIS — H40023 Open angle with borderline findings, high risk, bilateral: Secondary | ICD-10-CM | POA: Diagnosis not present

## 2021-11-29 DIAGNOSIS — H04123 Dry eye syndrome of bilateral lacrimal glands: Secondary | ICD-10-CM | POA: Diagnosis not present

## 2021-11-29 DIAGNOSIS — H2511 Age-related nuclear cataract, right eye: Secondary | ICD-10-CM | POA: Diagnosis not present

## 2021-12-07 DIAGNOSIS — F330 Major depressive disorder, recurrent, mild: Secondary | ICD-10-CM | POA: Diagnosis not present

## 2021-12-13 DIAGNOSIS — J301 Allergic rhinitis due to pollen: Secondary | ICD-10-CM | POA: Diagnosis not present

## 2021-12-13 DIAGNOSIS — R42 Dizziness and giddiness: Secondary | ICD-10-CM | POA: Diagnosis not present

## 2021-12-13 DIAGNOSIS — G43819 Other migraine, intractable, without status migrainosus: Secondary | ICD-10-CM | POA: Diagnosis not present

## 2021-12-13 DIAGNOSIS — H903 Sensorineural hearing loss, bilateral: Secondary | ICD-10-CM | POA: Diagnosis not present

## 2021-12-14 ENCOUNTER — Other Ambulatory Visit: Payer: Self-pay | Admitting: Otolaryngology

## 2021-12-14 DIAGNOSIS — R42 Dizziness and giddiness: Secondary | ICD-10-CM

## 2021-12-27 ENCOUNTER — Ambulatory Visit
Admission: RE | Admit: 2021-12-27 | Discharge: 2021-12-27 | Disposition: A | Discharge status: Home / Self Care | Payer: Federal, State, Local not specified - PPO | Source: Ambulatory Visit | Attending: Otolaryngology | Admitting: Otolaryngology

## 2021-12-27 DIAGNOSIS — R42 Dizziness and giddiness: Secondary | ICD-10-CM | POA: Diagnosis not present

## 2021-12-27 DIAGNOSIS — R519 Headache, unspecified: Secondary | ICD-10-CM | POA: Diagnosis not present

## 2021-12-27 DIAGNOSIS — F330 Major depressive disorder, recurrent, mild: Secondary | ICD-10-CM | POA: Diagnosis not present

## 2021-12-27 MED ORDER — GADOBENATE DIMEGLUMINE 529 MG/ML IV SOLN
16.0000 mL | Freq: Once | INTRAVENOUS | Status: AC | PRN
  Administered 2021-12-27: 16 mL via INTRAVENOUS

## 2022-01-08 ENCOUNTER — Other Ambulatory Visit: Payer: Self-pay | Admitting: Internal Medicine

## 2022-01-08 ENCOUNTER — Other Ambulatory Visit: Payer: Federal, State, Local not specified - PPO

## 2022-01-08 DIAGNOSIS — Z1231 Encounter for screening mammogram for malignant neoplasm of breast: Secondary | ICD-10-CM

## 2022-01-12 ENCOUNTER — Other Ambulatory Visit: Payer: Self-pay | Admitting: Internal Medicine

## 2022-01-16 DIAGNOSIS — J301 Allergic rhinitis due to pollen: Secondary | ICD-10-CM | POA: Diagnosis not present

## 2022-01-19 ENCOUNTER — Encounter: Payer: Self-pay | Admitting: Internal Medicine

## 2022-01-24 DIAGNOSIS — F330 Major depressive disorder, recurrent, mild: Secondary | ICD-10-CM | POA: Diagnosis not present

## 2022-01-29 ENCOUNTER — Ambulatory Visit: Payer: Federal, State, Local not specified - PPO | Admitting: Internal Medicine

## 2022-01-29 ENCOUNTER — Encounter: Payer: Self-pay | Admitting: Internal Medicine

## 2022-01-29 ENCOUNTER — Other Ambulatory Visit: Payer: Self-pay | Admitting: Internal Medicine

## 2022-01-29 VITALS — BP 122/78 | HR 72 | Temp 98.0°F | Ht 67.8 in | Wt 166.6 lb

## 2022-01-29 DIAGNOSIS — K30 Functional dyspepsia: Secondary | ICD-10-CM

## 2022-01-29 DIAGNOSIS — R7303 Prediabetes: Secondary | ICD-10-CM

## 2022-01-29 DIAGNOSIS — Z6825 Body mass index (BMI) 25.0-25.9, adult: Secondary | ICD-10-CM

## 2022-01-29 DIAGNOSIS — G8929 Other chronic pain: Secondary | ICD-10-CM | POA: Diagnosis not present

## 2022-01-29 DIAGNOSIS — G43809 Other migraine, not intractable, without status migrainosus: Secondary | ICD-10-CM | POA: Diagnosis not present

## 2022-01-29 DIAGNOSIS — M25562 Pain in left knee: Secondary | ICD-10-CM

## 2022-01-29 MED ORDER — AMOXICILLIN 500 MG PO TABS: ORAL_TABLET | ORAL | 2 refills | Status: AC

## 2022-01-29 MED ORDER — FAMOTIDINE 20 MG PO TABS: 20.0000 mg | ORAL_TABLET | Freq: Two times a day (BID) | ORAL | 1 refills | Status: DC

## 2022-01-29 NOTE — Patient Instructions (Addendum)
Magnesium glycinate, 1 capsule nightly  Prediabetes Prediabetes is when your blood sugar (blood glucose) level is higher than normal but not high enough for you to be diagnosed with type 2 diabetes. Having prediabetes puts you at risk for developing type 2 diabetes (type 2 diabetes mellitus). With certain lifestyle changes, you may be able to prevent or delay the onset of type 2 diabetes. This is important because type 2 diabetes can lead to serious complications, such as: Heart disease. Stroke. Blindness. Kidney disease. Depression. Poor circulation in the feet and legs. In severe cases, this could lead to surgical removal of a leg (amputation). What are the causes? The exact cause of prediabetes is not known. It may result from insulin resistance. Insulin resistance develops when cells in the body do not respond properly to insulin that the body makes. This can cause excess glucose to build up in the blood. High blood glucose (hyperglycemia) can develop. What increases the risk? The following factors may make you more likely to develop this condition: You have a family member with type 2 diabetes. You are older than 45 years. You had a temporary form of diabetes during a pregnancy (gestational diabetes). You had polycystic ovary syndrome (PCOS). You are overweight or obese. You are inactive (sedentary). You have a history of heart disease, including problems with cholesterol levels, high levels of blood fats, or high blood pressure. What are the signs or symptoms? You may have no symptoms. If you do have symptoms, they may include: Increased hunger. Increased thirst. Increased urination. Vision changes, such as blurry vision. Tiredness (fatigue). How is this diagnosed? This condition can be diagnosed with blood tests. Your blood glucose may be checked with one or more of the following tests: A fasting blood glucose (FBG) test. You will not be allowed to eat (you will fast) for at  least 8 hours before a blood sample is taken. An A1C blood test (hemoglobin A1C). This test provides information about blood glucose levels over the previous 2?3 months. An oral glucose tolerance test (OGTT). This test measures your blood glucose at two points in time: After fasting. This is your baseline level. Two hours after you drink a beverage that contains glucose. You may be diagnosed with prediabetes if: Your FBG is 100?125 mg/dL (5.6-6.9 mmol/L). Your A1C level is 5.7?6.4% (39-46 mmol/mol). Your OGTT result is 140?199 mg/dL (7.8-11 mmol/L). These blood tests may be repeated to confirm your diagnosis. How is this treated? Treatment may include dietary and lifestyle changes to help lower your blood glucose and prevent type 2 diabetes from developing. In some cases, medicine may be prescribed to help lower the risk of type 2 diabetes. Follow these instructions at home: Nutrition  Follow a healthy meal plan. This includes eating lean proteins, whole grains, legumes, fresh fruits and vegetables, low-fat dairy products, and healthy fats. Follow instructions from your health care provider about eating or drinking restrictions. Meet with a dietitian to create a healthy eating plan that is right for you. Lifestyle Do moderate-intensity exercise for at least 30 minutes a day on 5 or more days each week, or as told by your health care provider. A mix of activities may be best, such as: Brisk walking, swimming, biking, and weight lifting. Lose weight as told by your health care provider. Losing 5-7% of your body weight can reverse insulin resistance. Do not drink alcohol if: Your health care provider tells you not to drink. You are pregnant, may be pregnant, or are planning to  become pregnant. If you drink alcohol: Limit how much you use to: 0-1 drink a day for women. 0-2 drinks a day for men. Be aware of how much alcohol is in your drink. In the [place] one drink equals one 12 oz bottle of  beer (355 mL), one 5 oz glass of wine (148 mL), or one 1 oz glass of hard liquor (44 mL). General instructions Take over-the-counter and prescription medicines only as told by your health care provider. You may be prescribed medicines that help lower the risk of type 2 diabetes. Do not use any products that contain nicotine or tobacco, such as cigarettes, e-cigarettes, and chewing tobacco. If you need help quitting, ask your health care provider. Keep all follow-up visits. This is important. Where to find more information American Diabetes Association: www.diabetes.org Academy of Nutrition and Dietetics: www.eatright.org American Heart Association: www.heart.org Contact a health care provider if: You have any of these symptoms: Increased hunger. Increased urination. Increased thirst. Fatigue. Vision changes, such as blurry vision. Get help right away if you: Have shortness of breath. Feel confused. Vomit or feel like you may vomit. Summary Prediabetes is when your blood sugar (blood glucose)level is higher than normal but not high enough for you to be diagnosed with type 2 diabetes. Having prediabetes puts you at risk for developing type 2 diabetes (type 2 diabetes mellitus). Make lifestyle changes such as eating a healthy diet and exercising regularly to help prevent diabetes. Lose weight as told by your health care provider. This information is not intended to replace advice given to you by your health care provider. Make sure you discuss any questions you have with your health care provider. Document Revised: 11/12/2019 Document Reviewed: 11/12/2019 Elsevier Patient Education  [employer].

## 2022-01-29 NOTE — Progress Notes (Signed)
Rich Brave Llittleton,acting as a Education administrator for Maximino Greenland, MD.,have documented all relevant documentation on the behalf of Maximino Greenland, MD,as directed by  Maximino Greenland, MD while in the presence of Maximino Greenland, MD.  This visit occurred during the SARS-CoV-2 public health emergency.  Safety protocols were in place, including screening questions prior to the visit, additional usage of staff PPE, and extensive cleaning of exam room while observing appropriate contact time as indicated for disinfecting solutions.  Subjective:     Patient ID: [name] , female    DOB: 26-Jul-1966 , 57 y.o.   MRN: [id number]   Chief Complaint  Patient presents with   Prediabetes    HPI  The patient is here today for a f/u of prediabetes. She is not taking any meds for this. She reports seeing specialists for a variety of issues. She is now followed by ENT/Allergy specialist in [place] for management of allergies, which have been triggering her migraines.  She is currently awaiting to have left knee arthroscopic surgery, related to Workmen's Comp issue.     Past Medical History:  Diagnosis Date   Allergy-induced asthma    no current med.   Complication of anesthesia    states is hard to get to sleep and hard to wake up   History of CVA (cerebrovascular accident) 2004   states no deficits   Innocent heart murmur    per pt.   Medial meniscus tear 08/2016   left knee   Seasonal allergies      Family History  Problem Relation Age of Onset   Hypertension Mother    Diabetes Mother    Hyperlipidemia Mother    Heart disease Mother    Pancreatic cancer Father    Diabetes Sister    Hypertension Sister    Hyperlipidemia Sister      Current Outpatient Medications:    Calcium-Vitamin D-Vitamin K 6394921205-90 MG-UNT-MCG TABS, Take 1 tablet by mouth daily., Disp: , Rfl:    cetirizine (ZYRTEC) 10 MG tablet, Take 10 mg by mouth daily., Disp: , Rfl:    doxycycline (VIBRAMYCIN) 100 MG  capsule, Take 100 mg by mouth daily at 2 PM., Disp: , Rfl:    estradiol (CLIMARA - DOSED IN MG/24 HR) 0.05 mg/24hr patch, Place 0.05 mg onto the skin once a week., Disp: , Rfl:    famotidine (PEPCID) 20 MG tablet, Take 1 tablet (20 mg total) by mouth 2 (two) times daily., Disp: 60 tablet, Rfl: 1   ketoconazole (NIZORAL) 2 % cream, Apply 1 application topically 3 (three) times a week., Disp: , Rfl:    NONFORMULARY OR COMPOUNDED ITEM, Take 5 mLs by mouth at bedtime. Previden rinse 0.2% sodium fluoride, Disp: , Rfl:    progesterone (PROMETRIUM) 200 MG capsule, Take 200 mg by mouth daily., Disp: , Rfl:    rosuvastatin (CRESTOR) 10 MG tablet, TAKE 1 TABLET BY MOUTH EVERYDAY AT BEDTIME, Disp: 90 tablet, Rfl: 2   XIIDRA 5 % SOLN, Apply 1 drop to eye 2 (two) times daily. , Disp: , Rfl: 3   amoxicillin (AMOXIL) 500 MG tablet, Take 4 tablets one hour prior to dental appt prn, Disp: 4 tablet, Rfl: 2   clobetasol ointment (TEMOVATE) 0.05 %, Apply topically 3 (three) times a week., Disp: , Rfl:    minoxidil (ROGAINE) 2 % external solution, Apply topically., Disp: , Rfl:    RYALTRIS 665-25 MCG/ACT SUSP, SMARTSIG:1 Spray(s) Both Nares 1-2 Times Daily, Disp: , Rfl:  SODIUM FLUORIDE, DENTAL RINSE, 0.2 % SOLN, Take by mouth as directed., Disp: , Rfl:    venlafaxine XR (EFFEXOR-XR) 75 MG 24 hr capsule, Take 75 mg by mouth daily., Disp: , Rfl:    Allergies  Allergen Reactions   Other     Other reaction(s): Respiratory Distress Other reaction(s): Respiratory Distress Other reaction(s): Eye Redness   Tomato Anaphylaxis     Review of Systems  Constitutional: Negative.   Eyes: Negative.   Respiratory: Negative.    Cardiovascular: Negative.   Gastrointestinal: Negative.        At end of visit, she mentioned having indigestion. She only eats twice daily - 5am and 2pm. Unable to determine what triggers her sx.   Endocrine: Negative for polydipsia, polyphagia and polyuria.  Musculoskeletal:  Positive for  arthralgias.       She has left knee pain, this is chronic. This is workmen's comp issue, followed by Dr. Mayer Camel.   Skin: Negative.   Neurological:  Positive for headaches.  Psychiatric/Behavioral: Negative.      Today's Vitals   01/29/22 0830  BP: 122/78  Pulse: 72  Temp: 98 F (36.7 C)  Weight: 166 lb 9.6 oz (75.6 kg)  Height: 5' 7.8" (1.722 m)  PainSc: 0-No pain   Body mass index is 25.48 kg/m.  Wt Readings from Last 3 Encounters:  01/29/22 166 lb 9.6 oz (75.6 kg)  08/07/21 170 lb 6.4 oz (77.3 kg)  04/03/21 161 lb 12.8 oz (73.4 kg)     Objective:  Physical Exam Vitals and nursing note reviewed.  Constitutional:      Appearance: Normal appearance.  HENT:     Head: Normocephalic and atraumatic.  Eyes:     Extraocular Movements: Extraocular movements intact.  Cardiovascular:     Rate and Rhythm: Normal rate and regular rhythm.     Heart sounds: Normal heart sounds.  Pulmonary:     Effort: Pulmonary effort is normal.     Breath sounds: Normal breath sounds.  Musculoskeletal:     Cervical back: Normal range of motion.  Skin:    General: Skin is warm.  Neurological:     General: No focal deficit present.     Mental Status: She is alert.  Psychiatric:        Mood and Affect: Mood normal.        Behavior: Behavior normal.        Assessment And Plan:     1. Prediabetes Comments: Chronic, I will check labs as below. She will rto in Jan 2024 for her next physical examination.  Encouraged to avoid refined carbs.  - Hemoglobin A1c - Insulin, random(561) - CMP14+EGFR  2. Other migraine without status migrainosus, not intractable Comments: Triggered by her allergies. Now followed by ENT/Allergist. She is encouraged to incorporate magnesium nightly.   3. Indigestion Comments: I will send rx famotidine to take twice daily x 2 weeks, then once daily.   4. Chronic pain of left knee Comments: Workmen's comp issue, as per Ortho.  5. BMI 25.0-25.9,adult Comments: She  is encouraged to aim for at least 150 minutes of exercise per week.     Patient was given opportunity to ask questions. Patient verbalized understanding of the plan and was able to repeat key elements of the plan. All questions were answered to their satisfaction.   I, Maximino Greenland, MD, have reviewed all documentation for this visit. The documentation on 01/29/22 for the exam, diagnosis, procedures, and orders are all accurate  and complete.   IF YOU HAVE BEEN REFERRED TO A SPECIALIST, IT MAY TAKE 1-2 WEEKS TO SCHEDULE/PROCESS THE REFERRAL. IF YOU HAVE NOT HEARD FROM US/SPECIALIST IN TWO WEEKS, PLEASE GIVE US A CALL AT [phone].   THE PATIENT IS ENCOURAGED TO PRACTICE SOCIAL DISTANCING DUE TO THE COVID-19 PANDEMIC.   

## 2022-01-30 DIAGNOSIS — R42 Dizziness and giddiness: Secondary | ICD-10-CM | POA: Diagnosis not present

## 2022-01-30 LAB — HEMOGLOBIN A1C
Est. average glucose Bld gHb Est-mCnc: 123 mg/dL
Hgb A1c MFr Bld: 5.9 % — ABNORMAL HIGH (ref 4.8–5.6)

## 2022-01-30 LAB — CMP14+EGFR
ALT: 15 IU/L (ref 0–32)
AST: 19 IU/L (ref 0–40)
Albumin/Globulin Ratio: 1.6 (ref 1.2–2.2)
Albumin: 4.4 g/dL (ref 3.8–4.9)
Alkaline Phosphatase: 88 IU/L (ref 44–121)
BUN/Creatinine Ratio: 11 (ref 9–23)
BUN: 9 mg/dL (ref 6–24)
Bilirubin Total: 0.2 mg/dL (ref 0.0–1.2)
CO2: 23 mmol/L (ref 20–29)
Calcium: 9.6 mg/dL (ref 8.7–10.2)
Chloride: 106 mmol/L (ref 96–106)
Creatinine, Ser: 0.83 mg/dL (ref 0.57–1.00)
Globulin, Total: 2.8 g/dL (ref 1.5–4.5)
Glucose: 78 mg/dL (ref 70–99)
Potassium: 4.3 mmol/L (ref 3.5–5.2)
Sodium: 142 mmol/L (ref 134–144)
Total Protein: 7.2 g/dL (ref 6.0–8.5)
eGFR: 83 mL/min/{1.73_m2} (ref >59)

## 2022-01-30 LAB — INSULIN, RANDOM: INSULIN: 13.3 u[IU]/mL (ref 2.6–24.9)

## 2022-02-02 ENCOUNTER — Ambulatory Visit
Admission: RE | Admit: 2022-02-02 | Discharge: 2022-02-02 | Disposition: A | Discharge status: Home / Self Care | Payer: Federal, State, Local not specified - PPO | Source: Ambulatory Visit | Attending: Internal Medicine | Admitting: Internal Medicine

## 2022-02-02 DIAGNOSIS — Z1231 Encounter for screening mammogram for malignant neoplasm of breast: Secondary | ICD-10-CM | POA: Diagnosis not present

## 2022-02-08 ENCOUNTER — Ambulatory Visit: Payer: Federal, State, Local not specified - PPO | Admitting: Podiatry

## 2022-02-08 DIAGNOSIS — L089 Local infection of the skin and subcutaneous tissue, unspecified: Secondary | ICD-10-CM | POA: Diagnosis not present

## 2022-02-08 DIAGNOSIS — L669 Cicatricial alopecia, unspecified: Secondary | ICD-10-CM | POA: Diagnosis not present

## 2022-02-21 DIAGNOSIS — F330 Major depressive disorder, recurrent, mild: Secondary | ICD-10-CM | POA: Diagnosis not present

## 2022-02-23 DIAGNOSIS — R42 Dizziness and giddiness: Secondary | ICD-10-CM | POA: Diagnosis not present

## 2022-03-04 DIAGNOSIS — S63502A Unspecified sprain of left wrist, initial encounter: Secondary | ICD-10-CM | POA: Diagnosis not present

## 2022-03-05 DIAGNOSIS — H40023 Open angle with borderline findings, high risk, bilateral: Secondary | ICD-10-CM | POA: Diagnosis not present

## 2022-03-05 DIAGNOSIS — H31093 Other chorioretinal scars, bilateral: Secondary | ICD-10-CM | POA: Diagnosis not present

## 2022-03-05 DIAGNOSIS — H04123 Dry eye syndrome of bilateral lacrimal glands: Secondary | ICD-10-CM | POA: Diagnosis not present

## 2022-03-05 DIAGNOSIS — H2511 Age-related nuclear cataract, right eye: Secondary | ICD-10-CM | POA: Diagnosis not present

## 2022-03-09 HISTORY — PX: KNEE ARTHROSCOPY: SHX127

## 2022-03-13 DIAGNOSIS — S63502A Unspecified sprain of left wrist, initial encounter: Secondary | ICD-10-CM | POA: Diagnosis not present

## 2022-03-27 ENCOUNTER — Ambulatory Visit: Payer: Federal, State, Local not specified - PPO | Admitting: Podiatry

## 2022-03-27 ENCOUNTER — Encounter: Payer: Self-pay | Admitting: Podiatry

## 2022-03-27 DIAGNOSIS — K2930 Chronic superficial gastritis without bleeding: Secondary | ICD-10-CM | POA: Insufficient documentation

## 2022-03-27 DIAGNOSIS — R141 Gas pain: Secondary | ICD-10-CM | POA: Insufficient documentation

## 2022-03-27 DIAGNOSIS — R194 Change in bowel habit: Secondary | ICD-10-CM | POA: Insufficient documentation

## 2022-03-27 DIAGNOSIS — R140 Abdominal distension (gaseous): Secondary | ICD-10-CM | POA: Insufficient documentation

## 2022-03-27 DIAGNOSIS — M79676 Pain in unspecified toe(s): Secondary | ICD-10-CM | POA: Diagnosis not present

## 2022-03-27 DIAGNOSIS — K219 Gastro-esophageal reflux disease without esophagitis: Secondary | ICD-10-CM | POA: Insufficient documentation

## 2022-03-27 DIAGNOSIS — K4090 Unilateral inguinal hernia, without obstruction or gangrene, not specified as recurrent: Secondary | ICD-10-CM | POA: Insufficient documentation

## 2022-03-27 DIAGNOSIS — B351 Tinea unguium: Secondary | ICD-10-CM | POA: Diagnosis not present

## 2022-03-27 DIAGNOSIS — R1032 Left lower quadrant pain: Secondary | ICD-10-CM | POA: Insufficient documentation

## 2022-03-27 DIAGNOSIS — R1012 Left upper quadrant pain: Secondary | ICD-10-CM | POA: Insufficient documentation

## 2022-03-27 NOTE — Progress Notes (Signed)
She presents today after having not seen her for year with a chief concern of thick pincer type toenails.  She states they have been going on now for several months they are rubbing in her shoes and starting to cause pain when she is trying to be active.  Objective: Vital signs are stable alert and oriented x3.  Pulses are palpable.  She has flexible hammertoe deformities which is most likely resulting in thickening of her toenails.  We had sampled these toenails a while back and determined that this was primarily a nail dystrophy issue.  Assessment: Nail dystrophy.  Plan: Debrided the nails today bilaterally and debrided them short and thin.  Follow-up with her as needed

## 2022-03-28 DIAGNOSIS — F330 Major depressive disorder, recurrent, mild: Secondary | ICD-10-CM | POA: Diagnosis not present

## 2022-04-03 DIAGNOSIS — M25532 Pain in left wrist: Secondary | ICD-10-CM | POA: Diagnosis not present

## 2022-04-03 DIAGNOSIS — G5622 Lesion of ulnar nerve, left upper limb: Secondary | ICD-10-CM | POA: Diagnosis not present

## 2022-04-10 DIAGNOSIS — M25532 Pain in left wrist: Secondary | ICD-10-CM | POA: Diagnosis not present

## 2022-04-11 DIAGNOSIS — G43809 Other migraine, not intractable, without status migrainosus: Secondary | ICD-10-CM | POA: Diagnosis not present

## 2022-04-11 DIAGNOSIS — E559 Vitamin D deficiency, unspecified: Secondary | ICD-10-CM | POA: Diagnosis not present

## 2022-04-18 DIAGNOSIS — Z713 Dietary counseling and surveillance: Secondary | ICD-10-CM | POA: Diagnosis not present

## 2022-04-25 DIAGNOSIS — Z713 Dietary counseling and surveillance: Secondary | ICD-10-CM | POA: Diagnosis not present

## 2022-05-01 DIAGNOSIS — G5622 Lesion of ulnar nerve, left upper limb: Secondary | ICD-10-CM | POA: Diagnosis not present

## 2022-05-02 DIAGNOSIS — Z713 Dietary counseling and surveillance: Secondary | ICD-10-CM | POA: Diagnosis not present

## 2022-05-03 ENCOUNTER — Encounter: Payer: Self-pay | Admitting: Internal Medicine

## 2022-05-03 ENCOUNTER — Ambulatory Visit: Payer: Federal, State, Local not specified - PPO | Admitting: Internal Medicine

## 2022-05-03 VITALS — BP 120/80 | HR 80 | Temp 98.0°F | Ht 67.8 in | Wt 168.2 lb

## 2022-05-03 DIAGNOSIS — R7303 Prediabetes: Secondary | ICD-10-CM | POA: Diagnosis not present

## 2022-05-03 DIAGNOSIS — Z2821 Immunization not carried out because of patient refusal: Secondary | ICD-10-CM

## 2022-05-03 DIAGNOSIS — F330 Major depressive disorder, recurrent, mild: Secondary | ICD-10-CM | POA: Diagnosis not present

## 2022-05-03 DIAGNOSIS — Z6825 Body mass index (BMI) 25.0-25.9, adult: Secondary | ICD-10-CM

## 2022-05-03 DIAGNOSIS — G43809 Other migraine, not intractable, without status migrainosus: Secondary | ICD-10-CM

## 2022-05-03 LAB — HEMOGLOBIN A1C
Est. average glucose Bld gHb Est-mCnc: 126 mg/dL
Hgb A1c MFr Bld: 6 % — ABNORMAL HIGH (ref 4.8–5.6)

## 2022-05-03 NOTE — Patient Instructions (Signed)

## 2022-05-03 NOTE — Progress Notes (Signed)
Jeri Cos Llittleton,acting as a Neurosurgeon for [name], MD.,have documented all relevant documentation on the behalf of [name], MD,as directed by  [name], MD while in the presence of [name], MD.    Subjective:     Patient ID: [name] , female    DOB: Aug 03, 1966 , 56 y.o.   MRN: [id number]   Chief Complaint  Patient presents with   Prediabetes    HPI  The patient is here today for a f/u of prediabetes. She feels well, unfortunately had a lot of issues this Spring due to her allergies, vestibular migraines and vertigo. Her sx seem to have stabilized at this time.      Past Medical History:  Diagnosis Date   Allergy-induced asthma    no current med.   Complication of anesthesia    states is hard to get to sleep and hard to wake up   History of CVA (cerebrovascular accident) 2004   states no deficits   Innocent heart murmur    per pt.   Medial meniscus tear 08/2016   left knee   Seasonal allergies      Family History  Problem Relation Age of Onset   Hypertension Mother    Diabetes Mother    Hyperlipidemia Mother    Heart disease Mother    Pancreatic cancer Father    Diabetes Sister    Hypertension Sister    Hyperlipidemia Sister      Current Outpatient Medications:    amoxicillin (AMOXIL) 500 MG tablet, Take 4 tablets one hour prior to dental appt prn, Disp: 4 tablet, Rfl: 2   Calcium-Vitamin D-Vitamin K 8507438888-90 MG-UNT-MCG TABS, Take 1 tablet by mouth daily., Disp: , Rfl:    cetirizine (ZYRTEC) 10 MG tablet, Take 10 mg by mouth daily., Disp: , Rfl:    clobetasol ointment (TEMOVATE) 0.05 %, Apply topically 3 (three) times a week., Disp: , Rfl:    estradiol (VIVELLE-DOT) 0.05 MG/24HR patch, Place 1 patch onto the skin 2 (two) times a week., Disp: , Rfl:    progesterone (PROMETRIUM) 200 MG capsule, Take 200 mg by mouth daily., Disp: , Rfl:    rosuvastatin (CRESTOR) 10 MG tablet, TAKE 1 TABLET BY MOUTH EVERYDAY AT  BEDTIME, Disp: 90 tablet, Rfl: 2   RYALTRIS 665-25 MCG/ACT SUSP, SMARTSIG:1 Spray(s) Both Nares 1-2 Times Daily, Disp: , Rfl:    SODIUM FLUORIDE, DENTAL RINSE, 0.2 % SOLN, Take by mouth as directed., Disp: , Rfl:    XIIDRA 5 % SOLN, Apply 1 drop to eye 2 (two) times daily. , Disp: , Rfl: 3   furosemide (LASIX) 20 MG tablet, Take 1-2 tablets in the morning as needed for leg swelling., Disp: 14 tablet, Rfl: 0   Allergies  Allergen Reactions   Other     Other reaction(s): Respiratory Distress Other reaction(s): Respiratory Distress Other reaction(s): Eye Redness   Tomato Anaphylaxis   Grass Pollen(K-O-R-T-Swt Vern)     Other reaction(s): Other     Review of Systems  Constitutional: Negative.   Eyes: Negative.   Respiratory: Negative.    Cardiovascular: Negative.   Gastrointestinal: Negative.   Endocrine: Negative for polydipsia, polyphagia and polyuria.  Musculoskeletal: Negative.   Skin: Negative.   Neurological: Negative.   Psychiatric/Behavioral: Negative.       Today's Vitals   05/03/22 1035  BP: 120/80  Pulse: 80  Temp: 98 F (36.7 C)  Weight: 168 lb 3.2 oz (76.3 kg)  Height:  5' 7.8" (1.722 m)  PainSc: 0-No pain   Body mass index is 25.73 kg/m.  Wt Readings from Last 3 Encounters:  05/03/22 168 lb 3.2 oz (76.3 kg)  01/29/22 166 lb 9.6 oz (75.6 kg)  08/07/21 170 lb 6.4 oz (77.3 kg)     Objective:  Physical Exam Vitals and nursing note reviewed.  Constitutional:      Appearance: Normal appearance.  HENT:     Head: Normocephalic and atraumatic.  Eyes:     Extraocular Movements: Extraocular movements intact.  Cardiovascular:     Rate and Rhythm: Normal rate and regular rhythm.     Heart sounds: Normal heart sounds.  Pulmonary:     Effort: Pulmonary effort is normal.     Breath sounds: Normal breath sounds.  Musculoskeletal:     Cervical back: Normal range of motion.  Skin:    General: Skin is warm.  Neurological:     General: No focal deficit present.      Mental Status: She is alert.  Psychiatric:        Mood and Affect: Mood normal.        Behavior: Behavior normal.         Assessment And Plan:     1. Prediabetes Comments: Chronic, previous labs reviewed. I will recheck an a1c today, encouraged to limit her intake of refined carbs and processed meats.  - Hemoglobin A1c  2. Vestibular migraine Comments: Neuro notes reviewed in Care Everywhere, their input is appreciated.   3. BMI 25.0-25.9,adult Comments: She is encouraged to gradually increase her daily activity level, aiming for at least 150 minutes of exercise per week.   4. Influenza vaccination declined     Patient was given opportunity to ask questions. Patient verbalized understanding of the plan and was able to repeat key elements of the plan. All questions were answered to their satisfaction.   I, [name], MD, have reviewed all documentation for this visit. The documentation on 05/03/22 for the exam, diagnosis, procedures, and orders are all accurate and complete.   IF YOU HAVE BEEN REFERRED TO A SPECIALIST, IT MAY TAKE 1-2 WEEKS TO SCHEDULE/PROCESS THE REFERRAL. IF YOU HAVE NOT HEARD FROM US/SPECIALIST IN TWO WEEKS, PLEASE GIVE [place] A CALL AT [phone].   THE PATIENT IS ENCOURAGED TO PRACTICE SOCIAL DISTANCING DUE TO THE COVID-19 PANDEMIC.

## 2022-05-09 DIAGNOSIS — Z713 Dietary counseling and surveillance: Secondary | ICD-10-CM | POA: Diagnosis not present

## 2022-05-16 ENCOUNTER — Encounter (HOSPITAL_COMMUNITY): Payer: Self-pay

## 2022-05-16 ENCOUNTER — Ambulatory Visit (HOSPITAL_COMMUNITY)
Admission: EM | Admit: 2022-05-16 | Discharge: 2022-05-16 | Disposition: A | Discharge status: Home / Self Care | Payer: Federal, State, Local not specified - PPO | Attending: Family Medicine | Admitting: Family Medicine

## 2022-05-16 DIAGNOSIS — R600 Localized edema: Secondary | ICD-10-CM | POA: Diagnosis not present

## 2022-05-16 LAB — COMPREHENSIVE METABOLIC PANEL
ALT: 15 U/L (ref 0–44)
AST: 20 U/L (ref 15–41)
Albumin: 4.2 g/dL (ref 3.5–5.0)
Alkaline Phosphatase: 57 U/L (ref 38–126)
Anion gap: 10 (ref 5–15)
BUN: 13 mg/dL (ref 6–20)
CO2: 24 mmol/L (ref 22–32)
Calcium: 9.7 mg/dL (ref 8.9–10.3)
Chloride: 108 mmol/L (ref 98–111)
Creatinine, Ser: 0.95 mg/dL (ref 0.44–1.00)
GFR, Estimated: >60 mL/min (ref >60)
Glucose, Bld: 87 mg/dL (ref 70–99)
Potassium: 3.4 mmol/L — ABNORMAL LOW (ref 3.5–5.1)
Sodium: 142 mmol/L (ref 135–145)
Total Bilirubin: 0.6 mg/dL (ref 0.3–1.2)
Total Protein: 7.8 g/dL (ref 6.5–8.1)

## 2022-05-16 LAB — CBC
HCT: 35.1 % — ABNORMAL LOW (ref 36.0–46.0)
Hemoglobin: 11.4 g/dL — ABNORMAL LOW (ref 12.0–15.0)
MCH: 29.1 pg (ref 26.0–34.0)
MCHC: 32.5 g/dL (ref 30.0–36.0)
MCV: 89.5 fL (ref 80.0–100.0)
Platelets: 369 10*3/uL (ref 150–400)
RBC: 3.92 MIL/uL (ref 3.87–5.11)
RDW: 14.3 % (ref 11.5–15.5)
WBC: 8.3 10*3/uL (ref 4.0–10.5)
nRBC: 0 % (ref 0.0–0.2)

## 2022-05-16 MED ORDER — FUROSEMIDE 20 MG PO TABS: ORAL_TABLET | ORAL | 0 refills | Status: DC

## 2022-05-16 NOTE — Discharge Instructions (Signed)
Swelling happens when fluid collects in small spaces around tissues and organs inside the body. Another word for swelling is "edema." Some common parts of the body where people can have swelling are the lower legs or hands. This typically is worse in the areas of the body that are closest to the ground (because of gravity)  Symptoms of swelling can include puffiness of the skin, which can cause the skin to look stretched and shiny. This often occurs with swelling in the lower legs and can be worse after you sit or stand for a long time.  Treatment of edema includes several components: treatment of the underlying cause (if possible), reducing the amount of salt (sodium) in your diet, and, in many cases, use of a medication called a diuretic to eliminate excess fluid. Using compression stockings and elevating the legs may also be recommended.   You have had labs (blood work) drawn today. We will call you with any significant abnormalities or if there is need to begin or change treatment or pursue further follow up.  You may also review your test results online through MyChart. If you do not have a MyChart account, instructions to sign up should be on your discharge paperwork.  

## 2022-05-16 NOTE — ED Triage Notes (Signed)
Patient with c/o left leg/calf pain and swelling. Patient states it has progressively gotten worse over the past couple of days. States now her left foot is tingling.

## 2022-05-17 ENCOUNTER — Ambulatory Visit (HOSPITAL_COMMUNITY)
Admission: RE | Admit: 2022-05-17 | Discharge: 2022-05-17 | Disposition: A | Discharge status: Home / Self Care | Payer: Federal, State, Local not specified - PPO | Source: Ambulatory Visit | Attending: Family Medicine | Admitting: Family Medicine

## 2022-05-17 DIAGNOSIS — M79662 Pain in left lower leg: Secondary | ICD-10-CM | POA: Insufficient documentation

## 2022-05-17 DIAGNOSIS — M79609 Pain in unspecified limb: Secondary | ICD-10-CM

## 2022-05-17 DIAGNOSIS — M7989 Other specified soft tissue disorders: Secondary | ICD-10-CM | POA: Diagnosis not present

## 2022-05-17 NOTE — Progress Notes (Signed)
Lower extremity venous left study completed.   Please see CV Proc for preliminary results.   Laloni Rowton, RDMS, RVT  

## 2022-05-19 NOTE — ED Provider Notes (Signed)
[name]   [id number] 05/16/22 Arrival Time: 1859  ASSESSMENT & PLAN:  1. Lower extremity edema    Low susp for DVT but cannot completely r/o.  Orders Placed This Encounter  Procedures   CBC   Comprehensive metabolic panel   LE VENOUS  For LE venous doppler tomorrow am; instructions on AVS.  Meds ordered this encounter  Medications   furosemide (LASIX) 20 MG tablet    Sig: Take 1-2 tablets in the morning as needed for leg swelling.    Dispense:  14 tablet    Refill:  0   Reviewed expectations re: course of current medical issues. Questions answered. Outlined signs and symptoms indicating need for more acute intervention. Patient verbalized understanding. After Visit Summary given.   SUBJECTIVE:  History from: patient. [name] is a 56 y.o. female who presents with complaint of LLE swelling/pain; 2 days; seems stable. Mostly around ankle but does feel pain in calf. No injury. No extremity sensation changes or weakness. Swelling somewhat better in am vs pm. No recent travel or prolonged immobility. No new medications. Recreational drug use: denied.  Social History   Tobacco Use  Smoking Status Never  Smokeless Tobacco Never   Social History   Substance and Sexual Activity  Alcohol Use Yes   Alcohol/week: 0.0 standard drinks of alcohol   Comment: occasionally    OBJECTIVE:  Vitals:   05/16/22 1915  BP: 134/72  Pulse: 87  Resp: 16  Temp: 98.4 F (36.9 C)  TempSrc: Oral  SpO2: 100%    General appearance: alert, oriented, no acute distress Eyes: PERRLA; EOMI; conjunctivae normal HENT: normocephalic; atraumatic Neck: supple with FROM Lungs: without labored respirations; speaks full sentences without difficulty; CTAB Heart: regular rate and rhythm without murmer Chest Wall: without tenderness to palpation Abdomen: soft, non-tender; no guarding or rebound tenderness Extremities: with 1+ edema around L ankle extending slightly  into calf; is TTP Skin: warm and dry; without rash or lesions Neuro: normal gait Psychological: alert and cooperative; normal mood and affect    Allergies  Allergen Reactions   Other     Other reaction(s): Respiratory Distress Other reaction(s): Respiratory Distress Other reaction(s): Eye Redness   Tomato Anaphylaxis   Grass Pollen(K-O-R-T-Swt Vern)     Other reaction(s): Other    Past Medical History:  Diagnosis Date   Allergy-induced asthma    no current med.   Complication of anesthesia    states is hard to get to sleep and hard to wake up   History of CVA (cerebrovascular accident) 2004   states no deficits   Innocent heart murmur    per pt.   Medial meniscus tear 08/2016   left knee   Seasonal allergies    Social History   Socioeconomic History   Marital status: Widowed    Spouse name: Not on file   Number of children: Not on file   Years of education: Not on file   Highest education level: Not on file  Occupational History   Not on file  Tobacco Use   Smoking status: Never   Smokeless tobacco: Never  Vaping Use   Vaping Use: Never used  Substance and Sexual Activity   Alcohol use: Yes    Alcohol/week: 0.0 standard drinks of alcohol    Comment: occasionally   Drug use: No   Sexual activity: Not on file  Other Topics Concern   Not on file  Social History Narrative   Not on file  Social Determinants of Health   Financial Resource Strain: Not on file  Food Insecurity: Not on file  Transportation Needs: Not on file  Physical Activity: Not on file  Stress: Not on file  Social Connections: Not on file  Intimate Partner Violence: Not on file   Family History  Problem Relation Age of Onset   Hypertension Mother    Diabetes Mother    Hyperlipidemia Mother    Heart disease Mother    Pancreatic cancer Father    Diabetes Sister    Hypertension Sister    Hyperlipidemia Sister    Past Surgical History:  Procedure Laterality Date   ABDOMINAL  HYSTERECTOMY     CARDIAC CATHETERIZATION N/A 11/28/2015   Procedure: Left Heart Cath and Coronary Angiography;  Surgeon: Yates Decamp, MD;  Location[facility] INVASIVE CV LAB;  Service: Cardiovascular;  Laterality: N/A;   CYSTOSCOPY  09/29/2007   KNEE ARTHROSCOPY Right 05/2016   possibly 2019, Dr. Turner Daniels   KNEE ARTHROSCOPY Left 1994   KNEE ARTHROSCOPY Left 09/05/2016   Procedure: ARTHROSCOPY KNEE WITH PLICAECTOMY;  Surgeon: Gean Birchwood, MD;  Location: [facility];  Service: Orthopedics;  Laterality: Left;   KNEE ARTHROSCOPY Left 03/09/2022   w/ meniscal removal, Dr. Turner Daniels   LAPAROSCOPIC HYSTERECTOMY  09/29/2007   partial   TUBAL LIGATION     x 2      Kathryn Layman, MD 05/19/22 1007

## 2022-05-23 DIAGNOSIS — Z713 Dietary counseling and surveillance: Secondary | ICD-10-CM | POA: Diagnosis not present

## 2022-05-28 ENCOUNTER — Encounter: Payer: Self-pay | Admitting: Internal Medicine

## 2022-06-13 ENCOUNTER — Encounter: Payer: Self-pay | Admitting: Podiatry

## 2022-06-14 DIAGNOSIS — F330 Major depressive disorder, recurrent, mild: Secondary | ICD-10-CM | POA: Diagnosis not present

## 2022-06-18 DIAGNOSIS — Z713 Dietary counseling and surveillance: Secondary | ICD-10-CM | POA: Diagnosis not present

## 2022-06-20 DIAGNOSIS — R42 Dizziness and giddiness: Secondary | ICD-10-CM | POA: Diagnosis not present

## 2022-06-20 DIAGNOSIS — J301 Allergic rhinitis due to pollen: Secondary | ICD-10-CM | POA: Diagnosis not present

## 2022-07-16 ENCOUNTER — Encounter: Payer: Self-pay | Admitting: Podiatry

## 2022-07-16 ENCOUNTER — Ambulatory Visit: Payer: Federal, State, Local not specified - PPO | Admitting: Podiatry

## 2022-07-16 DIAGNOSIS — M722 Plantar fascial fibromatosis: Secondary | ICD-10-CM

## 2022-07-16 NOTE — Progress Notes (Signed)
She presents today for follow-up of her Planter fasciitis she states that she thinks is coming back in May need a new pair of orthotics.  Objective: Vital signs are stable tellurian x3 pulses are strong and palpable.  Neurologic sensorium is intact deep tendon reflexes are intact muscle strength is normal and symmetrical.  She has some tenderness on palpation central band of the plantar fascia at the calcaneal insertion site right heel.  Left heel demonstrates medial band Planter fasciitis.  Assessment: Planter fasciitis bilaterally.  Plan: At this point were going to get a new set of orthotics made and I will follow-up with her when she gets back from [place].

## 2022-07-25 DIAGNOSIS — F330 Major depressive disorder, recurrent, mild: Secondary | ICD-10-CM | POA: Diagnosis not present

## 2022-08-09 DIAGNOSIS — R0683 Snoring: Secondary | ICD-10-CM | POA: Diagnosis not present

## 2022-08-09 DIAGNOSIS — H6981 Other specified disorders of Eustachian tube, right ear: Secondary | ICD-10-CM | POA: Diagnosis not present

## 2022-08-09 DIAGNOSIS — J301 Allergic rhinitis due to pollen: Secondary | ICD-10-CM | POA: Diagnosis not present

## 2022-08-16 ENCOUNTER — Ambulatory Visit (INDEPENDENT_AMBULATORY_CARE_PROVIDER_SITE_OTHER): Payer: Federal, State, Local not specified - PPO | Admitting: Podiatry

## 2022-08-16 ENCOUNTER — Telehealth: Payer: Self-pay | Admitting: Podiatry

## 2022-08-16 DIAGNOSIS — M2041 Other hammer toe(s) (acquired), right foot: Secondary | ICD-10-CM

## 2022-08-16 DIAGNOSIS — M722 Plantar fascial fibromatosis: Secondary | ICD-10-CM

## 2022-08-16 DIAGNOSIS — M21621 Bunionette of right foot: Secondary | ICD-10-CM

## 2022-08-16 NOTE — Telephone Encounter (Signed)
Balance 245

## 2022-08-16 NOTE — Telephone Encounter (Signed)
Left message on vm for patient to call back 2nd pair of orthotics are ready for pick up.    Balance

## 2022-08-29 ENCOUNTER — Encounter: Payer: Self-pay | Admitting: Internal Medicine

## 2022-08-29 ENCOUNTER — Ambulatory Visit: Payer: Self-pay | Admitting: Internal Medicine

## 2022-08-29 ENCOUNTER — Other Ambulatory Visit: Payer: Self-pay

## 2022-08-29 ENCOUNTER — Ambulatory Visit: Payer: Federal, State, Local not specified - PPO | Admitting: Internal Medicine

## 2022-08-29 VITALS — BP 110/82 | HR 100 | Temp 100.3°F | Ht 67.0 in | Wt 156.6 lb

## 2022-08-29 DIAGNOSIS — R509 Fever, unspecified: Secondary | ICD-10-CM

## 2022-08-29 DIAGNOSIS — R051 Acute cough: Secondary | ICD-10-CM

## 2022-08-29 DIAGNOSIS — R52 Pain, unspecified: Secondary | ICD-10-CM

## 2022-08-29 DIAGNOSIS — U071 COVID-19: Secondary | ICD-10-CM

## 2022-08-29 DIAGNOSIS — R519 Headache, unspecified: Secondary | ICD-10-CM

## 2022-08-29 LAB — POC COVID19 BINAXNOW: SARS Coronavirus 2 Ag: POSITIVE — AB

## 2022-08-29 MED ORDER — ALBUTEROL SULFATE HFA 108 (90 BASE) MCG/ACT IN AERS: 2.0000 | INHALATION_SPRAY | Freq: Four times a day (QID) | RESPIRATORY_TRACT | 2 refills | Status: AC | PRN

## 2022-08-29 MED ORDER — NIRMATRELVIR/RITONAVIR (PAXLOVID)TABLET: 3.0000 | ORAL_TABLET | Freq: Two times a day (BID) | ORAL | 0 refills | Status: DC

## 2022-08-29 MED ORDER — NIRMATRELVIR/RITONAVIR (PAXLOVID)TABLET: 3.0000 | ORAL_TABLET | Freq: Two times a day (BID) | ORAL | 0 refills | Status: AC

## 2022-08-29 NOTE — Progress Notes (Signed)
I,[name],acting as a scribe for Kathryn Greenland, MD.,have documented all relevant documentation on the behalf of Kathryn Greenland, MD,as directed by  Kathryn Greenland, MD while in the presence of Kathryn Greenland, MD.    Subjective:     Patient ID: [name] , female    DOB: [birth year] , 57 y.o.   MRN: [id number]   Chief Complaint  Patient presents with   URI    HPI  Pt presents today with flu like symptoms. She states her sx started last Saturday. She recently returned from a trip to [place]. As far as she knows, no one else in her group is sick. On Saturday, she started having chills, diarrhea, no appetite, vomiting, lingering cough & body aches. She has also been running a fever on & off. Highest temp has been 100.4. She has taken Tylenol without any relief of her sx.    Monday, she was able to drink Gatorade, drink soup & crackers. She took a COVID test on Sunday which was negative.       Past Medical History:  Diagnosis Date   Allergy-induced asthma    no current med.   Complication of anesthesia    states is hard to get to sleep and hard to wake up   History of CVA (cerebrovascular accident) 2004   states no deficits   Innocent heart murmur    per pt.   Medial meniscus tear 08/2016   left knee   Seasonal allergies      Family History  Problem Relation Age of Onset   Hypertension Mother    Diabetes Mother    Hyperlipidemia Mother    Heart disease Mother    Pancreatic cancer Father    Diabetes Sister    Hypertension Sister    Hyperlipidemia Sister      Current Outpatient Medications:    albuterol (VENTOLIN HFA) 108 (90 Base) MCG/ACT inhaler, Inhale 2 puffs into the lungs every 6 (six) hours as needed for wheezing or shortness of breath., Disp: 8 g, Rfl: 2   amoxicillin (AMOXIL) 500 MG tablet, Take 4 tablets one hour prior to dental appt prn, Disp: 4 tablet, Rfl: 2   Calcium-Vitamin D-Vitamin K [id number] MG-UNT-MCG TABS, Take 1 tablet by  mouth daily., Disp: , Rfl:    cetirizine (ZYRTEC) 10 MG tablet, Take 10 mg by mouth daily., Disp: , Rfl:    clobetasol ointment (TEMOVATE) 0.05 %, Apply topically 3 (three) times a week., Disp: , Rfl:    estradiol (VIVELLE-DOT) 0.05 MG/24HR patch, Place 1 patch onto the skin 2 (two) times a week., Disp: , Rfl:    progesterone (PROMETRIUM) 200 MG capsule, Take 200 mg by mouth daily., Disp: , Rfl:    rosuvastatin (CRESTOR) 10 MG tablet, TAKE 1 TABLET BY MOUTH EVERYDAY AT BEDTIME, Disp: 90 tablet, Rfl: 2   RYALTRIS 665-25 MCG/ACT SUSP, SMARTSIG:1 Spray(s) Both Nares 1-2 Times Daily, Disp: , Rfl:    SODIUM FLUORIDE, DENTAL RINSE, 0.2 % SOLN, Take by mouth as directed., Disp: , Rfl:    XIIDRA 5 % SOLN, Apply 1 drop to eye 2 (two) times daily. , Disp: , Rfl: 3   furosemide (LASIX) 20 MG tablet, Take 1-2 tablets in the morning as needed for leg swelling., Disp: 14 tablet, Rfl: 0   nirmatrelvir/ritonavir (PAXLOVID) 20 x 150 MG & 10 x 100MG  TABS, Take 3 tablets by mouth 2 (two) times daily for 5 days. Patient GFR is >60. Use as  directed x 5 days, Disp: 30 tablet, Rfl: 0   Allergies  Allergen Reactions   Other     Other reaction(s): Respiratory Distress Other reaction(s): Respiratory Distress Other reaction(s): Eye Redness   Tomato Anaphylaxis   Grass Pollen(K-O-R-T-Swt Vern)     Other reaction(s): Other     Review of Systems  Constitutional:  Positive for chills and fever.  Respiratory:  Positive for cough.   Cardiovascular: Negative.   Neurological: Negative.   Psychiatric/Behavioral: Negative.       Today's Vitals   08/29/22 1138  BP: 110/82  Pulse: 100  Temp: 100.3 F (37.9 C)  SpO2: 96%  Weight: 156 lb 9.6 oz (71 kg)  Height: 5\' 7"  (1.702 m)   Body mass index is 24.53 kg/m.  Wt Readings from Last 3 Encounters:  08/29/22 156 lb 9.6 oz (71 kg)  05/03/22 168 lb 3.2 oz (76.3 kg)  01/29/22 166 lb 9.6 oz (75.6 kg)    Objective:  Physical Exam Vitals and nursing note reviewed.   Constitutional:      Appearance: Normal appearance. She is ill-appearing.  HENT:     Head: Normocephalic and atraumatic.     Right Ear: Tympanic membrane, ear canal and external ear normal. There is no impacted cerumen.     Left Ear: Tympanic membrane, ear canal and external ear normal. There is no impacted cerumen.     Nose:     Comments: Masked     Mouth/Throat:     Comments: Masked  Eyes:     Extraocular Movements: Extraocular movements intact.  Cardiovascular:     Rate and Rhythm: Normal rate and regular rhythm.     Heart sounds: Normal heart sounds.  Pulmonary:     Effort: Pulmonary effort is normal.     Breath sounds: Normal breath sounds.     Comments: Coughs frequently during exam Musculoskeletal:     Cervical back: Normal range of motion.  Skin:    General: Skin is warm.  Neurological:     General: No focal deficit present.     Mental Status: She is alert.  Psychiatric:        Mood and Affect: Mood normal.        Behavior: Behavior normal.      Assessment And Plan:     1. Acute cough Comments: Rapid COVID test is POSITIVE. I will send rx albuterol MDI prn. - POC COVID-19 BinaxNow  2. COVID-19 Comments: I will send rx Paxlovid, advised to STOP statin therapy for ONE WEEK.  Advised to go to ER if sx persist/worsen.  Advised patient to take Vitamin C, D, Zinc.  Keep yourself hydrated with a lot of water and rest. Take Delsym for cough and Mucinex as needed. Take Tylenol or pain reliever every 4-6 hours as needed for pain/fever/body ache. If you have elevated blood pressure, you can take OTC Coricidin. You can also take OTC Oscillococcinum, a homeopathic remedy,  to help with your symptoms.  Educated patient that if symptoms get worse or if he/she experiences any SOB, chest pain or pain in their legs to seek immediate emergency care. Continue to monitor your oxygen levels. Call office ASAP if you have any questions. Quarantine for 5 days if tested positive and no symptoms  or 10 days if tested positive and you are with symptoms. Wear a mask around other people.    Patient was given opportunity to ask questions. Patient verbalized understanding of the plan and was able to repeat  key elements of the plan. All questions were answered to their satisfaction.   I, Kathryn Greenland, MD, have reviewed all documentation for this visit. The documentation on 08/29/22 for the exam, diagnosis, procedures, and orders are all accurate and complete.   IF YOU HAVE BEEN REFERRED TO A SPECIALIST, IT MAY TAKE 1-2 WEEKS TO SCHEDULE/PROCESS THE REFERRAL. IF YOU HAVE NOT HEARD FROM US/SPECIALIST IN TWO WEEKS, PLEASE GIVE [place] A CALL AT [phone].   THE PATIENT IS ENCOURAGED TO PRACTICE SOCIAL DISTANCING DUE TO THE COVID-19 PANDEMIC.

## 2022-08-29 NOTE — Patient Instructions (Addendum)
COVID-19 COVID-19, or coronavirus disease 2019, is an infection that is caused by a new (novel) coronavirus called SARS-CoV-2. COVID-19 can cause many symptoms. In some people, the virus may not cause any symptoms. In others, it may cause mild or severe symptoms. Some people with severe infection develop severe disease. What are the causes? This illness is caused by a virus. The virus may be in the air as tiny specks of fluid (aerosols) or droplets, or it may be on surfaces. You may catch the virus by: Breathing in droplets from an infected person. Droplets can be spread by a person breathing, speaking, singing, coughing, or sneezing. Touching something, like a table or a doorknob, that has virus on it (is contaminated) and then touching your mouth, nose, or eyes. What increases the risk? Risk for infection: You are more likely to get infected with the COVID-19 virus if: You are within 6 ft (1.8 m) of a person with COVID-19 for 15 minutes or longer. You are providing care for a person who is infected with COVID-19. You are in close personal contact with other people. Close personal contact includes hugging, kissing, or sharing eating or drinking utensils. Risk for serious illness caused by COVID-19: You are more likely to get seriously ill from the COVID-19 virus if: You have cancer. You have a long-term (chronic) disease, such as: Chronic lung disease. This includes pulmonary embolism, chronic obstructive pulmonary disease, and cystic fibrosis. Long-term disease that lowers your body's ability to fight infection (immunocompromise). Serious cardiac conditions, such as heart failure, coronary artery disease, or cardiomyopathy. Diabetes. Chronic kidney disease. Liver diseases. These include cirrhosis, nonalcoholic fatty liver disease, alcoholic liver disease, or autoimmune hepatitis. You have obesity. You are pregnant or were recently pregnant. You have sickle cell disease. What are the signs  or symptoms? Symptoms of this condition can range from mild to severe. Symptoms may appear any time from 2 to 14 days after being exposed to the virus. They include: Fever or chills. Shortness of breath or trouble breathing. Feeling tired or very tired. Headaches, body aches, or muscle aches. Runny or stuffy nose, sneezing, coughing, or sore throat. New loss of taste or smell. This is rare. Some people may also have stomach problems, such as nausea, vomiting, or diarrhea. Other people may not have any symptoms of COVID-19. How is this diagnosed? This condition may be diagnosed by testing samples to check for the COVID-19 virus. The most common tests are the PCR test and the antigen test. Tests may be done in the lab or at home. They include: Using a swab to take a sample of fluid from the back of your nose and throat (nasopharyngeal fluid), from your nose, or from your throat. Testing a sample of saliva from your mouth. Testing a sample of coughed-up mucus from your lungs (sputum). How is this treated? Treatment for COVID-19 infection depends on the severity of the condition. Mild symptoms can be managed at home with rest, fluids, and over-the-counter medicines. Serious symptoms may be treated in a hospital intensive care unit (ICU). Treatment in the ICU may include: Supplemental oxygen. Extra oxygen is given through a tube in the nose, a face mask, or a hood. Medicines. These may include: Antivirals, such as monoclonal antibodies. These help your body fight off certain viruses that can cause disease. Anti-inflammatories, such as corticosteroids. These reduce inflammation and suppress the immune system. Antithrombotics. These prevent or treat blood clots, if they develop. Convalescent plasma. This helps boost your immune system, if   you have an underlying immunosuppressive condition or are getting immunosuppressive treatments. Prone positioning. This means you will lie on your stomach. This  helps oxygen to get into your lungs. Infection control measures. If you are at risk for more serious illness caused by COVID-19, your health care provider may prescribe two long-acting monoclonal antibodies, given together every 6 months. How is this prevented? To protect yourself: Use preventive medicine (pre-exposure prophylaxis). You may get pre-exposure prophylaxis if you have moderate or severe immunocompromise. Get vaccinated. Anyone 6 months old or older who meets guidelines can get a COVID-19 vaccine or vaccine series. This includes people who are pregnant or making breast milk (lactating). Get an added dose of COVID-19 vaccine after your first vaccine or vaccine series if you have moderate to severe immunocompromise. This applies if you have had a solid organ transplant or have been diagnosed with an immunocompromising condition. You should get the added dose 4 weeks after you got the first COVID-19 vaccine or vaccine series. If you get an mRNA vaccine, you will need a 3-dose primary series. If you get the J&J/Janssen vaccine, you will need a 2-dose primary series, with the second dose being an mRNA vaccine. Talk to your health care provider about getting experimental monoclonal antibodies. This treatment is approved under emergency use authorization to prevent severe illness before or after being exposed to the COVID-19 virus. You may be given monoclonal antibodies if: You have moderate or severe immunocompromise. This includes treatments that lower your immune response. People with immunocompromise may not develop protection against COVID-19 when they are vaccinated. You cannot be vaccinated. You may not get a vaccine if you have a severe allergic reaction to the vaccine or its components. You are not fully vaccinated. You are in a facility where COVID-19 is present and: Are in close contact with a person who is infected with the COVID-19 virus. Are at high risk of being exposed to the  COVID-19 virus. You are at risk of illness from new variants of the COVID-19 virus. To protect others: If you have symptoms of COVID-19, take steps to prevent the virus from spreading to others. Stay home. Leave your house only to get medical care. Do not use public transit, if possible. Do not travel while you are sick. Wash your hands often with soap and water for at least 20 seconds. If soap and water are not available, use alcohol-based hand sanitizer. Make sure that all people in your household wash their hands well and often. Cough or sneeze into a tissue or your sleeve or elbow. Do not cough or sneeze into your hand or into the air. Where to find more information Centers for Disease Control and Prevention: www.cdc.gov/coronavirus World Health Organization: www.who.int/health-topics/coronavirus Get help right away if: You have trouble breathing. You have pain or pressure in your chest. You are confused. You have bluish lips and fingernails. You have trouble waking from sleep. You have symptoms that get worse. These symptoms may be an emergency. Get help right away. Call 911. Do not wait to see if the symptoms will go away. Do not drive yourself to the hospital. Summary COVID-19 is an infection that is caused by a new coronavirus. Sometimes, there are no symptoms. Other times, symptoms range from mild to severe. Some people with a severe COVID-19 infection develop severe disease. The virus that causes COVID-19 can spread from person to person through droplets or aerosols from breathing, speaking, singing, coughing, or sneezing. Mild symptoms of COVID-19 can be managed   at home with rest, fluids, and over-the-counter medicines. This information is not intended to replace advice given to you by your health care provider. Make sure you discuss any questions you have with your health care provider. Document Revised: 08/01/2021 Document Reviewed: 08/03/2021 Elsevier Patient Education   [place].   Cough, Adult A cough helps to clear your throat and lungs. A cough may be a sign of an illness or another medical condition. An acute cough may only last 2-3 weeks, while a chronic cough may last 8 or more weeks. Many things can cause a cough. They include: Germs (viruses or bacteria) that attack the airway. Breathing in things that bother (irritate) your lungs. Allergies. Asthma. Mucus that runs down the back of your throat (postnasal drip). Smoking. Acid backing up from the stomach into the tube that moves food from the mouth to the stomach (gastroesophageal reflux). Some medicines. Lung problems. Other medical conditions, such as heart failure or a blood clot in the lung (pulmonary embolism). Follow these instructions at home: Medicines Take over-the-counter and prescription medicines only as told by your doctor. Talk with your doctor before you take medicines that stop a cough (cough suppressants). Lifestyle  Do not smoke, and try not to be around smoke. Do not use any products that contain nicotine or tobacco, such as cigarettes, e-cigarettes, and chewing tobacco. If you need help quitting, ask your doctor. Drink enough fluid to keep your pee (urine) pale yellow. Avoid caffeine. Do not drink alcohol if your doctor tells you not to drink. General instructions  Watch for any changes in your cough. Tell your doctor about them. Always cover your mouth when you cough. Stay away from things that make you cough, such as perfume, candles, campfire smoke, or cleaning products. If the air is dry, use a cool mist vaporizer or humidifier in your home. If your cough is worse at night, try using extra pillows to raise your head up higher while you sleep. Rest as needed. Keep all follow-up visits as told by your doctor. This is important. Contact a doctor if: You have new symptoms. You cough up pus. Your cough does not get better after 2-3 weeks, or your cough gets  worse. Cough medicine does not help your cough and you are not sleeping well. You have pain that gets worse or pain that is not helped with medicine. You have a fever. You are losing weight and you do not know why. You have night sweats. Get help right away if: You cough up blood. You have trouble breathing. Your heartbeat is very fast. These symptoms may be an emergency. Do not wait to see if the symptoms will go away. Get medical help right away. Call your local emergency services (911 in the [place] Do not drive yourself to the hospital. Summary A cough helps to clear your throat and lungs. Many things can cause a cough. Take over-the-counter and prescription medicines only as told by your doctor. Always cover your mouth when you cough. Contact a doctor if you have new symptoms or you have a cough that does not get better or gets worse. This information is not intended to replace advice given to you by your health care provider. Make sure you discuss any questions you have with your health care provider. Document Revised: 10/02/2019 Document Reviewed: 09/01/2018 Elsevier Patient Education  [place].

## 2022-09-05 DIAGNOSIS — F330 Major depressive disorder, recurrent, mild: Secondary | ICD-10-CM | POA: Diagnosis not present

## 2022-09-06 ENCOUNTER — Encounter: Payer: Federal, State, Local not specified - PPO | Admitting: Internal Medicine

## 2022-09-12 DIAGNOSIS — G4733 Obstructive sleep apnea (adult) (pediatric): Secondary | ICD-10-CM | POA: Diagnosis not present

## 2022-09-17 ENCOUNTER — Ambulatory Visit (INDEPENDENT_AMBULATORY_CARE_PROVIDER_SITE_OTHER): Payer: Federal, State, Local not specified - PPO | Admitting: Internal Medicine

## 2022-09-17 ENCOUNTER — Encounter: Payer: Self-pay | Admitting: Internal Medicine

## 2022-09-17 VITALS — BP 120/80 | HR 78 | Temp 98.3°F | Ht 67.2 in | Wt 160.8 lb

## 2022-09-17 DIAGNOSIS — Z6825 Body mass index (BMI) 25.0-25.9, adult: Secondary | ICD-10-CM

## 2022-09-17 DIAGNOSIS — Z0000 Encounter for general adult medical examination without abnormal findings: Secondary | ICD-10-CM | POA: Diagnosis not present

## 2022-09-17 DIAGNOSIS — E559 Vitamin D deficiency, unspecified: Secondary | ICD-10-CM

## 2022-09-17 DIAGNOSIS — Z0001 Encounter for general adult medical examination with abnormal findings: Secondary | ICD-10-CM | POA: Diagnosis not present

## 2022-09-17 DIAGNOSIS — H40023 Open angle with borderline findings, high risk, bilateral: Secondary | ICD-10-CM | POA: Diagnosis not present

## 2022-09-17 DIAGNOSIS — R7303 Prediabetes: Secondary | ICD-10-CM

## 2022-09-17 DIAGNOSIS — H04123 Dry eye syndrome of bilateral lacrimal glands: Secondary | ICD-10-CM | POA: Diagnosis not present

## 2022-09-17 NOTE — Progress Notes (Signed)
Rich Brave Llittleton,acting as a Education administrator for Maximino Greenland, MD.,have documented all relevant documentation on the behalf of Maximino Greenland, MD,as directed by  Maximino Greenland, MD while in the presence of Maximino Greenland, MD.   Subjective:     Patient ID: [name] , female    DOB: 05/04/1966 , 57 y.o.   MRN: [id number]   Chief Complaint  Patient presents with   Annual Exam    HPI  She is here today for a full physical examination. She is followed by Dr. Charlesetta Garibaldi for her GYN exams. She is up to date with her pelvic exams. She states she had sleep study performed, requested by her ENT. She was called over the weekend stating she needs CPAP.      Past Medical History:  Diagnosis Date   Allergy-induced asthma    no current med.   Complication of anesthesia    states is hard to get to sleep and hard to wake up   History of CVA (cerebrovascular accident) 2004   states no deficits   Innocent heart murmur    per pt.   Medial meniscus tear 08/2016   left knee   Seasonal allergies      Family History  Problem Relation Age of Onset   Hypertension Mother    Diabetes Mother    Hyperlipidemia Mother    Heart disease Mother    Pancreatic cancer Father    Diabetes Sister    Hypertension Sister    Hyperlipidemia Sister      Current Outpatient Medications:    albuterol (VENTOLIN HFA) 108 (90 Base) MCG/ACT inhaler, Inhale 2 puffs into the lungs every 6 (six) hours as needed for wheezing or shortness of breath., Disp: 8 g, Rfl: 2   amoxicillin (AMOXIL) 500 MG tablet, Take 4 tablets one hour prior to dental appt prn, Disp: 4 tablet, Rfl: 2   Calcium-Vitamin D-Vitamin K [id number] MG-UNT-MCG TABS, Take 1 tablet by mouth daily., Disp: , Rfl:    cetirizine (ZYRTEC) 10 MG tablet, Take 10 mg by mouth daily., Disp: , Rfl:    clobetasol ointment (TEMOVATE) 0.05 %, Apply topically 3 (three) times a week., Disp: , Rfl:    rosuvastatin (CRESTOR) 10 MG tablet, TAKE 1 TABLET BY MOUTH  EVERYDAY AT BEDTIME, Disp: 90 tablet, Rfl: 2   RYALTRIS 665-25 MCG/ACT SUSP, SMARTSIG:1 Spray(s) Both Nares 1-2 Times Daily, Disp: , Rfl:    SODIUM FLUORIDE, DENTAL RINSE, 0.2 % SOLN, Take by mouth as directed., Disp: , Rfl:    XIIDRA 5 % SOLN, Apply 1 drop to eye 2 (two) times daily. , Disp: , Rfl: 3   Allergies  Allergen Reactions   Other     Other reaction(s): Respiratory Distress Other reaction(s): Respiratory Distress Other reaction(s): Eye Redness   Tomato Anaphylaxis   Grass Pollen(K-O-R-T-Swt Vern)     Other reaction(s): Other      The patient states she uses status post hysterectomy for birth control. Last LMP was No LMP recorded. Patient has had a hysterectomy.. Negative for Dysmenorrhea. Negative for: breast discharge, breast lump(s), breast pain and breast self exam. Associated symptoms include abnormal vaginal bleeding. Pertinent negatives include abnormal bleeding (hematology), anxiety, decreased libido, depression, difficulty falling sleep, dyspareunia, history of infertility, nocturia, sexual dysfunction, sleep disturbances, urinary incontinence, urinary urgency, vaginal discharge and vaginal itching. Diet regular.The patient states her exercise level is  moderate.   . The patient's tobacco use is:  Social History   Tobacco  Use  Smoking Status Never  Smokeless Tobacco Never  . She has been exposed to passive smoke. The patient's alcohol use is:  Social History   Substance and Sexual Activity  Alcohol Use Yes   Alcohol/week: 0.0 standard drinks of alcohol   Comment: occasionally    Review of Systems  Constitutional: Negative.   HENT: Negative.    Eyes: Negative.   Respiratory: Negative.    Cardiovascular: Negative.   Gastrointestinal: Negative.   Endocrine: Negative.   Genitourinary: Negative.   Musculoskeletal: Negative.   Skin: Negative.   Allergic/Immunologic: Negative.   Neurological: Negative.   Hematological: Negative.   Psychiatric/Behavioral:  Negative.       Today's Vitals   09/17/22 0858  BP: 120/80  Pulse: 78  Temp: 98.3 F (36.8 C)  Weight: 160 lb 12.8 oz (72.9 kg)  Height: 5' 7.2" (1.707 m)  PainSc: 0-No pain   Body mass index is 25.04 kg/m.  Wt Readings from Last 3 Encounters:  09/17/22 160 lb 12.8 oz (72.9 kg)  08/29/22 156 lb 9.6 oz (71 kg)  05/03/22 168 lb 3.2 oz (76.3 kg)     Objective:  Physical Exam Vitals and nursing note reviewed.  Constitutional:      Appearance: Normal appearance.  HENT:     Head: Normocephalic and atraumatic.     Right Ear: Tympanic membrane, ear canal and external ear normal.     Left Ear: Tympanic membrane, ear canal and external ear normal.     Nose:     Comments: Masked     Mouth/Throat:     Comments: Masked Eyes:     Extraocular Movements: Extraocular movements intact.     Conjunctiva/sclera: Conjunctivae normal.     Pupils: Pupils are equal, round, and reactive to light.  Cardiovascular:     Rate and Rhythm: Normal rate and regular rhythm.     Pulses: Normal pulses.     Heart sounds: Normal heart sounds.  Pulmonary:     Effort: Pulmonary effort is normal.     Breath sounds: Normal breath sounds.  Chest:  Breasts:    Tanner Score is 5.     Right: Normal.     Left: Normal.  Abdominal:     General: Abdomen is flat. Bowel sounds are normal.     Palpations: Abdomen is soft.  Genitourinary:    Comments: deferred Musculoskeletal:        General: Normal range of motion.     Cervical back: Normal range of motion and neck supple.  Skin:    General: Skin is warm and dry.  Neurological:     General: No focal deficit present.     Mental Status: She is alert and oriented to person, place, and time.  Psychiatric:        Mood and Affect: Mood normal.        Behavior: Behavior normal.      Assessment And Plan:     1. Encounter for general adult medical examination w/o abnormal findings Comments: A full exam was performed. Importance of monthly self breast exams  was discussed with the patient.  PATIENT IS ADVISED TO GET 30-45 MINUTES REGULAR EXERCISE NO LESS THAN FOUR TO FIVE DAYS PER WEEK - BOTH WEIGHTBEARING EXERCISES AND AEROBIC ARE RECOMMENDED.  PATIENT IS ADVISED TO FOLLOW A HEALTHY DIET WITH AT LEAST SIX FRUITS/VEGGIES PER DAY, DECREASE INTAKE OF RED MEAT, AND TO INCREASE FISH INTAKE TO TWO DAYS PER WEEK.  MEATS/FISH SHOULD NOT BE FRIED,  BAKED OR BROILED IS PREFERABLE.  IT IS ALSO IMPORTANT TO CUT BACK ON YOUR SUGAR INTAKE. PLEASE AVOID ANYTHING WITH ADDED SUGAR, CORN SYRUP OR OTHER SWEETENERS. IF YOU MUST USE A SWEETENER, YOU CAN TRY STEVIA. IT IS ALSO IMPORTANT TO AVOID ARTIFICIALLY SWEETENERS AND DIET BEVERAGES. LASTLY, I SUGGEST WEARING SPF 50 SUNSCREEN ON EXPOSED PARTS AND ESPECIALLY WHEN IN THE DIRECT SUNLIGHT FOR AN EXTENDED PERIOD OF TIME.  PLEASE AVOID FAST FOOD RESTAURANTS AND INCREASE YOUR WATER INTAKE. - CBC - CMP14+EGFR - Lipid panel - Hemoglobin A1c - TSH  2. Vitamin D deficiency disease Comments: I will check a vitamin D level and supplement as needed. - Vitamin D (25 hydroxy)  3. BMI 25.0-25.9,adult Comments: She is encouraged to aim for at least 150 minutes of exercise per week.  Patient was given opportunity to ask questions. Patient verbalized understanding of the plan and was able to repeat key elements of the plan. All questions were answered to their satisfaction.   I, [name], MD, have reviewed all documentation for this visit. The documentation on 09/17/22 for the exam, diagnosis, procedures, and orders are all accurate and complete.   THE PATIENT IS ENCOURAGED TO PRACTICE SOCIAL DISTANCING DUE TO THE COVID-19 PANDEMIC.

## 2022-09-17 NOTE — Patient Instructions (Signed)

## 2022-09-18 LAB — CBC
Hematocrit: 39.3 % (ref 34.0–46.6)
Hemoglobin: 12.4 g/dL (ref 11.1–15.9)
MCH: 27.3 pg (ref 26.6–33.0)
MCHC: 31.6 g/dL (ref 31.5–35.7)
MCV: 86 fL (ref 79–97)
Platelets: 496 10*3/uL — ABNORMAL HIGH (ref 150–450)
RBC: 4.55 x10E6/uL (ref 3.77–5.28)
RDW: 14.8 % (ref 11.7–15.4)
WBC: 7.2 10*3/uL (ref 3.4–10.8)

## 2022-09-18 LAB — CMP14+EGFR
ALT: 14 IU/L (ref 0–32)
AST: 14 IU/L (ref 0–40)
Albumin/Globulin Ratio: 1.3 (ref 1.2–2.2)
Albumin: 4.3 g/dL (ref 3.8–4.9)
Alkaline Phosphatase: 92 IU/L (ref 44–121)
BUN/Creatinine Ratio: 11 (ref 9–23)
BUN: 10 mg/dL (ref 6–24)
Bilirubin Total: 0.2 mg/dL (ref 0.0–1.2)
CO2: 22 mmol/L (ref 20–29)
Calcium: 9.6 mg/dL (ref 8.7–10.2)
Chloride: 103 mmol/L (ref 96–106)
Creatinine, Ser: 0.89 mg/dL (ref 0.57–1.00)
Globulin, Total: 3.4 g/dL (ref 1.5–4.5)
Glucose: 83 mg/dL (ref 70–99)
Potassium: 4.5 mmol/L (ref 3.5–5.2)
Sodium: 144 mmol/L (ref 134–144)
Total Protein: 7.7 g/dL (ref 6.0–8.5)
eGFR: 76 mL/min/{1.73_m2} (ref >59)

## 2022-09-18 LAB — HEMOGLOBIN A1C
Est. average glucose Bld gHb Est-mCnc: 128 mg/dL
Hgb A1c MFr Bld: 6.1 % — ABNORMAL HIGH (ref 4.8–5.6)

## 2022-09-18 LAB — LIPID PANEL
Chol/HDL Ratio: 2.8 ratio (ref 0.0–4.4)
Cholesterol, Total: 168 mg/dL (ref 100–199)
HDL: 61 mg/dL (ref >39)
LDL Chol Calc (NIH): 92 mg/dL (ref 0–99)
Triglycerides: 78 mg/dL (ref 0–149)
VLDL Cholesterol Cal: 15 mg/dL (ref 5–40)

## 2022-09-18 LAB — TSH: TSH: 1.29 u[IU]/mL (ref 0.450–4.500)

## 2022-09-18 LAB — VITAMIN D 25 HYDROXY (VIT D DEFICIENCY, FRACTURES): Vit D, 25-Hydroxy: 37.6 ng/mL (ref 30.0–100.0)

## 2022-09-23 ENCOUNTER — Other Ambulatory Visit: Payer: Self-pay | Admitting: Internal Medicine

## 2022-09-24 DIAGNOSIS — Z01419 Encounter for gynecological examination (general) (routine) without abnormal findings: Secondary | ICD-10-CM | POA: Diagnosis not present

## 2022-09-24 DIAGNOSIS — Z6825 Body mass index (BMI) 25.0-25.9, adult: Secondary | ICD-10-CM | POA: Diagnosis not present

## 2022-09-24 DIAGNOSIS — Z90711 Acquired absence of uterus with remaining cervical stump: Secondary | ICD-10-CM | POA: Diagnosis not present

## 2022-09-26 DIAGNOSIS — G4733 Obstructive sleep apnea (adult) (pediatric): Secondary | ICD-10-CM | POA: Diagnosis not present

## 2022-10-10 DIAGNOSIS — F330 Major depressive disorder, recurrent, mild: Secondary | ICD-10-CM | POA: Diagnosis not present

## 2022-10-26 DIAGNOSIS — G4733 Obstructive sleep apnea (adult) (pediatric): Secondary | ICD-10-CM | POA: Diagnosis not present

## 2022-10-31 DIAGNOSIS — F330 Major depressive disorder, recurrent, mild: Secondary | ICD-10-CM | POA: Diagnosis not present

## 2022-11-14 DIAGNOSIS — F330 Major depressive disorder, recurrent, mild: Secondary | ICD-10-CM | POA: Diagnosis not present

## 2022-11-16 DIAGNOSIS — J301 Allergic rhinitis due to pollen: Secondary | ICD-10-CM | POA: Diagnosis not present

## 2022-11-16 DIAGNOSIS — G4733 Obstructive sleep apnea (adult) (pediatric): Secondary | ICD-10-CM | POA: Diagnosis not present

## 2022-11-26 DIAGNOSIS — G4733 Obstructive sleep apnea (adult) (pediatric): Secondary | ICD-10-CM | POA: Diagnosis not present

## 2022-12-03 ENCOUNTER — Telehealth: Payer: Self-pay | Admitting: Cardiovascular Disease

## 2022-12-03 NOTE — Telephone Encounter (Signed)
Pt c/o swelling: STAT is pt has developed SOB within 24 hours  If swelling, where is the swelling located? Both ankles with tingling feeling in left leg. She states she has put compression socks on but they are not working.   How much weight have you gained and in what time span? Losing weight due to exercise   Have you gained 3 pounds in a day or 5 pounds in a week? No   Do you have a log of your daily weights (if so, list)? No   Are you currently taking a fluid pill? No   Are you currently SOB? No   Have you traveled recently? In [place] right now, about to get on the plane at 7:00pm

## 2022-12-03 NOTE — Telephone Encounter (Signed)
Agree with plan for compression socks and office visit.  General tips for edema below.  To prevent or reduce lower extremity swelling: Eat a low salt diet. Salt makes the body hold onto extra fluid which causes swelling. Sit with legs elevated. For example, in the recliner or on an ottoman.  Wear knee-high compression stockings during the daytime. Ones labeled 15-20 mmHg provide good compression.   Alver Sorrow, NP

## 2022-12-03 NOTE — Telephone Encounter (Signed)
Spoke with patient regarding swelling Swelling started last night  Flew Thursday to [place], 50 minute flight  Denies shortness of breath or pain in legs. Has had some tingling/prickly sensations in feet  Very busy weekend and did eat more unhealthy than normal  Started using support socks today, her PCP had suggested when she had this issues 4-6 months ago.  Concerned about swelling and has not seen Dr Duke Salvia since June 2021 Scheduled follow up with Ronn Melena NP 4/15. Aware of date, time, and location  Advised to continue support socks, increase water intake, and  if worsening s/s to call back Verbalized understanding.

## 2022-12-10 ENCOUNTER — Ambulatory Visit (HOSPITAL_BASED_OUTPATIENT_CLINIC_OR_DEPARTMENT_OTHER): Payer: Federal, State, Local not specified - PPO | Admitting: Family

## 2022-12-10 ENCOUNTER — Encounter (HOSPITAL_BASED_OUTPATIENT_CLINIC_OR_DEPARTMENT_OTHER): Payer: Self-pay | Admitting: Family

## 2022-12-10 VITALS — BP 110/72 | HR 79 | Ht 67.2 in | Wt 163.6 lb

## 2022-12-10 DIAGNOSIS — E782 Mixed hyperlipidemia: Secondary | ICD-10-CM

## 2022-12-10 DIAGNOSIS — R600 Localized edema: Secondary | ICD-10-CM

## 2022-12-10 DIAGNOSIS — G4733 Obstructive sleep apnea (adult) (pediatric): Secondary | ICD-10-CM | POA: Diagnosis not present

## 2022-12-10 DIAGNOSIS — Z8673 Personal history of transient ischemic attack (TIA), and cerebral infarction without residual deficits: Secondary | ICD-10-CM | POA: Diagnosis not present

## 2022-12-10 DIAGNOSIS — R072 Precordial pain: Secondary | ICD-10-CM

## 2022-12-10 MED ORDER — METOPROLOL TARTRATE 100 MG PO TABS: ORAL_TABLET | ORAL | 0 refills | Status: DC

## 2022-12-10 NOTE — Patient Instructions (Signed)
Medication Instructions:  Your physician recommends that you continue on your current medications as directed. Please refer to the Current Medication list given to you today.  *If you need a refill on your cardiac medications before your next appointment, please call your pharmacy*   Lab Work: Please return for Lab work 3-5 days prior to CTA- BMP. You may come to the...   [facility] (3rd floor) [address], [place], [place] [address]  Open: 8am-Noon and 1pm-4:30pm  Please ring the doorbell on the small table when you exit the elevator and the Lab Tech will come get you  [facility] [address], [place], [place] [address] Open: 8am-1pm, then 2pm-4:30pm   [facility]- Please see attached locations sheet stapled to your lab work with address and hours.   If you have labs (blood work) drawn today and your tests are completely normal, you will receive your results only by: MyChart Message (if you have MyChart) OR A paper copy in the mail If you have any lab test that is abnormal or we need to change your treatment, we will call you to review the results.   Testing/Procedures:   Your cardiac CT will be scheduled at one of the below locations:   [facility] [address] [place], [place] [address] [phone]  If scheduled at [facility], please arrive at the [facility] and Children's Entrance (Entrance C2) of [facility] 30 minutes prior to test start time. You can use the FREE valet parking offered at entrance C (encouraged to control the heart rate for the test)  Proceed to the [facility] Radiology Department (first floor) to check-in and test prep.  All radiology patients and guests should use entrance C2 at [facility], accessed from [facility], even though the hospital's physical address listed is [address]     Please follow these instructions  carefully (unless otherwise directed):  On the Night Before the Test: Be sure to Drink plenty of water. Do not consume any caffeinated/decaffeinated beverages or chocolate 12 hours prior to your test. Do not take any antihistamines 12 hours prior to your test.  On the Day of the Test: Drink plenty of water until 1 hour prior to the test. Do not eat any food 1 hour prior to test. You may take your regular medications prior to the test.  Take metoprolol (Lopressor)   two hours prior to test. FEMALES- please wear underwire-free bra if available, avoid dresses & tight clothing      After the Test: Drink plenty of water. After receiving IV contrast, you may experience a mild flushed feeling. This is normal. On occasion, you may experience a mild rash up to 24 hours after the test. This is not dangerous. If this occurs, you can take Benadryl 25 mg and increase your fluid intake. If you experience trouble breathing, this can be serious. If it is severe call 911 IMMEDIATELY. If it is mild, please call our office. If you take any of these medications: Glipizide/Metformin, Avandament, Glucavance, please do not take 48 hours after completing test unless otherwise instructed.  We will call to schedule your test 2-4 weeks out understanding that some insurance companies will need an authorization prior to the service being performed.   For non-scheduling related questions, please contact the cardiac imaging nurse navigator should you have any questions/concerns: Rockwell Alexandria, Cardiac Imaging Nurse Navigator Larey Brick, Cardiac Imaging Nurse  Navigator [facility] Direct Office Dial: [phone]   For scheduling needs, including cancellations and rescheduling, please call [place], [phone].    Follow-Up: At [facility], you and your health needs are our priority.  As part of our continuing mission to provide you with exceptional heart care, we  have created designated Provider Care Teams.  These Care Teams include your primary Cardiologist (physician) and Advanced Practice Providers (APPs -  Physician Assistants and Nurse Practitioners) who all work together to provide you with the care you need, when you need it.  We recommend signing up for the patient portal called "MyChart".  Sign up information is provided on this After Visit Summary.  MyChart is used to connect with patients for Virtual Visits (Telemedicine).  Patients are able to view lab/test results, encounter notes, upcoming appointments, etc.  Non-urgent messages can be sent to your provider as well.   To learn more about what you can do with MyChart, go to [email or web address].    Your next appointment:   1 year(s)  Provider:   Chilton Si, MD    Other Instructions To prevent or reduce lower extremity swelling: Eat a low salt diet. Salt makes the body hold onto extra fluid which causes swelling. Sit with legs elevated. For example, in the recliner or on an ottoman.  Wear knee-high compression stockings during the daytime. Ones labeled 15-20 mmHg provide good compression.  __________________________________________  Chronic Venous Insufficiency Chronic venous insufficiency is a condition where the leg veins cannot effectively pump blood from the legs to the heart. This happens when the vein walls are either stretched, weakened, or damaged, or when the valves inside the vein are damaged. With the right treatment, you should be able to continue with an active life. This condition is also called venous stasis. What are the causes? Common causes of this condition include: High blood pressure inside the veins (venous hypertension). Sitting or standing too long, causing increased blood pressure in the leg veins. A blood clot that blocks blood flow in a vein (deep vein thrombosis, DVT). Inflammation of a vein (phlebitis) that causes a blood clot to form. Tumors in the  pelvis that cause blood to back up. What increases the risk? The following factors may make you more likely to develop this condition: Having a family history of this condition. Obesity. Pregnancy. Living without enough regular physical activity or exercise (sedentary lifestyle). Smoking. Having a job that requires long periods of standing or sitting in one place. Being a certain age. Women in their 56s and 62s and men in their 73s are more likely to develop this condition. What are the signs or symptoms? Symptoms of this condition include: Veins that are enlarged, bulging, or twisted (varicose veins). Skin breakdown or ulcers. Reddened skin or dark discoloration of skin on the leg between the knee and ankle. Brown, smooth, tight, and painful skin just above the ankle, usually on the inside of the leg (lipodermatosclerosis). Swelling of the legs. How is this diagnosed? This condition may be diagnosed based on: Your medical history. A physical exam. Tests, such as: A procedure that creates an image of a blood vessel and nearby organs and provides information about blood flow through the blood vessel (duplex ultrasound). A procedure that tests blood flow (plethysmography). A procedure that looks at the veins using X-ray and dye (venogram). How is this treated? The goals of treatment are to help you return to an active life and to minimize pain or  disability. Treatment depends on the severity of your condition, and it may include: Wearing compression stockings. These can help relieve symptoms and help prevent your condition from getting worse. However, they do not cure the condition. Sclerotherapy. This procedure involves an injection of a solution that shrinks damaged veins. Surgery. This may involve: Removing a diseased vein (vein stripping). Cutting off blood flow through the vein (laser ablation surgery). Repairing or reconstructing a valve within the affected vein. Follow these  instructions at home: Wear compression stockings as told by your health care provider. These stockings help to prevent blood clots and reduce swelling in your legs. Take over-the-counter and prescription medicines only as told by your health care provider. Stay active by exercising, walking, or doing different activities. Ask your health care provider what activities are safe for you and how much exercise you need. Drink enough fluid to keep your urine pale yellow. Do not use any products that contain nicotine or tobacco, such as cigarettes, e-cigarettes, and chewing tobacco. If you need help quitting, ask your health care provider. Keep all follow-up visits as told by your health care provider. This is important.

## 2022-12-10 NOTE — Progress Notes (Signed)
Office Visit    Patient Name: [name] Date of Encounter: 12/10/2022  PCP:  [name], MD   [facility]  Cardiologist:  Chilton Si, MD  Advanced Practice Provider:  No care team member to display Electrophysiologist:  None      Chief Complaint    [name] is a 57 y.o. female presents today for lower extremity edema  Past Medical History    Past Medical History:  Diagnosis Date   Allergy-induced asthma    no current med.   Complication of anesthesia    states is hard to get to sleep and hard to wake up   History of CVA (cerebrovascular accident) 2004   states no deficits   Innocent heart murmur    per pt.   Medial meniscus tear 08/2016   left knee   Seasonal allergies    Past Surgical History:  Procedure Laterality Date   ABDOMINAL HYSTERECTOMY     CARDIAC CATHETERIZATION N/A 11/28/2015   Procedure: Left Heart Cath and Coronary Angiography;  Surgeon: Yates Decamp, MD;  Location: [facility] INVASIVE CV LAB;  Service: Cardiovascular;  Laterality: N/A;   CYSTOSCOPY  09/29/2007   KNEE ARTHROSCOPY Right 05/2016   possibly 2019, Dr. Turner Daniels   KNEE ARTHROSCOPY Left 1994   KNEE ARTHROSCOPY Left 09/05/2016   Procedure: ARTHROSCOPY KNEE WITH PLICAECTOMY;  Surgeon: Gean Birchwood, MD;  Location: [facility];  Service: Orthopedics;  Laterality: Left;   KNEE ARTHROSCOPY Left 03/09/2022   w/ meniscal removal, Dr. Turner Daniels   LAPAROSCOPIC HYSTERECTOMY  09/29/2007   partial   TUBAL LIGATION     x 2    Allergies  Allergies  Allergen Reactions   Other     Other reaction(s): Respiratory Distress Other reaction(s): Respiratory Distress Other reaction(s): Eye Redness   Tomato Anaphylaxis   Grass Pollen(K-O-R-T-Swt Vern)     Other reaction(s): Other    History of Present Illness    [name] is a 57 y.o. female with a hx of prior TIA 2002), palpitations, hyperlipidemia, OSA on CPAP last seen 02/04/2020.   Family history notable for hypertrophic cardiomyopathy in her sister.  Her nephew had sudden cardiac death while playing basketball which was treated with defibrillator.  Initially evaluated 05/2017 for palpitations.  30-day event monitor with sinus rhythm and no arrhythmias.  Prior [facility] 2017 with normal coronary arteries.  Echocardiogram 2021 due to family history of hypertrophic cardiomyopathy with no evidence of LVH.  She contacted the office noting swelling in both ankles and tingling in the left leg.  She had recently flown to [place] and eaten a higher sodium diet than usual.  She presents today for follow up.   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 Notes in September after a track event in [place] when she got home had swollen ankle which stayed swollen for 4 days despite wearing compression stockings. Had another episode in November. Evaluated by primary care recommended to continue compression stockings. Shares with me that this past weekend was in [place]   Notes she has some baseline mild edema in her ankle today. Notes her left is usually worse than right. This was after airline travel to [place]. Aundria Rud me last week she had pins and needles in the bottom of her foot. Reports no claudication symptoms. She does have left knee pain (had orthoscopic repair of meniscus last July).   Also notes a "tweaky weird" pain in her left chest. She notes it most  often when taking a deep breath in her left chest that radiates through to her back. Notes it is intermittent, follows no clear pattern. Occurs both at rest and with activity. No noted relieving factors. Aggravated by deep breathing.   She notes she has been exercising regularly 3-4x per week and also walks for exercise. No significant chest pain nor dyspnea with exercise. Reports no shortness of breath nor exertional dyspnea. No orthopnea, PND. Reports wearing her CPAP regularly.   Labs via [facility] 08/2022 TSH 1.29, hematocrit 39.3 Creatinine 0.89,  GFR 76, platelets 496, K4.5, ALT 14, AST 14 total cholesterol 168, HDL 61, LDL 92, triglycerides 78  EKGs/Labs/Other Studies Reviewed:   The following studies were reviewed today: Cardiac Studies & Procedures   CARDIAC CATHETERIZATION  CARDIAC CATHETERIZATION 11/28/2015  Narrative  The left ventricular systolic function is normal.  normal coronary arteries  Rec: Evaluation for noncardiac causes of chest pain is indicated. Disposition: Will be discharged home tomorrow with outpatient follow up.  Findings Coronary Findings Diagnostic  Dominance: Right  Left Anterior Descending . Vessel is angiographically normal.  Ramus Intermedius . Vessel is angiographically normal.  Left Circumflex . Vessel is angiographically normal.  First Obtuse Marginal Branch The vessel is small in size.  Second Obtuse Marginal Branch The vessel is small in size.  Right Coronary Artery . Vessel is angiographically normal.  Intervention  No interventions have been documented.     ECHOCARDIOGRAM  ECHOCARDIOGRAM COMPLETE 01/11/2020  Narrative ECHOCARDIOGRAM REPORT    Patient Name:   [name] Date of Exam: 01/11/2020 Medical Rec #:  [id number]            Height:       68.5 in Accession #:    [id number]           Weight:       149.8 lb Date of Birth:  07/09/1966            BSA:          1.817 m Patient Age:    53 years             BP:           116/76 mmHg Patient Gender: F                    HR:           60 bpm. Exam Location:  [address]  Procedure: 2D Echo, Cardiac Doppler and Color Doppler  Indications:    R07.9  History:        Patient has no prior history of Echocardiogram examinations. Stroke; Signs/Symptoms:Chest Pain.  Sonographer:    Samule Ohm RDCS Referring Phys: [id number] TIFFANY [place]  IMPRESSIONS   1. Normal LV function; trace MR; mild TR. 2. Left ventricular ejection fraction, by estimation, is 55 to 60%. The left ventricle has normal  function. The left ventricle has no regional wall motion abnormalities. Left ventricular diastolic parameters were normal. 3. Right ventricular systolic function is normal. The right ventricular size is normal. There is normal pulmonary artery systolic pressure. 4. The mitral valve is normal in structure. Trivial mitral valve regurgitation. No evidence of mitral stenosis. 5. The aortic valve is tricuspid. Aortic valve regurgitation is not visualized. No aortic stenosis is present. 6. The inferior vena cava is normal in size with greater than 50% respiratory variability, suggesting right atrial pressure of 3 mmHg.  FINDINGS Left Ventricle: Left ventricular ejection fraction, by estimation, is 55  to 60%. The left ventricle has normal function. The left ventricle has no regional wall motion abnormalities. The left ventricular internal cavity size was normal in size. There is no left ventricular hypertrophy. Left ventricular diastolic parameters were normal.  Right Ventricle: The right ventricular size is normal.Right ventricular systolic function is normal. There is normal pulmonary artery systolic pressure. The tricuspid regurgitant velocity is 2.04 m/s, and with an assumed right atrial pressure of 10 mmHg, the estimated right ventricular systolic pressure is 26.6 mmHg.  Left Atrium: Left atrial size was normal in size.  Right Atrium: Right atrial size was normal in size.  Pericardium: There is no evidence of pericardial effusion.  Mitral Valve: The mitral valve is normal in structure. Normal mobility of the mitral valve leaflets. Trivial mitral valve regurgitation. No evidence of mitral valve stenosis.  Tricuspid Valve: The tricuspid valve is normal in structure. Tricuspid valve regurgitation is mild . No evidence of tricuspid stenosis.  Aortic Valve: The aortic valve is tricuspid. Aortic valve regurgitation is not visualized. No aortic stenosis is present.  Pulmonic Valve: The pulmonic valve  was normal in structure. Pulmonic valve regurgitation is trivial. No evidence of pulmonic stenosis.  Aorta: The aortic root is normal in size and structure.  Venous: The inferior vena cava is normal in size with greater than 50% respiratory variability, suggesting right atrial pressure of 3 mmHg.  IAS/Shunts: No atrial level shunt detected by color flow Doppler.  Additional Comments: Normal LV function; trace MR; mild TR.   LEFT VENTRICLE PLAX 2D LVIDd:         3.90 cm  Diastology LVIDs:         2.50 cm  LV e' lateral:   9.36 cm/s LV PW:         1.40 cm  LV E/e' lateral: 8.2 LV IVS:        1.20 cm  LV e' medial:    7.51 cm/s LVOT diam:     2.10 cm  LV E/e' medial:  10.3 LV SV:         63 LV SV Index:   35 LVOT Area:     3.46 cm   RIGHT VENTRICLE             IVC RV S prime:     12.40 cm/s  IVC diam: 1.60 cm TAPSE (M-mode): 2.0 cm RVSP:           19.6 mmHg  LEFT ATRIUM             Index       RIGHT ATRIUM           Index LA diam:        3.20 cm 1.76 cm/m  RA Pressure: 3.00 mmHg LA Vol (A2C):   38.3 ml 21.07 ml/m RA Area:     15.80 cm LA Vol (A4C):   25.3 ml 13.92 ml/m RA Volume:   42.20 ml  23.22 ml/m LA Biplane Vol: 31.4 ml 17.28 ml/m AORTIC VALVE LVOT Vmax:   84.20 cm/s LVOT Vmean:  55.700 cm/s LVOT VTI:    0.182 m  AORTA Ao Root diam: 3.20 cm Ao Asc diam:  3.10 cm  MV E velocity: 77.10 cm/s  TRICUSPID VALVE MV A velocity: 66.00 cm/s  TR Peak grad:   16.6 mmHg MV E/A ratio:  1.17        TR Vmax:        204.00 cm/s Estimated RAP:  3.00 mmHg RVSP:  19.6 mmHg  SHUNTS Systemic VTI:  0.18 m Systemic Diam: 2.10 cm  Olga Millers MD Electronically signed by Olga Millers MD Signature Date/Time: 01/11/2020/11:52:38 AM    Final    MONITORS  CARDIAC EVENT MONITOR 07/19/2017  Narrative 30 Day Event Monitor  Quality: Fair.  Baseline artifact. Predominant rhythm: sinus rhythm Average heart rate: 74 bpm Max heart rate: 141 bpm Min heart rate:  41 bpm  Patient reported chest pain, at which time sinus rhythm was noted.  Tiffany C. Duke Salvia, MD, [facility] 07/19/2017 4:05 PM            EKG:  EKG is  ordered today.  The ekg ordered today demonstrates NSR 79 bpm with stable TWI to II, III, V2-V5.  Recent Labs: 09/17/2022: ALT 14; BUN 10; Creatinine, Ser 0.89; Hemoglobin 12.4; Platelets 496; Potassium 4.5; Sodium 144; TSH 1.290  Recent Lipid Panel    Component Value Date/Time   CHOL 168 09/17/2022 0947   TRIG 78 09/17/2022 0947   HDL 61 09/17/2022 0947   CHOLHDL 2.8 09/17/2022 0947   CHOLHDL 3.4 11/28/2015 1200   VLDL 22 11/28/2015 1200   LDLCALC 92 09/17/2022 0947     Home Medications   Current Meds  Medication Sig   albuterol (VENTOLIN HFA) 108 (90 Base) MCG/ACT inhaler Inhale 2 puffs into the lungs every 6 (six) hours as needed for wheezing or shortness of breath.   amoxicillin (AMOXIL) 500 MG tablet Take 4 tablets one hour prior to dental appt prn   Calcium-Vitamin D-Vitamin K [phone] MG-UNT-MCG TABS Take 1 tablet by mouth daily.   cetirizine (ZYRTEC) 10 MG tablet Take 10 mg by mouth daily.   clobetasol ointment (TEMOVATE) 0.05 % Apply topically 3 (three) times a week.   montelukast (SINGULAIR) 10 MG tablet Take 10 mg by mouth at bedtime.   rosuvastatin (CRESTOR) 10 MG tablet TAKE 1 TABLET BY MOUTH EVERYDAY AT BEDTIME   RYALTRIS 665-25 MCG/ACT SUSP SMARTSIG:1 Spray(s) Both Nares 1-2 Times Daily   SODIUM FLUORIDE, DENTAL RINSE, 0.2 % SOLN Take by mouth as directed.   XIIDRA 5 % SOLN Apply 1 drop to eye 2 (two) times daily.      Review of Systems      All other systems reviewed and are otherwise negative except as noted above.  Physical Exam    VS:  BP 110/72 (BP Location: Left Arm, Patient Position: Sitting, Cuff Size: Normal)   Pulse 79   Ht 5' 7.2" (1.707 m)   Wt 163 lb 9.6 oz (74.2 kg)   BMI 25.47 kg/m  , BMI Body mass index is 25.47 kg/m.  Wt Readings from Last 3 Encounters:  12/10/22 163 lb 9.6 oz  (74.2 kg)  09/17/22 160 lb 12.8 oz (72.9 kg)  08/29/22 156 lb 9.6 oz (71 kg)    GEN: Well nourished, well developed, in no acute distress. HEENT: normal. Neck: Supple, no JVD, carotid bruits, or masses. Cardiac: RRR, no murmurs, rubs, or gallops. No clubbing, cyanosis.  Nonpitting bilateral pretibial edema..  Radials/PT 2+ and equal bilaterally.  Respiratory:  Respirations regular and unlabored, clear to auscultation bilaterally. GI: Soft, nontender, nondistended. MS: No deformity or atrophy. Skin: Warm and dry, no rash. Neuro:  Strength and sensation are intact. Psych: Normal affect.  Assessment & Plan    LE edema - Nonpitting pretibial edema on exam. No dyspnea, orthopnea suggestive of HF. Likely etiology venous insufficiency.  Encouraged to elevate lower extremities when sitting and wear compression stockings.  Chest pain  in adult - EKG today stable compared to previous. Known baseline T wave abnormalities. Reports left sided chest pain radiating through her chest. Consider etiology CAD vs musculoskeletal. Plan for cardiac CTA to rule out ischemia.  If noted will need to escalate statin therapy for updated LDL goal <70.  Hx of TIA - Continue Rosuvastatin - managed  by PCP.  OSA - CPAP compliance encouraged. Notes wearing regularly.  HLD -09/17/2022 total cholesterol 168, HDL 61, LDL 92, triglycerides 78  FH of hypertrophic cardiomyopathy - Echo 2021 with no HCM nor LVH.  No indication for repeat echocardiogram this time.        Disposition: Follow up in 1 year(s) with Chilton Si, MD or APP.  Signed, Alver Sorrow, NP 12/10/2022, 2:44 PM  [facility]

## 2022-12-11 ENCOUNTER — Ambulatory Visit: Payer: Federal, State, Local not specified - PPO | Admitting: Nurse Practitioner

## 2022-12-11 ENCOUNTER — Encounter: Payer: Self-pay | Admitting: Nurse Practitioner

## 2022-12-11 VITALS — BP 114/58 | HR 71 | Temp 98.3°F | Ht 68.0 in | Wt 162.8 lb

## 2022-12-11 DIAGNOSIS — J0140 Acute pansinusitis, unspecified: Secondary | ICD-10-CM | POA: Diagnosis not present

## 2022-12-11 LAB — POC COVID19 BINAXNOW: SARS Coronavirus 2 Ag: NEGATIVE

## 2022-12-11 MED ORDER — AMOXICILLIN-POT CLAVULANATE 875-125 MG PO TABS: 1.0000 | ORAL_TABLET | Freq: Two times a day (BID) | ORAL | 0 refills | Status: DC

## 2022-12-11 MED ORDER — TRIAMCINOLONE ACETONIDE 40 MG/ML IJ SUSP
60.0000 mg | Freq: Once | INTRAMUSCULAR | Status: AC
  Administered 2022-12-11: 60 mg via INTRAMUSCULAR

## 2022-12-11 NOTE — Progress Notes (Signed)
I,Sheena H Holbrook,acting as a Neurosurgeon for Arnette Felts, FNP.,have documented all relevant documentation on the behalf of Arnette Felts, FNP,as directed by  Arnette Felts, FNP while in the presence of Arnette Felts, FNP.    Subjective:     Patient ID: [name] , female    DOB: 03-31-1966 , 57 y.o.   MRN: [id number]   Chief Complaint  Patient presents with   Nasal Congestion    HPI  Patient presents today for allergy, and sinus cong. This been going on for 3 days now. Glands in neck are painful/sore. Saturday was outside during a track meet in [place] - rinsed. Sunday evening started with congestion. She is now on singulair 2 weeks before and taking zyrtec. Then on yesterday was worse. She has a CPAP now that she is not able to breath. She is due to leave on Saturday morning on a plane.   Last year had more allergy issues with migraines, nausea and back on her allergy medications. She has not been outside walking. She was in [place] last week and did well. She had taken a steroid dose pack in December due to having "fluid" in her ear.       Past Medical History:  Diagnosis Date   Allergy-induced asthma    no current med.   Complication of anesthesia    states is hard to get to sleep and hard to wake up   History of CVA (cerebrovascular accident) 2004   states no deficits   Innocent heart murmur    per pt.   Medial meniscus tear 08/2016   left knee   Seasonal allergies      Family History  Problem Relation Age of Onset   Hypertension Mother    Diabetes Mother    Hyperlipidemia Mother    Heart disease Mother    Pancreatic cancer Father    Diabetes Sister    Hypertension Sister    Hyperlipidemia Sister      Current Outpatient Medications:    albuterol (VENTOLIN HFA) 108 (90 Base) MCG/ACT inhaler, Inhale 2 puffs into the lungs every 6 (six) hours as needed for wheezing or shortness of breath., Disp: 8 g, Rfl: 2   amoxicillin (AMOXIL) 500 MG tablet, Take 4  tablets one hour prior to dental appt prn, Disp: 4 tablet, Rfl: 2   amoxicillin-clavulanate (AUGMENTIN) 875-125 MG tablet, Take 1 tablet by mouth 2 (two) times daily., Disp: 14 tablet, Rfl: 0   Calcium-Vitamin D-Vitamin K [phone] MG-UNT-MCG TABS, Take 1 tablet by mouth daily., Disp: , Rfl:    cetirizine (ZYRTEC) 10 MG tablet, Take 10 mg by mouth daily., Disp: , Rfl:    clobetasol ointment (TEMOVATE) 0.05 %, Apply topically 3 (three) times a week., Disp: , Rfl:    metoprolol tartrate (LOPRESSOR) 100 MG tablet, Take two hours prior to cardiac CT., Disp: 1 tablet, Rfl: 0   montelukast (SINGULAIR) 10 MG tablet, Take 10 mg by mouth at bedtime., Disp: , Rfl:    rosuvastatin (CRESTOR) 10 MG tablet, TAKE 1 TABLET BY MOUTH EVERYDAY AT BEDTIME, Disp: 90 tablet, Rfl: 2   RYALTRIS 665-25 MCG/ACT SUSP, SMARTSIG:1 Spray(s) Both Nares 1-2 Times Daily, Disp: , Rfl:    SODIUM FLUORIDE, DENTAL RINSE, 0.2 % SOLN, Take by mouth as directed., Disp: , Rfl:    XIIDRA 5 % SOLN, Apply 1 drop to eye 2 (two) times daily. , Disp: , Rfl: 3   Allergies  Allergen Reactions   Other  Other reaction(s): Respiratory Distress Other reaction(s): Respiratory Distress Other reaction(s): Eye Redness   Tomato Anaphylaxis   Grass Pollen(K-O-R-T-Swt Vern)     Other reaction(s): Other     Review of Systems  Constitutional:  Negative for fatigue.  HENT:  Positive for congestion, ear pain, sinus pressure and sneezing.   Psychiatric/Behavioral: Negative.       Today's Vitals   12/11/22 1020  BP: (!) 114/58  Pulse: 71  Temp: 98.3 F (36.8 C)  TempSrc: Oral  SpO2: 93%  Weight: 162 lb 12.8 oz (73.8 kg)  Height: 5\' 8"  (1.727 m)   Body mass index is 24.75 kg/m.   Objective:  Physical Exam Vitals reviewed.  Constitutional:      General: She is not in acute distress.    Appearance: Normal appearance.  Cardiovascular:     Pulses: Normal pulses.     Heart sounds: Normal heart sounds. No murmur heard. Pulmonary:      Effort: Pulmonary effort is normal. No respiratory distress.     Breath sounds: Normal breath sounds. No wheezing.  Skin:    General: Skin is warm and dry.     Capillary Refill: Capillary refill takes less than 2 seconds.  Neurological:     General: No focal deficit present.     Mental Status: She is alert and oriented to person, place, and time.     Cranial Nerves: No cranial nerve deficit.     Motor: No weakness.         Assessment And Plan:     1. Acute non-recurrent pansinusitis Comments: If not better with treatment may need Rocephin injection, return call to office on Wednesday afternoon to update if not better - triamcinolone acetonide (KENALOG-40) injection 60 mg - amoxicillin-clavulanate (AUGMENTIN) 875-125 MG tablet; Take 1 tablet by mouth 2 (two) times daily.  Dispense: 14 tablet; Refill: 0 - POC COVID-19     Patient was given opportunity to ask questions. Patient verbalized understanding of the plan and was able to repeat key elements of the plan. All questions were answered to their satisfaction.  Arnette Felts, FNP   I, Arnette Felts, FNP, have reviewed all documentation for this visit. The documentation on 12/11/22 for the exam, diagnosis, procedures, and orders are all accurate and complete.   IF YOU HAVE BEEN REFERRED TO A SPECIALIST, IT MAY TAKE 1-2 WEEKS TO SCHEDULE/PROCESS THE REFERRAL. IF YOU HAVE NOT HEARD FROM US/SPECIALIST IN TWO WEEKS, PLEASE GIVE [place] A CALL AT [phone].   THE PATIENT IS ENCOURAGED TO PRACTICE SOCIAL DISTANCING DUE TO THE COVID-19 PANDEMIC.

## 2022-12-12 ENCOUNTER — Encounter: Payer: Self-pay | Admitting: Nurse Practitioner

## 2022-12-21 ENCOUNTER — Encounter: Payer: Self-pay | Admitting: Nurse Practitioner

## 2022-12-25 ENCOUNTER — Encounter: Payer: Self-pay | Admitting: Podiatry

## 2022-12-25 ENCOUNTER — Ambulatory Visit (INDEPENDENT_AMBULATORY_CARE_PROVIDER_SITE_OTHER): Payer: Federal, State, Local not specified - PPO

## 2022-12-25 ENCOUNTER — Ambulatory Visit: Payer: Federal, State, Local not specified - PPO | Admitting: Podiatry

## 2022-12-25 DIAGNOSIS — G4733 Obstructive sleep apnea (adult) (pediatric): Secondary | ICD-10-CM | POA: Diagnosis not present

## 2022-12-25 DIAGNOSIS — R600 Localized edema: Secondary | ICD-10-CM | POA: Diagnosis not present

## 2022-12-25 DIAGNOSIS — M778 Other enthesopathies, not elsewhere classified: Secondary | ICD-10-CM

## 2022-12-25 DIAGNOSIS — E782 Mixed hyperlipidemia: Secondary | ICD-10-CM | POA: Diagnosis not present

## 2022-12-25 DIAGNOSIS — Z8673 Personal history of transient ischemic attack (TIA), and cerebral infarction without residual deficits: Secondary | ICD-10-CM | POA: Diagnosis not present

## 2022-12-25 LAB — BASIC METABOLIC PANEL
BUN/Creatinine Ratio: 23 (ref 9–23)
BUN: 17 mg/dL (ref 6–24)
CO2: 23 mmol/L (ref 20–29)
Calcium: 9.3 mg/dL (ref 8.7–10.2)
Chloride: 103 mmol/L (ref 96–106)
Creatinine, Ser: 0.75 mg/dL (ref 0.57–1.00)
Glucose: 80 mg/dL (ref 70–99)
Potassium: 4.5 mmol/L (ref 3.5–5.2)
Sodium: 139 mmol/L (ref 134–144)
eGFR: 93 mL/min/{1.73_m2} (ref >59)

## 2022-12-25 MED ORDER — METHYLPREDNISOLONE 4 MG PO TBPK: ORAL_TABLET | ORAL | 0 refills | Status: DC

## 2022-12-25 NOTE — Progress Notes (Signed)
She presents today with tenderness beneath the first metatarsal phalangeal joint of the right foot.  She states initially the pain was in the joint of the toe now seems to be underneath the toe.  She states that is been no injury she was recently on a trip and did a lot of walking in the [place] and [place].  Objective: Vital signs stable oriented x 3.  Pulses are palpable.  She has reproducible pain on palpation at the base of the proximal phalanx of the hallux particularly the medial slip of the flexor hallucis brevis.  Radiographs taken today do not demonstrate any type of osseous abnormalities osseously mature individual with good mineralization.  Assessment probable flexor hallucis brevis tendinitis at its insertion.  Plan: At this point I started her on a Medrol Dosepak and placed her in a Darco shoe which she has at home.  Like to follow-up with her in 1 month if not improved.

## 2022-12-26 ENCOUNTER — Telehealth: Payer: Self-pay | Admitting: *Deleted

## 2022-12-26 DIAGNOSIS — G4733 Obstructive sleep apnea (adult) (pediatric): Secondary | ICD-10-CM | POA: Diagnosis not present

## 2022-12-26 NOTE — Telephone Encounter (Signed)
Medium Boot fitted and dispensed today.

## 2022-12-27 DIAGNOSIS — L669 Cicatricial alopecia, unspecified: Secondary | ICD-10-CM | POA: Diagnosis not present

## 2022-12-27 DIAGNOSIS — L668 Other cicatricial alopecia: Secondary | ICD-10-CM | POA: Diagnosis not present

## 2022-12-28 ENCOUNTER — Telehealth (HOSPITAL_COMMUNITY): Payer: Self-pay | Admitting: *Deleted

## 2022-12-28 DIAGNOSIS — Z713 Dietary counseling and surveillance: Secondary | ICD-10-CM | POA: Diagnosis not present

## 2022-12-28 NOTE — Telephone Encounter (Signed)
Reaching out to patient to offer assistance regarding upcoming cardiac imaging study; pt verbalizes understanding of appt date/time, parking situation and where to check in, pre-test NPO status and medications ordered, and verified current allergies; name and call back number provided for further questions should they arise  Larey Brick RN Navigator Cardiac Imaging [facility] [phone] office [phone] cell  Patient to take 100mg  metoprolol tartrate two hours prior to cardiac CT scan. She is aware to arrive at 7:30am.

## 2022-12-31 ENCOUNTER — Ambulatory Visit (HOSPITAL_COMMUNITY)
Admission: RE | Admit: 2022-12-31 | Discharge: 2022-12-31 | Disposition: A | Discharge status: Home / Self Care | Payer: Federal, State, Local not specified - PPO | Source: Ambulatory Visit | Attending: Family | Admitting: Family

## 2022-12-31 DIAGNOSIS — R600 Localized edema: Secondary | ICD-10-CM | POA: Insufficient documentation

## 2022-12-31 DIAGNOSIS — R072 Precordial pain: Secondary | ICD-10-CM | POA: Insufficient documentation

## 2022-12-31 DIAGNOSIS — G4733 Obstructive sleep apnea (adult) (pediatric): Secondary | ICD-10-CM | POA: Diagnosis not present

## 2022-12-31 DIAGNOSIS — E782 Mixed hyperlipidemia: Secondary | ICD-10-CM

## 2022-12-31 DIAGNOSIS — Z8673 Personal history of transient ischemic attack (TIA), and cerebral infarction without residual deficits: Secondary | ICD-10-CM | POA: Insufficient documentation

## 2022-12-31 DIAGNOSIS — J301 Allergic rhinitis due to pollen: Secondary | ICD-10-CM | POA: Diagnosis not present

## 2022-12-31 MED ORDER — NITROGLYCERIN 0.4 MG SL SUBL
SUBLINGUAL_TABLET | SUBLINGUAL | Status: AC
  Filled 2022-12-31: qty 2

## 2022-12-31 MED ORDER — NITROGLYCERIN 0.4 MG SL SUBL
0.8000 mg | SUBLINGUAL_TABLET | Freq: Once | SUBLINGUAL | Status: AC
  Administered 2022-12-31: 0.8 mg via SUBLINGUAL

## 2022-12-31 MED ORDER — IOHEXOL 350 MG/ML SOLN
95.0000 mL | Freq: Once | INTRAVENOUS | Status: AC | PRN
  Administered 2022-12-31: 95 mL via INTRAVENOUS

## 2023-01-02 DIAGNOSIS — F330 Major depressive disorder, recurrent, mild: Secondary | ICD-10-CM | POA: Diagnosis not present

## 2023-01-07 ENCOUNTER — Ambulatory Visit: Payer: Federal, State, Local not specified - PPO | Admitting: Internal Medicine

## 2023-01-07 NOTE — Progress Notes (Unsigned)
Sleep Medicine   Office Visit  Patient Name: [name] DOB: 12/26/1972 MRN [id number]    Chief Complaint: ***  Brief History:  Kathryn presents for initial sleep consult with a *** history of ***. Sleep quality is ***. This is noted *** nights. The patient's bed partner reports  *** at night. The patient relates the following symptoms: *** are also present. The patient goes to sleep at *** and wakes up at ***. Sleep quality is *** when outside home environment.  Patient has noted *** of his legs at night.  The patient  relates *** behavior during the night.  The patient *** a history of psychiatric problems. The Epworth Sleepiness Score is *** out of 24 .  The patient relates  Cardiovascular risk factors include: *** The patient reports ***    ROS  General: (-) fever, (-) chills, (-) night sweat Nose and Sinuses: (-) nasal stuffiness or itchiness, (-) postnasal drip, (-) nosebleeds, (-) sinus trouble. Mouth and Throat: (-) sore throat, (-) hoarseness. Neck: (-) swollen glands, (-) enlarged thyroid, (-) neck pain. Respiratory: *** cough, *** shortness of breath, *** wheezing. Neurologic: *** numbness, *** tingling. Psychiatric: *** anxiety, *** depression Sleep behavior: ***sleep paralysis ***hypnogogic hallucinations ***dream enactment      ***vivid dreams ***cataplexy ***night terrors ***sleep walking   Current Medication: No outpatient encounter medications on file as of 10/04/2022.   No facility-administered encounter medications on file as of 10/04/2022.    Surgical History: *** The histories are not reviewed yet. Please review them in the "History" navigator section and refresh this SmartLink.  Medical History: No past medical history on file.  Family History: Non contributory to the present illness  Social History: Social History   Socioeconomic History   Marital status: Not on file    Spouse name: Not on file   Number of children: Not on file   Years of  education: Not on file   Highest education level: Not on file  Occupational History   Not on file  Tobacco Use   Smoking status: Not on file   Smokeless tobacco: Not on file  Substance and Sexual Activity   Alcohol use: Not on file   Drug use: Not on file   Sexual activity: Not on file  Other Topics Concern   Not on file  Social History Narrative   Not on file   Social Determinants of Health   Financial Resource Strain: Not on file  Food Insecurity: Not on file  Transportation Needs: Not on file  Physical Activity: Not on file  Stress: Not on file  Social Connections: Not on file  Intimate Partner Violence: Not on file    Vital Signs: There were no vitals taken for this visit. There is no height or weight on file to calculate BMI.   Examination: General Appearance: The patient is well-developed, well-nourished, and in no distress. Neck Circumference: *** Skin: Gross inspection of skin unremarkable. Head: normocephalic, no gross deformities. Eyes: no gross deformities noted. ENT: ears appear grossly normal Neurologic: Alert and oriented. No involuntary movements.    STOP BANG RISK ASSESSMENT S (snore) Have you been told that you snore?     YES/N   T (tired) Are you often tired, fatigued, or sleepy during the day?   YES/NO  O (obstruction) Do you stop breathing, choke, or gasp during sleep? YES/NO   P (pressure) Do you have or are you being treated for high blood pressure? YES/NO   B (BMI) Is   your body index greater than 35 kg/m? YES/NO   A (age) Are you 50 years old or older? YES/NO   N (neck) Do you have a neck circumference greater than 16 inches?   YES/NO   G (gender) Are you a female? YES/NO   TOTAL STOP/BANG "YES" ANSWERS                                                                A STOP-Bang score of 2 or less is considered low risk, and a score of 5 or more is high risk for having either moderate or severe OSA. For people who score 3 or 4,  doctors may need to perform further assessment to determine how likely they are to have OSA.         EPWORTH SLEEPINESS SCALE:  Scale:  (0)= no chance of dozing; (1)= slight chance of dozing; (2)= moderate chance of dozing; (3)= high chance of dozing  Chance  Situtation    Sitting and reading: ***    Watching TV: ***    Sitting Inactive in public: ***    As a passenger in car: ***      Lying down to rest: ***    Sitting and talking: ***    Sitting quielty after lunch: ***    In a car, stopped in traffic: ***   TOTAL SCORE:   *** out of 24    SLEEP STUDIES:  ***   LABS: No results found for this or any previous visit (from the past 2160 hour(s)).  Radiology: Patient was never admitted.  No results found.  No results found.    Assessment and Plan: There are no problems to display for this patient.    PLAN OSA:   Patient evaluation suggests high risk of sleep disordered breathing due to *** Patient has comorbid cardiovascular risk factors including: *** which could be exacerbated by pathologic sleep-disordered breathing.  Suggest: *** to assess/treat the patient's sleep disordered breathing. The patient was also counselled on *** to optimize sleep health.  PLAN hypersomnia:  Patient evaluation suggests significant daytime hypersomnia.  The Epworth Sleepiness Score is elevated at *** out of 24. Patient *** drowsy driving. The patient *** MVA due to sleepiness.  The patient *** restless leg symptoms which exacerbate *** for *** nights per week. The patient *** periodic limb movements which exacerbate ***  for *** nights per week. Suggest: ***  Also suggest ***  PLAN insomnia:  Patient evaluation suggests *** insomnia. This is a chronic disorder. This has been a concern for *** and causes impaired daytime functioning. The patient exhibits comorbid ***  The history *** suggest the insomnia predates the use of hypnotic medications. The symptoms *** with the  discontinuation of these medications. There is no obvious medical, psychiatric or pharmacologic abuse issues ot account for the insomnia.  Treatment recommendations include: *** The patient should maintain a sleep log and calculate total sleep time for 1-2 weeks. Set bed and wake times for achieve 85% sleep efficiency for one week. Once this is achieved  time in bed can be gradually increased. A pharmacologic treatment approach would include a trial of *** for the next ***  months. During this time the patient is to maintain a sleep diary to   track progress.    ***  General Counseling: I have discussed the findings of the evaluation and examination with Kathryn.  I have also discussed any further diagnostic evaluation thatmay be needed or ordered today. Kathryn verbalizes understanding of the findings of todays visit. We also reviewed his medications today and discussed drug interactions and side effects including but not limited excessive drowsiness and altered mental states. We also discussed that there is always a risk not just to him but also people around him. he has been encouraged to call the office with any questions or concerns that should arise related to todays visit.  No orders of the defined types were placed in this encounter.       I have personally obtained a history, evaluated the patient, evaluated pertinent data, formulated the assessment and plan and placed orders.    Dameian Crisman A Annalynne Ibanez, MD FCCP Diplomate ABMS Pulmonary and Critical Care Medicine Sleep medicine  

## 2023-01-14 ENCOUNTER — Encounter: Payer: Self-pay | Admitting: Podiatry

## 2023-01-24 ENCOUNTER — Ambulatory Visit: Payer: Federal, State, Local not specified - PPO | Admitting: Podiatry

## 2023-01-26 DIAGNOSIS — G4733 Obstructive sleep apnea (adult) (pediatric): Secondary | ICD-10-CM | POA: Diagnosis not present

## 2023-02-06 DIAGNOSIS — F330 Major depressive disorder, recurrent, mild: Secondary | ICD-10-CM | POA: Diagnosis not present

## 2023-02-20 IMAGING — CT CT ANGIO CHEST-ABD-PELV FOR DISSECTION W/ AND WO/W CM
2 of 10 series · 11 of 46 positions shown, 15 images · IV contrast (APPLIED)
Comparison: Chest radiograph November 01, 2020

CLINICAL DATA: Chest and abdominal pain radiating to back

EXAM:
CT ANGIOGRAPHY CHEST, ABDOMEN AND PELVIS
TECHNIQUE: Non-contrast CT of the chest was initially obtained.

[Series 9: cor · coronal · 0.61mm/px · 2 of 135 slices shown]
[im 45/135  soft-tissue]
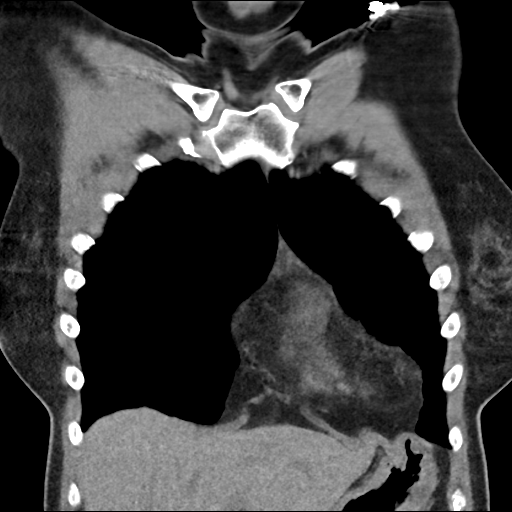
[im 90/135  soft-tissue]
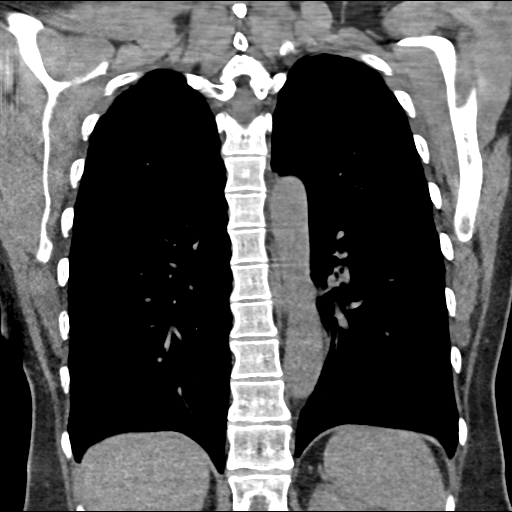

[Series 11: arterial · axial · arterial · 0.73mm/px · z∈[+829,+1351]mm · 9 of 315 slices shown, 13 images]
[im 27/315  soft-tissue]
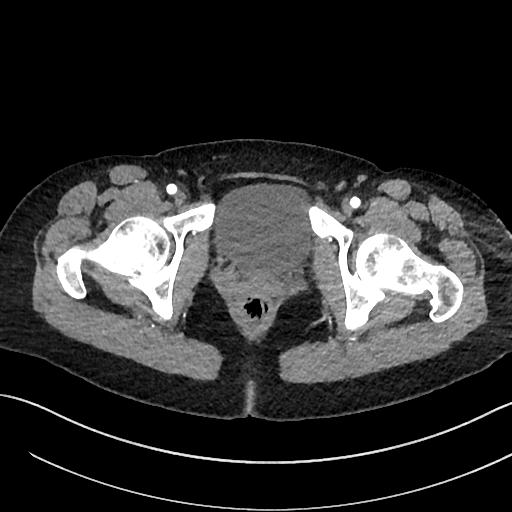
[im 27/315  bone]
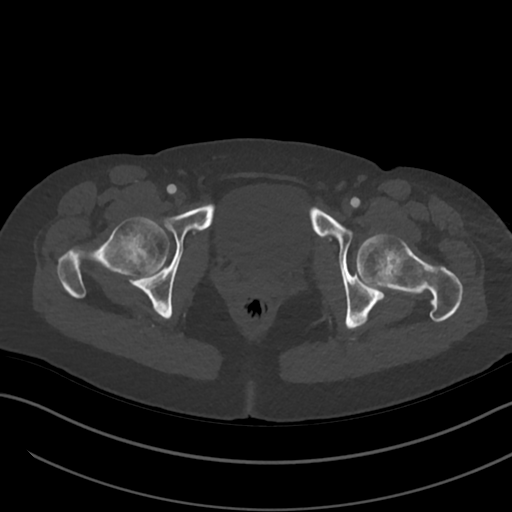
[im 79/315  soft-tissue]
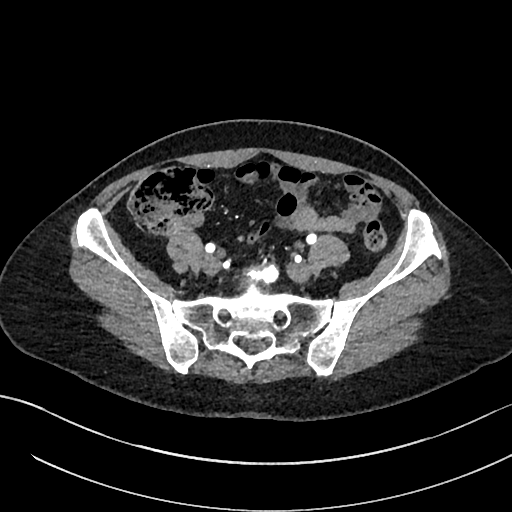
[im 105/315  soft-tissue]
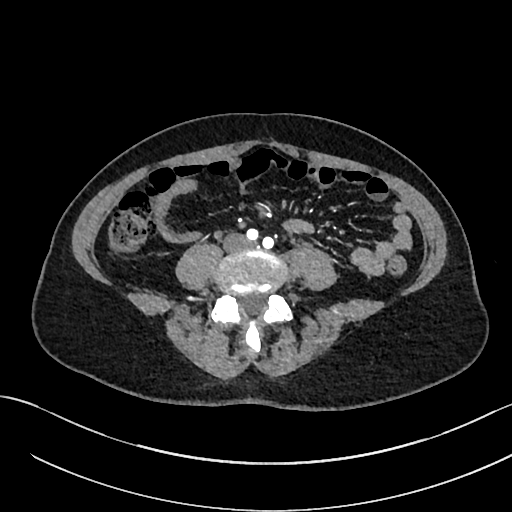
[im 131/315  soft-tissue]
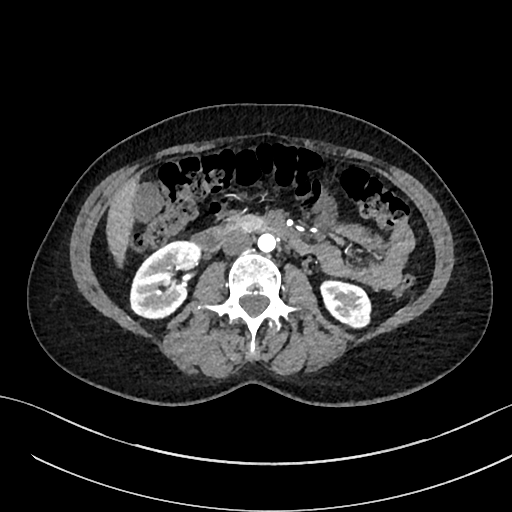
[im 184/315  soft-tissue]
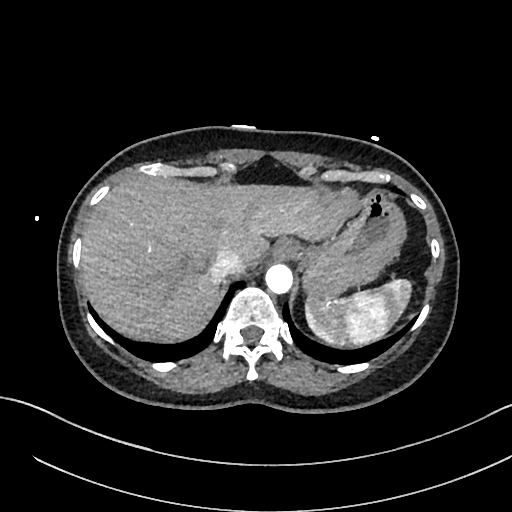
[im 210/315  soft-tissue]
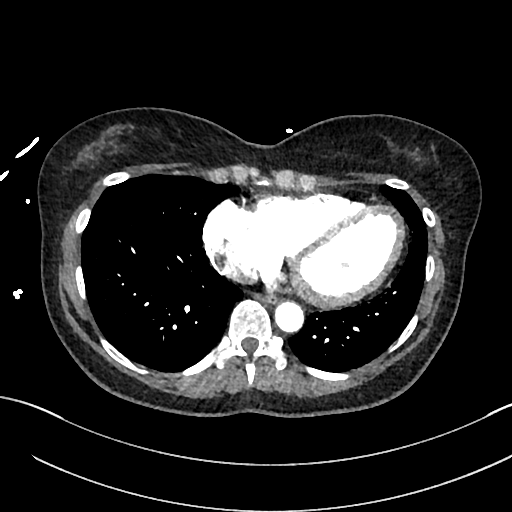
[im 210/315  lung]
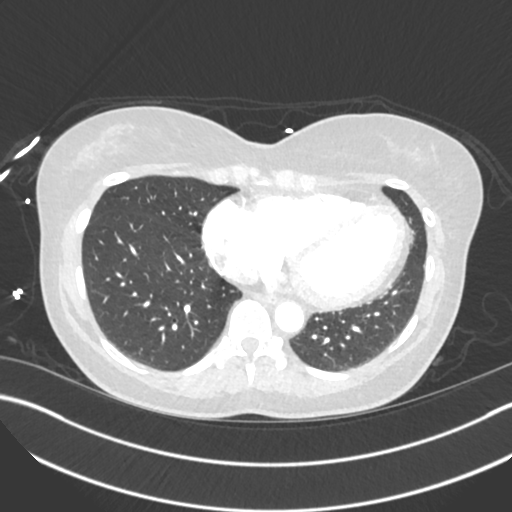
[im 236/315  soft-tissue]
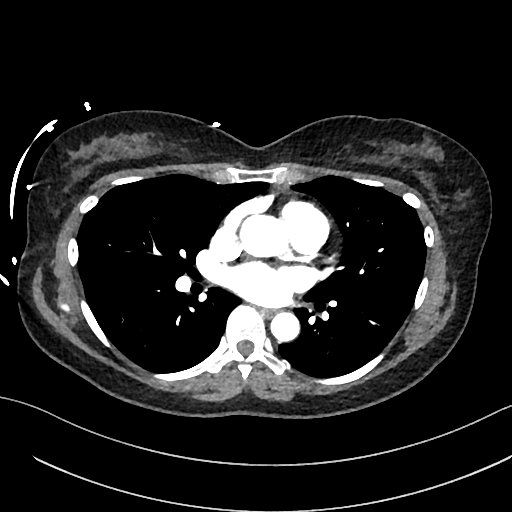
[im 236/315  lung]
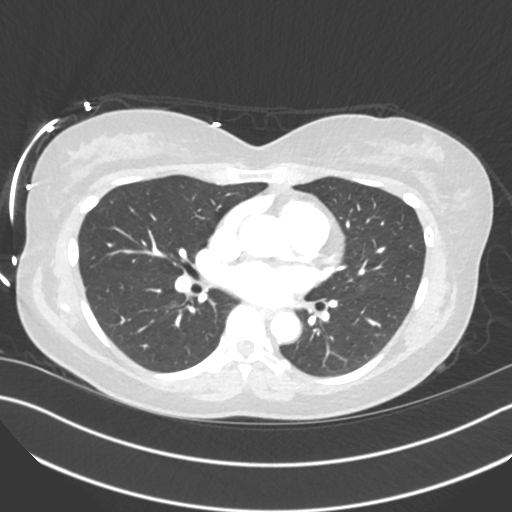
[im 262/315  lung]
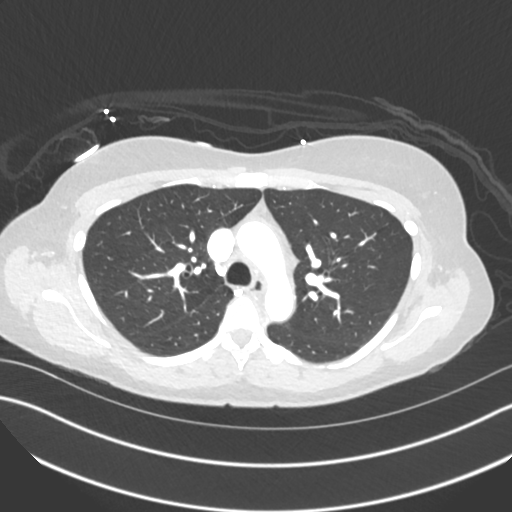
[im 288/315  soft-tissue]
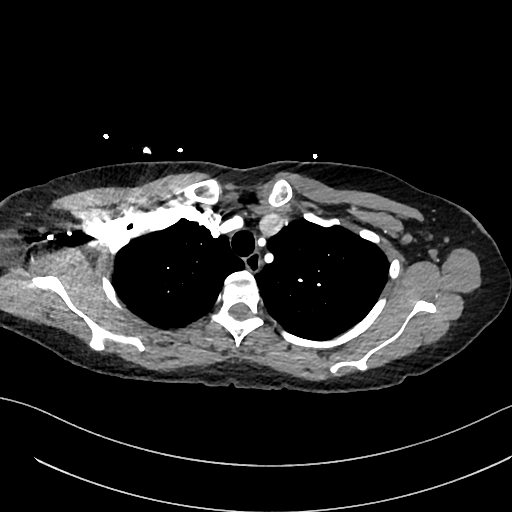
[im 288/315  lung]
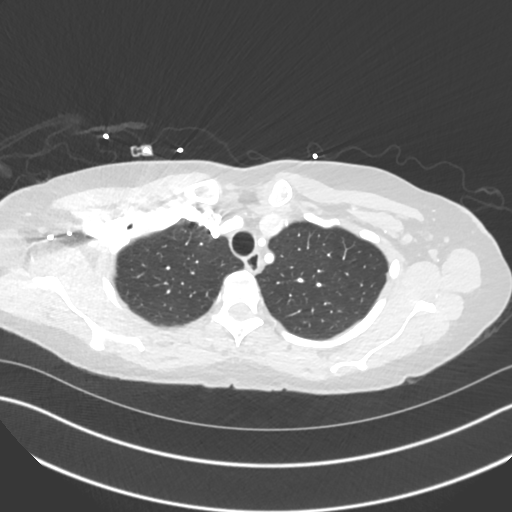

[11 of 46 positions shown; findings below may reference images not displayed]

Multidetector CT imaging through the chest, abdomen and pelvis was
performed using the standard protocol during bolus administration of
intravenous contrast. Multiplanar reconstructed images and MIPs were
obtained and reviewed to evaluate the vascular anatomy.

CONTRAST:  100mL OMNIPAQUE IOHEXOL 350 MG/ML SOLN
FINDINGS: CTA CHEST FINDINGS

Cardiovascular: There is no demonstrable thoracic aortic intramural
hematoma on the noncontrast enhanced examination. There is no
demonstrable mediastinal hematoma. There is no appreciable thoracic
aortic aneurysm or dissection. No appreciable ulceration or plaque
noted in the thoracic aorta. Visualized great vessels appear
unremarkable. There is no demonstrable pulmonary embolus. There is
no pericardial thickening or pericardial effusion evident.

Mediastinum/Nodes: There is a 6 mm nodular opacity in the right lobe
of the thyroid which per consensus guidelines does not warrant
additional imaging surveillance. There is no appreciable thoracic
adenopathy. No esophageal lesions are evident.

Lungs/Pleura: There is mild scarring in the lung apices. There is
slight bibasilar atelectasis. No edema or airspace opacity. No
pleural effusions are evident. On axial slice 26 series 7, there is
a 2 mm nodular opacity in the posterior aspect of the apical segment
of the left upper lobe.

Musculoskeletal: No fracture or dislocation. No blastic or lytic
bone lesions evident. No evident chest wall lesion.

Review of the MIP images confirms the above findings.

CTA ABDOMEN AND PELVIS FINDINGS

VASCULAR

Aorta: There is no abdominal aortic aneurysm or dissection. No
appreciable atherosclerotic plaque is noted within the abdominal
aorta.

Celiac: Celiac artery and its branches are widely patent. No
aneurysm or dissection involving these vessels.

SMA: Superior mesenteric artery and its branches are widely patent.
No aneurysm or dissection involving these vessels.

Renals: There is a single renal artery on each side. No aneurysm or
dissection involving the renal arteries or branches. No
fibromuscular dysplasia evident. Renal arteries and branches are
widely patent throughout their respective courses.

IMA: Inferior mesenteric artery and its branches are widely patent.
No aneurysm or dissection involving these vessels.

Inflow: Pelvic arterial vessels are widely patent throughout their
respective courses. No aneurysm or dissection involving these
vessels. Proximal superficial femoral and profunda femoral arteries
are also patent without aneurysm or dissection.

Veins: No obvious venous abnormality within the limitations of this
arterial phase study.

Review of the MIP images confirms the above findings.

NON-VASCULAR

Hepatobiliary: No focal liver lesions are appreciable. Gallbladder
wall is not appreciably thickened. There is no biliary duct
dilatation.

Pancreas: There is no appreciable pancreatic mass or inflammatory
focus.

Spleen: No splenic lesions evident on arterial phase imaging.

Adrenals/Urinary Tract: Adrenals bilaterally appear unremarkable.
There is no appreciable renal mass or hydronephrosis on either side.
There is no appreciable renal or ureteral calculus on either side.
Urinary bladder is midline with wall thickness within normal limits.

Stomach/Bowel: There is no appreciable bowel wall or mesenteric
thickening. Terminal ileum appears normal. No evident bowel
obstruction. No appreciable free air or portal venous air.

Lymphatic: No adenopathy is evident in the abdomen or pelvis.

Reproductive: Uterus absent.  No adnexal region masses.

Other: Appendix appears normal. No abscess or ascites evident in the
abdomen or pelvis. There is slight fat in the umbilicus.

Musculoskeletal: There is degenerative change in the lumbar spine
with vacuum phenomenon at L5-S1. No fracture or dislocation
appreciable. No blastic or lytic bone lesions. No intramuscular
lesions are evident.

Review of the MIP images confirms the above findings.
IMPRESSION: CT angiogram chest:

1. No thoracic aortic aneurysm or dissection. No appreciable
atherosclerotic plaque or ulceration noted in the aorta.

2.  No evident pulmonary embolus.

3. Areas of mild scarring and atelectasis. No edema or airspace
opacity. 2 mm nodular opacity in left upper lobe. No follow-up
needed if patient is low-risk. Non-contrast chest CT can be
considered in 12 months if patient is high-risk. This recommendation
follows the consensus statement: Guidelines for Management of
Incidental Pulmonary Nodules Detected on CT Images: From the

4.  No evident adenopathy.

CT angiogram abdomen; CT angiogram pelvis:

1. No evident aneurysm or dissection involving the aorta, major
mesenteric, and major pelvic arterial vessels. No evident
fibromuscular dysplasia. No appreciable atherosclerotic plaque noted
in the arterial vessels in the abdomen and pelvis.

2. No bowel wall thickening or bowel obstruction. No abscess in the
abdomen or pelvis. Appendix appears normal.

3.  Uterus absent.

4. No evident renal or ureteral calculus. No hydronephrosis. Urinary
bladder wall thickness within normal limits.

## 2023-02-21 ENCOUNTER — Other Ambulatory Visit: Payer: Self-pay | Admitting: Internal Medicine

## 2023-02-21 ENCOUNTER — Ambulatory Visit
Admission: RE | Admit: 2023-02-21 | Discharge: 2023-02-21 | Disposition: A | Discharge status: Home / Self Care | Payer: Federal, State, Local not specified - PPO | Source: Ambulatory Visit | Attending: Internal Medicine | Admitting: Internal Medicine

## 2023-02-21 DIAGNOSIS — Z1231 Encounter for screening mammogram for malignant neoplasm of breast: Secondary | ICD-10-CM

## 2023-02-25 DIAGNOSIS — G4733 Obstructive sleep apnea (adult) (pediatric): Secondary | ICD-10-CM | POA: Diagnosis not present

## 2023-03-18 ENCOUNTER — Other Ambulatory Visit: Payer: Federal, State, Local not specified - PPO

## 2023-03-18 ENCOUNTER — Other Ambulatory Visit: Payer: Self-pay

## 2023-03-18 ENCOUNTER — Ambulatory Visit: Payer: Federal, State, Local not specified - PPO | Admitting: Internal Medicine

## 2023-03-18 DIAGNOSIS — H31093 Other chorioretinal scars, bilateral: Secondary | ICD-10-CM | POA: Diagnosis not present

## 2023-03-18 DIAGNOSIS — R7303 Prediabetes: Secondary | ICD-10-CM

## 2023-03-18 DIAGNOSIS — H04123 Dry eye syndrome of bilateral lacrimal glands: Secondary | ICD-10-CM | POA: Diagnosis not present

## 2023-03-18 DIAGNOSIS — H40023 Open angle with borderline findings, high risk, bilateral: Secondary | ICD-10-CM | POA: Diagnosis not present

## 2023-03-18 DIAGNOSIS — E7800 Pure hypercholesterolemia, unspecified: Secondary | ICD-10-CM

## 2023-03-18 DIAGNOSIS — H2511 Age-related nuclear cataract, right eye: Secondary | ICD-10-CM | POA: Diagnosis not present

## 2023-03-18 DIAGNOSIS — H5213 Myopia, bilateral: Secondary | ICD-10-CM | POA: Diagnosis not present

## 2023-03-19 ENCOUNTER — Other Ambulatory Visit: Payer: Federal, State, Local not specified - PPO

## 2023-03-19 DIAGNOSIS — R7303 Prediabetes: Secondary | ICD-10-CM

## 2023-03-19 DIAGNOSIS — E7800 Pure hypercholesterolemia, unspecified: Secondary | ICD-10-CM

## 2023-03-20 LAB — CMP14+EGFR
ALT: 15 IU/L (ref 0–32)
AST: 17 IU/L (ref 0–40)
Albumin: 4.5 g/dL (ref 3.8–4.9)
Alkaline Phosphatase: 79 IU/L (ref 44–121)
BUN/Creatinine Ratio: 19 (ref 9–23)
BUN: 17 mg/dL (ref 6–24)
Bilirubin Total: 0.4 mg/dL (ref 0.0–1.2)
CO2: 25 mmol/L (ref 20–29)
Calcium: 9.4 mg/dL (ref 8.7–10.2)
Chloride: 101 mmol/L (ref 96–106)
Creatinine, Ser: 0.91 mg/dL (ref 0.57–1.00)
Globulin, Total: 3.1 g/dL (ref 1.5–4.5)
Glucose: 80 mg/dL (ref 70–99)
Potassium: 4.4 mmol/L (ref 3.5–5.2)
Sodium: 139 mmol/L (ref 134–144)
Total Protein: 7.6 g/dL (ref 6.0–8.5)
eGFR: 73 mL/min/{1.73_m2} (ref >59)

## 2023-03-20 LAB — LIPID PANEL
Chol/HDL Ratio: 2 ratio (ref 0.0–4.4)
Cholesterol, Total: 151 mg/dL (ref 100–199)
HDL: 75 mg/dL (ref >39)
LDL Chol Calc (NIH): 67 mg/dL (ref 0–99)
Triglycerides: 34 mg/dL (ref 0–149)
VLDL Cholesterol Cal: 9 mg/dL (ref 5–40)

## 2023-03-20 LAB — HEMOGLOBIN A1C
Est. average glucose Bld gHb Est-mCnc: 126 mg/dL
Hgb A1c MFr Bld: 6 % — ABNORMAL HIGH (ref 4.8–5.6)

## 2023-03-21 ENCOUNTER — Encounter: Payer: Self-pay | Admitting: Internal Medicine

## 2023-03-22 DIAGNOSIS — Z713 Dietary counseling and surveillance: Secondary | ICD-10-CM | POA: Diagnosis not present

## 2023-03-22 DIAGNOSIS — F330 Major depressive disorder, recurrent, mild: Secondary | ICD-10-CM | POA: Diagnosis not present

## 2023-03-28 DIAGNOSIS — G4733 Obstructive sleep apnea (adult) (pediatric): Secondary | ICD-10-CM | POA: Diagnosis not present

## 2023-04-24 DIAGNOSIS — F331 Major depressive disorder, recurrent, moderate: Secondary | ICD-10-CM | POA: Diagnosis not present

## 2023-04-24 DIAGNOSIS — F411 Generalized anxiety disorder: Secondary | ICD-10-CM | POA: Diagnosis not present

## 2023-04-28 DIAGNOSIS — G4733 Obstructive sleep apnea (adult) (pediatric): Secondary | ICD-10-CM | POA: Diagnosis not present

## 2023-05-22 DIAGNOSIS — F331 Major depressive disorder, recurrent, moderate: Secondary | ICD-10-CM | POA: Diagnosis not present

## 2023-05-22 DIAGNOSIS — F411 Generalized anxiety disorder: Secondary | ICD-10-CM | POA: Diagnosis not present

## 2023-05-25 NOTE — Progress Notes (Signed)
Seen by casting department

## 2023-05-28 DIAGNOSIS — G4733 Obstructive sleep apnea (adult) (pediatric): Secondary | ICD-10-CM | POA: Diagnosis not present

## 2023-06-13 DIAGNOSIS — R208 Other disturbances of skin sensation: Secondary | ICD-10-CM | POA: Diagnosis not present

## 2023-06-19 ENCOUNTER — Ambulatory Visit: Payer: Federal, State, Local not specified - PPO | Admitting: Family Medicine

## 2023-06-19 ENCOUNTER — Other Ambulatory Visit: Payer: Self-pay | Admitting: Family Medicine

## 2023-06-19 ENCOUNTER — Encounter: Payer: Self-pay | Admitting: Family Medicine

## 2023-06-19 VITALS — BP 118/78 | HR 86 | Temp 98.6°F | Ht 68.0 in | Wt 156.0 lb

## 2023-06-19 DIAGNOSIS — N644 Mastodynia: Secondary | ICD-10-CM

## 2023-06-19 DIAGNOSIS — R208 Other disturbances of skin sensation: Secondary | ICD-10-CM | POA: Diagnosis not present

## 2023-06-19 MED ORDER — GABAPENTIN 300 MG PO CAPS
300.0000 mg | ORAL_CAPSULE | Freq: Three times a day (TID) | ORAL | 0 refills | Status: DC
Start: 2023-06-19 — End: 2023-12-30

## 2023-06-19 NOTE — Progress Notes (Signed)
I,Jameka J Llittleton, CMA,acting as a Neurosurgeon for [employer], NP.,have documented all relevant documentation on the behalf of Ellender Hose, NP,as directed by  Ellender Hose, NP while in the presence of Ellender Hose, NP.  Subjective:  Patient ID: [name] , female    DOB: 1965/11/26 , 57 y.o.   MRN: [id number]  Chief Complaint  Patient presents with   breast concerns    HPI  Patient presents today for burning sensation under her left breast. She reports it started last week Monday.  She went to the dermatologist  at [facility] and she told she doesn't have a rash or blisters under the breast but it may be internal shingles and started her on Valtrex and Gabapentin 100mg  TID . She reports the pain is really bad she is unable to stand or sit upright, rates it 10/10. She reports she has not been able to go to work due to the pain. She reported the gabapentin she got did not  help with the pain so she is no longer taking it. Will increase Gabapentin to 300mg  TID.     Past Medical History:  Diagnosis Date   Allergy-induced asthma    no current med.   Complication of anesthesia    states is hard to get to sleep and hard to wake up   History of CVA (cerebrovascular accident) 2004   states no deficits   Innocent heart murmur    per pt.   Medial meniscus tear 08/2016   left knee   Seasonal allergies      Family History  Problem Relation Age of Onset   Hypertension Mother    Diabetes Mother    Hyperlipidemia Mother    Heart disease Mother    Pancreatic cancer Father    Diabetes Sister    Hypertension Sister    Hyperlipidemia Sister      Current Outpatient Medications:    albuterol (VENTOLIN HFA) 108 (90 Base) MCG/ACT inhaler, Inhale 2 puffs into the lungs every 6 (six) hours as needed for wheezing or shortness of breath., Disp: 8 g, Rfl: 2   amoxicillin (AMOXIL) 500 MG tablet, Take 4 tablets one hour prior to dental appt prn, Disp: 4 tablet, Rfl: 2   Calcium-Vitamin  D-Vitamin K [phone] MG-UNT-MCG TABS, Take 1 tablet by mouth daily., Disp: , Rfl:    cetirizine (ZYRTEC) 10 MG tablet, Take 10 mg by mouth daily., Disp: , Rfl:    clobetasol ointment (TEMOVATE) 0.05 %, Apply topically 3 (three) times a week., Disp: , Rfl:    gabapentin (NEURONTIN) 300 MG capsule, Take 1 capsule (300 mg total) by mouth 3 (three) times daily for 10 days., Disp: 30 capsule, Rfl: 0   montelukast (SINGULAIR) 10 MG tablet, Take 10 mg by mouth at bedtime., Disp: , Rfl:    rosuvastatin (CRESTOR) 10 MG tablet, TAKE 1 TABLET BY MOUTH EVERYDAY AT BEDTIME, Disp: 90 tablet, Rfl: 2   RYALTRIS 665-25 MCG/ACT SUSP, SMARTSIG:1 Spray(s) Both Nares 1-2 Times Daily, Disp: , Rfl:    SODIUM FLUORIDE, DENTAL RINSE, 0.2 % SOLN, Take by mouth as directed., Disp: , Rfl:    valACYclovir (VALTREX) 1000 MG tablet, Take 1,000 mg by mouth 2 (two) times daily., Disp: , Rfl:    Allergies  Allergen Reactions   Other     Commercial strength cleaners, floor strippers, etc Other reaction(s): Respiratory Distress Other reaction(s): Respiratory Distress Other reaction(s): Eye Redness    Tomato Anaphylaxis   Grass Pollen(K-O-R-T-Swt Vern)  Other reaction(s): Other     Review of Systems  Constitutional: Negative.   HENT: Negative.    Eyes: Negative.   Respiratory: Negative.    Cardiovascular: Negative.      Today's Vitals   06/19/23 0902  BP: 118/78  Pulse: 86  Temp: 98.6 F (37 C)  Weight: 156 lb (70.8 kg)  Height: 5\' 8"  (1.727 m)  PainSc: 10-Worst pain ever  PainLoc: Breast   Body mass index is 23.72 kg/m.  Wt Readings from Last 3 Encounters:  06/19/23 156 lb (70.8 kg)  12/11/22 162 lb 12.8 oz (73.8 kg)  12/10/22 163 lb 9.6 oz (74.2 kg)    The 10-year ASCVD risk score (Arnett DK, et al., 2019) is: 2.1%   Values used to calculate the score:     Age: 4 years     Sex: Female     Is Non-Hispanic African American: Yes     Diabetic: No     Tobacco smoker: No     Systolic Blood  Pressure: 118 mmHg     Is BP treated: No     HDL Cholesterol: 75 mg/dL     Total Cholesterol: 151 mg/dL  Objective:  Physical Exam HENT:     Head: Normocephalic.  Pulmonary:     Effort: Pulmonary effort is normal.     Breath sounds: Normal breath sounds.  Chest:  Breasts:    Tanner Score is 5.     Right: Normal. No tenderness.     Left: Tenderness present. No swelling, bleeding, inverted nipple, nipple discharge or skin change.  Skin:    General: Skin is warm and dry.  Neurological:     Mental Status: She is alert.  Psychiatric:        Mood and Affect: Affect is tearful.     Comments: Patient was crying and tearful during her visit         Assessment And Plan:  Burning sensation -     Gabapentin; Take 1 capsule (300 mg total) by mouth 3 (three) times daily for 10 days.  Dispense: 30 capsule; Refill: 0  Acute breast pain -     [place] LIMITED ULTRASOUND INCLUDING AXILLA LEFT BREAST ; Future    Return if symptoms worsen or fail to improve.  Patient was given opportunity to ask questions. Patient verbalized understanding of the plan and was able to repeat key elements of the plan. All questions were answered to their satisfaction.   I, Ellender Hose, NP, have reviewed all documentation for this visit. The documentation on 06/19/23 for the exam, diagnosis, procedures, and orders are all accurate and complete.     IF YOU HAVE BEEN REFERRED TO A SPECIALIST, IT MAY TAKE 1-2 WEEKS TO SCHEDULE/PROCESS THE REFERRAL. IF YOU HAVE NOT HEARD FROM US/SPECIALIST IN TWO WEEKS, PLEASE GIVE [place] A CALL AT [phone].

## 2023-06-20 DIAGNOSIS — G4733 Obstructive sleep apnea (adult) (pediatric): Secondary | ICD-10-CM | POA: Diagnosis not present

## 2023-06-20 DIAGNOSIS — G43819 Other migraine, intractable, without status migrainosus: Secondary | ICD-10-CM | POA: Diagnosis not present

## 2023-06-20 DIAGNOSIS — J301 Allergic rhinitis due to pollen: Secondary | ICD-10-CM | POA: Diagnosis not present

## 2023-06-21 ENCOUNTER — Ambulatory Visit
Admission: RE | Admit: 2023-06-21 | Discharge: 2023-06-21 | Disposition: A | Discharge status: Home / Self Care | Payer: Federal, State, Local not specified - PPO | Source: Ambulatory Visit | Attending: Family Medicine | Admitting: Family Medicine

## 2023-06-21 DIAGNOSIS — N644 Mastodynia: Secondary | ICD-10-CM

## 2023-06-26 DIAGNOSIS — F331 Major depressive disorder, recurrent, moderate: Secondary | ICD-10-CM | POA: Diagnosis not present

## 2023-06-26 DIAGNOSIS — F411 Generalized anxiety disorder: Secondary | ICD-10-CM | POA: Diagnosis not present

## 2023-06-27 ENCOUNTER — Other Ambulatory Visit: Payer: Self-pay | Admitting: Internal Medicine

## 2023-06-28 DIAGNOSIS — G4733 Obstructive sleep apnea (adult) (pediatric): Secondary | ICD-10-CM | POA: Diagnosis not present

## 2023-07-18 ENCOUNTER — Ambulatory Visit (HOSPITAL_COMMUNITY)
Admission: EM | Admit: 2023-07-18 | Discharge: 2023-07-18 | Disposition: A | Discharge status: Home / Self Care | Payer: Federal, State, Local not specified - PPO | Attending: Internal Medicine | Admitting: Internal Medicine

## 2023-07-18 ENCOUNTER — Telehealth: Payer: Federal, State, Local not specified - PPO | Admitting: Internal Medicine

## 2023-07-18 ENCOUNTER — Encounter (HOSPITAL_COMMUNITY): Payer: Self-pay | Admitting: Emergency Medicine

## 2023-07-18 ENCOUNTER — Ambulatory Visit (HOSPITAL_COMMUNITY): Payer: Federal, State, Local not specified - PPO

## 2023-07-18 DIAGNOSIS — J069 Acute upper respiratory infection, unspecified: Secondary | ICD-10-CM | POA: Diagnosis not present

## 2023-07-18 DIAGNOSIS — R0602 Shortness of breath: Secondary | ICD-10-CM | POA: Diagnosis not present

## 2023-07-18 DIAGNOSIS — R051 Acute cough: Secondary | ICD-10-CM

## 2023-07-18 DIAGNOSIS — R059 Cough, unspecified: Secondary | ICD-10-CM | POA: Diagnosis not present

## 2023-07-18 DIAGNOSIS — R0989 Other specified symptoms and signs involving the circulatory and respiratory systems: Secondary | ICD-10-CM | POA: Diagnosis not present

## 2023-07-18 MED ORDER — AZITHROMYCIN 250 MG PO TABS: ORAL_TABLET | ORAL | 0 refills | Status: AC

## 2023-07-18 MED ORDER — BENZONATATE 100 MG PO CAPS: 100.0000 mg | ORAL_CAPSULE | Freq: Three times a day (TID) | ORAL | 0 refills | Status: DC | PRN

## 2023-07-18 NOTE — Progress Notes (Deleted)
Virtual Visit via ***   This visit type was conducted due to national recommendations for restrictions regarding the COVID-19 Pandemic (e.g. social distancing) in an effort to limit this patient's exposure and mitigate transmission in our community.  Due to her co-morbid illnesses, this patient is at least at moderate risk for complications without adequate follow up.  This format is felt to be most appropriate for this patient at this time.  All issues noted in this document were discussed and addressed.  A limited physical exam was performed with this format.    This visit type was conducted due to national recommendations for restrictions regarding the COVID-19 Pandemic (e.g. social distancing) in an effort to limit this patient's exposure and mitigate transmission in our community.  Patients identity confirmed using two different identifiers.  This format is felt to be most appropriate for this patient at this time.  All issues noted in this document were discussed and addressed.  No physical exam was performed (except for noted visual exam findings with Video Visits).    Date:  07/18/2023   ID:  [name], DOB [birth year], MRN [id number]  Patient Location:  ***  Provider location:   Office    Chief Complaint:  ***  History of Present Illness:    [name] is a 57 y.o. female who presents via video conferencing for a telehealth visit today.  ***  The patient {does/does not:200015} have symptoms concerning for COVID-19 infection (fever, chills, cough, or new shortness of breath).   Patient presents today for congestion w drainage. Along with deep chest coughs that keep her up all night Sat/Sun. 10mg  Prednisone 2xday,  At home she has used OTC DM Cough medicine 2x day. She admits the frequency has reduced but still have cough, she is not sure if it's something worse. She asks if she should still get her covid vaccine on Friday as scheduled.     Past Medical  History:  Diagnosis Date   Allergy-induced asthma    no current med.   Complication of anesthesia    states is hard to get to sleep and hard to wake up   History of CVA (cerebrovascular accident) 2004   states no deficits   Innocent heart murmur    per pt.   Medial meniscus tear 08/2016   left knee   Seasonal allergies    Past Surgical History:  Procedure Laterality Date   ABDOMINAL HYSTERECTOMY     CARDIAC CATHETERIZATION N/A 11/28/2015   Procedure: Left Heart Cath and Coronary Angiography;  Surgeon: Yates Decamp, MD;  Location: [facility] INVASIVE CV LAB;  Service: Cardiovascular;  Laterality: N/A;   CYSTOSCOPY  09/29/2007   KNEE ARTHROSCOPY Right 05/2016   possibly 2019, Dr. Turner Daniels   KNEE ARTHROSCOPY Left 1994   KNEE ARTHROSCOPY Left 09/05/2016   Procedure: ARTHROSCOPY KNEE WITH PLICAECTOMY;  Surgeon: Gean Birchwood, MD;  Location: [facility];  Service: Orthopedics;  Laterality: Left;   KNEE ARTHROSCOPY Left 03/09/2022   w/ meniscal removal, Dr. Turner Daniels   LAPAROSCOPIC HYSTERECTOMY  09/29/2007   partial   TUBAL LIGATION     x 2     No outpatient medications have been marked as taking for the 07/18/23 encounter (Appointment) with [name], MD.     Allergies:   Other, Tomato, and Grass pollen(k-o-r-t-swt vern)   Social History   Tobacco Use   Smoking status: Never   Smokeless tobacco: Never  Vaping Use   Vaping status: Never  Used  Substance Use Topics   Alcohol use: Yes    Alcohol/week: 0.0 standard drinks of alcohol    Comment: occasionally   Drug use: No     Family Hx: The patient's family history includes Diabetes in her mother and sister; Heart disease in her mother; Hyperlipidemia in her mother and sister; Hypertension in her mother and sister; Pancreatic cancer in her father.  ROS:   Please see the history of present illness.    Review of Systems  Constitutional: Negative.   HENT: Negative.    Respiratory: Negative.    Genitourinary: Negative.    Neurological: Negative.   Endo/Heme/Allergies: Negative.   Psychiatric/Behavioral: Negative.      All other systems reviewed and are negative.   Labs/Other Tests and Data Reviewed:    Recent Labs: 09/17/2022: Hemoglobin 12.4; Platelets 496; TSH 1.290 03/19/2023: ALT 15; BUN 17; Creatinine, Ser 0.91; Potassium 4.4; Sodium 139   Recent Lipid Panel Lab Results  Component Value Date/Time   CHOL 151 03/19/2023 08:40 AM   TRIG 34 03/19/2023 08:40 AM   HDL 75 03/19/2023 08:40 AM   CHOLHDL 2.0 03/19/2023 08:40 AM   CHOLHDL 3.4 11/28/2015 12:00 PM   LDLCALC 67 03/19/2023 08:40 AM    Wt Readings from Last 3 Encounters:  06/19/23 156 lb (70.8 kg)  12/11/22 162 lb 12.8 oz (73.8 kg)  12/10/22 163 lb 9.6 oz (74.2 kg)     Exam:    Vital Signs:  There were no vitals taken for this visit.    Physical Exam  ASSESSMENT & PLAN:     There are no diagnoses linked to this encounter.   COVID-19 Education: The signs and symptoms of COVID-19 were discussed with the patient and how to seek care for testing (follow up with PCP or arrange E-visit).  The importance of social distancing was discussed today.  Patient Risk:   After full review of this patients clinical status, I feel that they are at least moderate risk at this time.  Time:   Today, I have spent *** minutes/ seconds with the patient with telehealth technology discussing above diagnoses.     Medication Adjustments/Labs and Tests Ordered: Current medicines are reviewed at length with the patient today.  Concerns regarding medicines are outlined above.   Tests Ordered: No orders of the defined types were placed in this encounter.   Medication Changes: No orders of the defined types were placed in this encounter.   Disposition:  Follow up {follow up:15908}  Signed, Coolidge Breeze, CMA

## 2023-07-18 NOTE — ED Provider Notes (Signed)
[facility]    CSN: [id number] Arrival date & time: 07/18/23  4098      History   Chief Complaint Chief Complaint  Patient presents with   Cough   Nasal Congestion    HPI [name] is a 57 y.o. female.   Patient presents with approximately 5-day history of nasal congestion and cough.  Reports that cough is not productive.  She has developed shortness of breath with exertion over the past few days.  Patient states that she typically works out routinely and is very active so has never experienced this type of shortness of breath.  She does have allergy induced asthma so has been using her albuterol inhaler with minimal improvement.  Reports a coworker had similar symptoms recently.  She denies any fevers.  She does have prednisone prescribed by asthma specialist to take as needed which she took for a few days with no improvement.   Cough   Past Medical History:  Diagnosis Date   Allergy-induced asthma    no current med.   Complication of anesthesia    states is hard to get to sleep and hard to wake up   History of CVA (cerebrovascular accident) 2004   states no deficits   Innocent heart murmur    per pt.   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   Right wrist pain 04/08/2018   Encntr for general adult medical exam w/o abnormal findings 05/21/2017   Menopausal syndrome 05/21/2017   Palpitations 05/21/2017   Weight increased 05/21/2017   Allergy to pollen 10/10/2016   Migraine  headache 10/10/2016   Nasal congestion 10/10/2016   Referred otalgia of both ears 10/10/2016   Chest pain 11/28/2015    Past Surgical History:  Procedure Laterality Date   ABDOMINAL HYSTERECTOMY     CARDIAC CATHETERIZATION N/A 11/28/2015   Procedure: Left Heart Cath and Coronary Angiography;  Surgeon: Yates Decamp, MD;  Location: [facility] INVASIVE CV LAB;  Service: Cardiovascular;  Laterality: N/A;   CYSTOSCOPY  09/29/2007   KNEE ARTHROSCOPY Right 05/2016   possibly 2019, Dr. Turner Daniels   KNEE ARTHROSCOPY Left 1994   KNEE ARTHROSCOPY Left 09/05/2016   Procedure: ARTHROSCOPY KNEE WITH PLICAECTOMY;  Surgeon: Gean Birchwood, MD;  Location: [facility];  Service: Orthopedics;  Laterality: Left;   KNEE ARTHROSCOPY Left 03/09/2022   w/ meniscal removal, Dr. Turner Daniels   LAPAROSCOPIC HYSTERECTOMY  09/29/2007   partial   TUBAL LIGATION     x 2    OB History   No obstetric history on file.      Home Medications    Prior to Admission medications   Medication Sig Start Date End Date Taking? Authorizing Provider  azithromycin (ZITHROMAX) 250 MG tablet Take 2 tablets (500 mg total) by mouth daily for 1 day, THEN 1 tablet (250 mg total) daily for 4 days. Take first 2 tablets together, then 1 every day until finished.. 07/18/23 07/23/23 Yes Pranavi Aure, Acie Fredrickson, FNP  benzonatate (TESSALON) 100 MG capsule Take  1 capsule (100 mg total) by mouth every 8 (eight) hours as needed for cough. 07/18/23  Yes Daaron Dimarco, Acie Fredrickson, FNP  albuterol (VENTOLIN HFA) 108 (90 Base) MCG/ACT inhaler Inhale 2 puffs into the lungs every 6 (six) hours as needed for wheezing or shortness of breath. 08/29/22   [name], MD  amoxicillin (AMOXIL) 500 MG tablet Take 4 tablets one hour prior to dental appt prn 01/29/22   [name], MD  Calcium-Vitamin D-Vitamin K [phone] MG-UNT-MCG TABS Take 1 tablet by mouth daily.    [provider]  cetirizine (ZYRTEC) 10 MG tablet Take 10 mg by mouth daily.    [provider]  clobetasol ointment (TEMOVATE) 0.05 % Apply topically 3 (three) times a week. 01/12/22   [provider]  gabapentin (NEURONTIN) 300 MG capsule Take 1 capsule (300 mg total) by mouth 3 (three) times daily for 10 days. 06/19/23 06/29/23  Ellender Hose, NP  montelukast (SINGULAIR) 10 MG tablet Take 10 mg by mouth at bedtime. 11/16/22   [provider]  rosuvastatin (CRESTOR) 10 MG tablet TAKE 1 TABLET BY MOUTH EVERYDAY AT BEDTIME 06/27/23   [name], MD  [facility] [phone] MCG/ACT SUSP SMARTSIG:1 Spray(s) Both Nares 1-2 Times Daily 01/23/22   [provider]  SODIUM FLUORIDE, DENTAL RINSE, 0.2 % SOLN Take by mouth as directed. 12/26/21   [provider]  valACYclovir (VALTREX) 1000 MG tablet Take 1,000 mg by mouth 2 (two) times daily. 06/13/23   [provider]    Family History Family History  Problem Relation Age of Onset   Hypertension Mother    Diabetes Mother    Hyperlipidemia Mother    Heart disease Mother    Pancreatic cancer Father    Diabetes Sister    Hypertension Sister    Hyperlipidemia Sister     Social History Social History   Tobacco Use   Smoking status: Never   Smokeless tobacco: Never  Vaping Use   Vaping status: Never Used  Substance Use Topics   Alcohol use: Yes    Alcohol/week: 0.0 standard drinks of alcohol    Comment: occasionally   Drug use: No     Allergies   Other, Tomato, and Grass pollen(k-o-r-t-swt vern)   Review of Systems Review of Systems Per HPI  Physical Exam Triage Vital Signs ED Triage Vitals  Encounter Vitals Group     BP 07/18/23 0914 (!) 150/86     Systolic BP Percentile --      Diastolic BP Percentile --      Pulse Rate 07/18/23 0914 92     Resp 07/18/23 0914 18     Temp 07/18/23 0914 98.1 F (36.7 C)     Temp src --      SpO2 07/18/23 0914 96 %     Weight --      Height --      Head Circumference --      Peak Flow --      Pain Score 07/18/23 0920 0     Pain Loc --       Pain Education --      Exclude from Growth Chart --    No data found.  Updated Vital Signs BP (!) 150/86 (BP Location: Left Arm)   Pulse 92   Temp 98.1 F (36.7 C)   Resp 18   SpO2 96%   Visual Acuity Right Eye Distance:   Left Eye Distance:   Bilateral Distance:    Right  Eye Near:   Left Eye Near:    Bilateral Near:     Physical Exam Constitutional:      General: She is not in acute distress.    Appearance: Normal appearance. She is not toxic-appearing or diaphoretic.  HENT:     Head: Normocephalic and atraumatic.     Right Ear: Tympanic membrane and ear canal normal.     Left Ear: Tympanic membrane and ear canal normal.     Nose: Congestion present.     Mouth/Throat:     Mouth: Mucous membranes are moist.     Pharynx: No posterior oropharyngeal erythema.  Eyes:     Extraocular Movements: Extraocular movements intact.     Conjunctiva/sclera: Conjunctivae normal.     Pupils: Pupils are equal, round, and reactive to light.  Cardiovascular:     Rate and Rhythm: Normal rate and regular rhythm.     Pulses: Normal pulses.     Heart sounds: Normal heart sounds.  Pulmonary:     Effort: Pulmonary effort is normal. No respiratory distress.     Breath sounds: Normal breath sounds. No stridor. No wheezing, rhonchi or rales.  Abdominal:     General: Abdomen is flat. Bowel sounds are normal.     Palpations: Abdomen is soft.  Musculoskeletal:        General: Normal range of motion.     Cervical back: Normal range of motion.  Skin:    General: Skin is warm and dry.  Neurological:     General: No focal deficit present.     Mental Status: She is alert and oriented to person, place, and time. Mental status is at baseline.  Psychiatric:        Mood and Affect: Mood normal.        Behavior: Behavior normal.      UC Treatments / Results  Labs (all labs ordered are listed, but only abnormal results are displayed) Labs Reviewed - No data to display  EKG   Radiology DG  Chest 2 View  Result Date: 07/18/2023 CLINICAL DATA:  Cough, congestion, and shortness of breath for 5 days. EXAM: CHEST - 2 VIEW COMPARISON:  Chest radiographs 11/01/2020 and 11/28/2015 FINDINGS: Cardiac silhouette is again at the upper limits of normal size. Mediastinal contours are within normal limits. The lungs are clear. No pleural effusion or pneumothorax. No acute skeletal abnormality. IMPRESSION: No active cardiopulmonary disease. Electronically Signed   By: Neita Garnet M.D.   On: 07/18/2023 12:59    Procedures Procedures (including critical care time)  Medications Ordered in UC Medications - No data to display  Initial Impression / Assessment and Plan / UC Course  I have reviewed the triage vital signs and the nursing notes.  Pertinent labs & imaging results that were available during my care of the patient were reviewed by me and considered in my medical decision making (see chart for details).     Chest x-ray completed that was negative for any acute cardiopulmonary process.  Suspect viral illness versus acute bronchitis.  Will treat with azithromycin to cover for atypicals and benzonatate to take as needed for cough.  Patient advised to continue albuterol inhaler as needed.  There are no adventitious lung sounds on exam, no tachypnea, and oxygen is normal so do not think emergent evaluation is necessary.  Advised patient of strict return precautions.  Patient verbalized understanding and was agreeable with plan. Final Clinical Impressions(s) / UC Diagnoses   Final diagnoses:  Acute upper  respiratory infection  Acute cough     Discharge Instructions      Chest x-ray is pending.  I have prescribed you an antibiotic and a cough medication for symptoms.  Continue albuterol inhaler as needed.  Follow-up if any symptoms persist or worsen.    ED Prescriptions     Medication Sig Dispense Auth. Provider   azithromycin (ZITHROMAX) 250 MG tablet Take 2 tablets (500 mg total)  by mouth daily for 1 day, THEN 1 tablet (250 mg total) daily for 4 days. Take first 2 tablets together, then 1 every day until finished.. 6 tablet [place], [place], [place]   benzonatate (TESSALON) 100 MG capsule Take 1 capsule (100 mg total) by mouth every 8 (eight) hours as needed for cough. 21 capsule [place], Acie Fredrickson, [place]      PDMP not reviewed this encounter.   Gustavus Bryant, [place] 07/18/23 1310

## 2023-07-18 NOTE — Discharge Instructions (Signed)
Chest x-ray is pending.  I have prescribed you an antibiotic and a cough medication for symptoms.  Continue albuterol inhaler as needed.  Follow-up if any symptoms persist or worsen.

## 2023-07-18 NOTE — ED Triage Notes (Addendum)
Pt c/o cough, congestion, SOB for 5 days. She took prednisone for a few days.   States she can't get rid of cough.  She recently had "internal singles"

## 2023-07-19 DIAGNOSIS — F331 Major depressive disorder, recurrent, moderate: Secondary | ICD-10-CM | POA: Diagnosis not present

## 2023-07-19 DIAGNOSIS — F411 Generalized anxiety disorder: Secondary | ICD-10-CM | POA: Diagnosis not present

## 2023-07-28 DIAGNOSIS — G4733 Obstructive sleep apnea (adult) (pediatric): Secondary | ICD-10-CM | POA: Diagnosis not present

## 2023-08-27 DIAGNOSIS — M130 Polyarthritis, unspecified: Secondary | ICD-10-CM | POA: Diagnosis not present

## 2023-08-27 DIAGNOSIS — E039 Hypothyroidism, unspecified: Secondary | ICD-10-CM | POA: Diagnosis not present

## 2023-08-27 DIAGNOSIS — E7800 Pure hypercholesterolemia, unspecified: Secondary | ICD-10-CM | POA: Diagnosis not present

## 2023-08-27 DIAGNOSIS — I10 Essential (primary) hypertension: Secondary | ICD-10-CM | POA: Diagnosis not present

## 2023-08-27 DIAGNOSIS — E8881 Metabolic syndrome: Secondary | ICD-10-CM | POA: Diagnosis not present

## 2023-08-27 DIAGNOSIS — I789 Disease of capillaries, unspecified: Secondary | ICD-10-CM | POA: Diagnosis not present

## 2023-08-28 DIAGNOSIS — G4733 Obstructive sleep apnea (adult) (pediatric): Secondary | ICD-10-CM | POA: Diagnosis not present

## 2023-09-07 DIAGNOSIS — F411 Generalized anxiety disorder: Secondary | ICD-10-CM | POA: Diagnosis not present

## 2023-09-07 DIAGNOSIS — F331 Major depressive disorder, recurrent, moderate: Secondary | ICD-10-CM | POA: Diagnosis not present

## 2023-09-19 DIAGNOSIS — H04123 Dry eye syndrome of bilateral lacrimal glands: Secondary | ICD-10-CM | POA: Diagnosis not present

## 2023-09-19 DIAGNOSIS — H40023 Open angle with borderline findings, high risk, bilateral: Secondary | ICD-10-CM | POA: Diagnosis not present

## 2023-09-26 DIAGNOSIS — I10 Essential (primary) hypertension: Secondary | ICD-10-CM | POA: Diagnosis not present

## 2023-09-26 DIAGNOSIS — E7800 Pure hypercholesterolemia, unspecified: Secondary | ICD-10-CM | POA: Diagnosis not present

## 2023-09-27 ENCOUNTER — Encounter (HOSPITAL_BASED_OUTPATIENT_CLINIC_OR_DEPARTMENT_OTHER): Payer: Self-pay | Admitting: Cardiovascular Disease

## 2023-09-30 ENCOUNTER — Encounter: Payer: Federal, State, Local not specified - PPO | Admitting: Internal Medicine

## 2023-10-31 DIAGNOSIS — G4733 Obstructive sleep apnea (adult) (pediatric): Secondary | ICD-10-CM | POA: Diagnosis not present

## 2023-11-05 DIAGNOSIS — Z01419 Encounter for gynecological examination (general) (routine) without abnormal findings: Secondary | ICD-10-CM | POA: Diagnosis not present

## 2023-11-13 DIAGNOSIS — F331 Major depressive disorder, recurrent, moderate: Secondary | ICD-10-CM | POA: Diagnosis not present

## 2023-11-15 DIAGNOSIS — Z713 Dietary counseling and surveillance: Secondary | ICD-10-CM | POA: Diagnosis not present

## 2023-11-26 DIAGNOSIS — G4733 Obstructive sleep apnea (adult) (pediatric): Secondary | ICD-10-CM | POA: Diagnosis not present

## 2023-11-26 DIAGNOSIS — J301 Allergic rhinitis due to pollen: Secondary | ICD-10-CM | POA: Diagnosis not present

## 2023-12-01 DIAGNOSIS — G4733 Obstructive sleep apnea (adult) (pediatric): Secondary | ICD-10-CM | POA: Diagnosis not present

## 2023-12-24 ENCOUNTER — Encounter: Payer: Self-pay | Admitting: Podiatry

## 2023-12-26 DIAGNOSIS — F411 Generalized anxiety disorder: Secondary | ICD-10-CM | POA: Diagnosis not present

## 2023-12-26 DIAGNOSIS — F331 Major depressive disorder, recurrent, moderate: Secondary | ICD-10-CM | POA: Diagnosis not present

## 2023-12-30 ENCOUNTER — Encounter (HOSPITAL_BASED_OUTPATIENT_CLINIC_OR_DEPARTMENT_OTHER): Payer: Self-pay | Admitting: Cardiovascular Disease

## 2023-12-30 ENCOUNTER — Ambulatory Visit (HOSPITAL_BASED_OUTPATIENT_CLINIC_OR_DEPARTMENT_OTHER): Payer: Federal, State, Local not specified - PPO | Admitting: Cardiovascular Disease

## 2023-12-30 VITALS — BP 134/84 | HR 68 | Ht 68.0 in | Wt 160.4 lb

## 2023-12-30 DIAGNOSIS — Z8673 Personal history of transient ischemic attack (TIA), and cerebral infarction without residual deficits: Secondary | ICD-10-CM | POA: Diagnosis not present

## 2023-12-30 DIAGNOSIS — G4733 Obstructive sleep apnea (adult) (pediatric): Secondary | ICD-10-CM | POA: Diagnosis not present

## 2023-12-30 HISTORY — DX: Obstructive sleep apnea (adult) (pediatric): G47.33

## 2023-12-30 NOTE — Patient Instructions (Signed)
 Medication Instructions:  ?Your physician recommends that you continue on your current medications as directed. Please refer to the Current Medication list given to you today.  ? ?Labwork: ?NONE ? ?Testing/Procedures: ?NONE ? ?Follow-Up: ?AS NEEDED  ? ?  ?

## 2023-12-30 NOTE — Progress Notes (Signed)
 Cardiology Office Note:  .   Date:  12/30/2023  ID:  [name], DOB 1965/09/18, MRN [id number] PCP: Kathryn Neighbors, MD  [facility] Providers Cardiologist:  Maudine Sos, MD    History of Present Illness: .    [name] is a 58 y.o. female pre-diabetes, hyperlipidemia, OSA on CPAP, prior TIA and hyperlipidemia here for follow-up.  She was initially seen 05/2017 for an evaluation of palpitations.  [name] saw Dr. Elnita Wood on 03/2017 and noted intermittent palpitations.  EKG was unremarkable at that time.  She was referred to cardiology for further evaluation.  She had a 30-day event monitor 05/2017 that revealed sinus rhythm and no arrhythmias.  She reported chest pain at which time sinus rhythm was noted.  [name] reports having a TIA in 2002.  She notes that her speech was impaired for months. She also lost the ability to write with her right hand. 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 Records are no longer available, but she was reported to care for at [name] in [place]. She notes no source of her symptoms with ever found on brain imaging.  She notes being told that her EKG is always abnormal.  She was also told that she had an innocent murmur.  She was seen in the [facility] ED 11/2015 after a panic attack.  She had a LHC that showed normal coronary arteries.    [name] reported that her sister has hypertrophic cardiomyopathy.  Her nephew had sudden cardiac death while playing basketball.  He had a defibrillator implanted.  She thinks that he has had subsequent genetic testing but is unsure.  [name] echo 12/2019 was negative for HCM.  She reestablished care 11/2018 for due to lower extremity edema.  She also reported atypical chest pain.  Coronary CTA 12/2022 a revealed calcium  score of 0 and normal coronary arteries.  Discussed the use of AI scribe software for clinical  note transcription with the patient, who gave verbal consent to proceed.  History of Present Illness [name] notes that her usual BP range is 110-112/60-70 mmHg. She typically checks her blood pressure at home two to three times a week. Her blood pressure tends to rise during episodes of allergies, stress, or pain.  She has been using a CPAP machine for obstructive sleep apnea since a sleep study indicated multiple episodes of apnea per hour. She feels more rested since starting CPAP therapy, although it took six to seven months to adjust. She uses the CPAP every night and has a travel version for convenience.  She experiences intermittent edema in her legs, particularly when traveling or standing for extended periods. She manages this by wearing compression stockings during such times. The swelling is not consistent and hasn't occurred lately.  She has no orthopnea or PND.  She engages in regular physical activity, including cardio and strength training four times a week, and hiking on weekends. She monitors her heart rate during exercise, maintaining it around 130 bpm, and feels great during physical activity. Her diet has improved, and she has lost weight through exercise and dietary changes, avoiding bad carbs and refined sugars. Despite these efforts, her A1c has been between 5.9 and 6.1 for the past six years. A recent test showed elevated inflammation markers, with a CRP level of 5.8 mg/L.  No consistent swelling in her legs or feet, noting it is intermittent. No issues with her heart,  as previous tests showed normal heart function and no plaque.    ROS:  As per HPI  Studies Reviewed: [name]   EKG Interpretation Date/Time:  Monday Dec 30 2023 08:07:42 EDT Ventricular Rate:  68 PR Interval:  162 QRS Duration:  80 QT Interval:  380 QTC Calculation: 404 R Axis:   90  Text Interpretation: Normal sinus rhythm Rightward axis T wave abnormality, consider inferior ischemia T wave  abnormality, consider anterior ischemia When compared with ECG of 01-Nov-2020 05:11, No significant change was found Confirmed by Maudine Sos ([id number]) on 12/30/2023 8:15:27 AM    Risk Assessment/Calculations:             Physical Exam:   VS:  BP 134/84   Pulse 68   Ht 5\' 8"  (1.727 m)   Wt 160 lb 6.4 oz (72.8 kg)   SpO2 (!) 88%   BMI 24.39 kg/m  , BMI Body mass index is 24.39 kg/m. GENERAL:  Well appearing HEENT: Pupils equal round and reactive, fundi not visualized, oral mucosa unremarkable NECK:  No jugular venous distention, waveform within normal limits, carotid upstroke brisk and symmetric, no bruits, no thyromegaly LUNGS:  Clear to auscultation bilaterally HEART:  RRR.  PMI not displaced or sustained,S1 and S2 within normal limits, no S3, no S4, no clicks, no rubs, no murmurs ABD:  Flat, positive bowel sounds normal in frequency in pitch, no bruits, no rebound, no guarding, no midline pulsatile mass, no hepatomegaly, no splenomegaly EXT:  2 plus pulses throughout, no edema, no cyanosis no clubbing SKIN:  No rashes no nodules NEURO:  Cranial nerves II through XII grossly intact, motor grossly intact throughout PSYCH:  Cognitively intact, oriented to person place and time   ASSESSMENT AND PLAN: .    Assessment & Plan # Elevated Blood Pressure Intermittent elevation, possibly stress-related. It is never elevated at home.  Home BP cuff has been validated.  - Monitor blood pressure at home.  Goal <130/80 - Consult primary care if elevation persists or symptoms like chest pain occur.  # Venous Insufficiency Intermittent leg edema managed with compression stockings. Likely venous, not cardiac. - Use compression stockings during travel or prolonged standing.  # Elevated C-Reactive Protein CRP at 5.8 mg/L suggests inflammation. Follow-up with primary care scheduled. - Reassess CRP levels with primary care physician.  # Obstructive Sleep Apnea Improved with CPAP therapy.  Patient reports feeling more rested and uses CPAP regularly. - Continue CPAP therapy.    Dispo: f/u as needed  Signed, Maudine Sos, MD

## 2024-01-07 ENCOUNTER — Ambulatory Visit: Admitting: Podiatry

## 2024-01-07 ENCOUNTER — Encounter: Payer: Self-pay | Admitting: Podiatry

## 2024-01-07 DIAGNOSIS — M778 Other enthesopathies, not elsewhere classified: Secondary | ICD-10-CM

## 2024-01-07 DIAGNOSIS — D2371 Other benign neoplasm of skin of right lower limb, including hip: Secondary | ICD-10-CM

## 2024-01-07 NOTE — Progress Notes (Signed)
 She presents today [place] from [place] on a 10-day trip with a callus subfifth metatarsal head of the right foot.  This is the area that we did surgery on back in 2022 to help alleviate her symptoms at that time.  She has done well until recently.  She is wondering if it might be the orthotics.  Objective: Vital signs are stable she is alert and oriented x 3 reactive hyperkeratotic tissue plantar aspect of fifth metatarsal head of the right foot does not demonstrate any open lesions or wounds no punctated lesions does not demonstrate bursitis.  Assessment it appears that is most likely shoe gear very straight narrow foot is being placed in a curved shoe resulting in rubbing beneath the fifth metatarsal head.  Plan: Debridement of benign skin lesion and discussed appropriate shoe gear.

## 2024-01-08 ENCOUNTER — Other Ambulatory Visit: Payer: Self-pay | Admitting: Family Medicine

## 2024-01-08 DIAGNOSIS — Z1231 Encounter for screening mammogram for malignant neoplasm of breast: Secondary | ICD-10-CM

## 2024-01-10 DIAGNOSIS — E7800 Pure hypercholesterolemia, unspecified: Secondary | ICD-10-CM | POA: Diagnosis not present

## 2024-01-10 DIAGNOSIS — E8881 Metabolic syndrome: Secondary | ICD-10-CM | POA: Diagnosis not present

## 2024-01-10 DIAGNOSIS — I10 Essential (primary) hypertension: Secondary | ICD-10-CM | POA: Diagnosis not present

## 2024-01-10 DIAGNOSIS — G4730 Sleep apnea, unspecified: Secondary | ICD-10-CM | POA: Diagnosis not present

## 2024-01-10 DIAGNOSIS — F341 Dysthymic disorder: Secondary | ICD-10-CM | POA: Diagnosis not present

## 2024-01-22 DIAGNOSIS — G4733 Obstructive sleep apnea (adult) (pediatric): Secondary | ICD-10-CM | POA: Diagnosis not present

## 2024-02-12 DIAGNOSIS — G43819 Other migraine, intractable, without status migrainosus: Secondary | ICD-10-CM | POA: Diagnosis not present

## 2024-02-12 DIAGNOSIS — J019 Acute sinusitis, unspecified: Secondary | ICD-10-CM | POA: Diagnosis not present

## 2024-02-20 DIAGNOSIS — J301 Allergic rhinitis due to pollen: Secondary | ICD-10-CM | POA: Diagnosis not present

## 2024-02-20 DIAGNOSIS — J019 Acute sinusitis, unspecified: Secondary | ICD-10-CM | POA: Diagnosis not present

## 2024-02-22 DIAGNOSIS — G4733 Obstructive sleep apnea (adult) (pediatric): Secondary | ICD-10-CM | POA: Diagnosis not present

## 2024-02-24 ENCOUNTER — Ambulatory Visit
Admission: RE | Admit: 2024-02-24 | Discharge: 2024-02-24 | Disposition: A | Discharge status: Home / Self Care | Source: Ambulatory Visit | Attending: Family Medicine | Admitting: Family Medicine

## 2024-02-24 DIAGNOSIS — Z1231 Encounter for screening mammogram for malignant neoplasm of breast: Secondary | ICD-10-CM

## 2024-03-06 DIAGNOSIS — J320 Chronic maxillary sinusitis: Secondary | ICD-10-CM | POA: Diagnosis not present

## 2024-03-06 DIAGNOSIS — J324 Chronic pansinusitis: Secondary | ICD-10-CM | POA: Diagnosis not present

## 2024-03-06 DIAGNOSIS — J3489 Other specified disorders of nose and nasal sinuses: Secondary | ICD-10-CM | POA: Diagnosis not present

## 2024-03-06 DIAGNOSIS — J301 Allergic rhinitis due to pollen: Secondary | ICD-10-CM | POA: Diagnosis not present

## 2024-03-11 DIAGNOSIS — F411 Generalized anxiety disorder: Secondary | ICD-10-CM | POA: Diagnosis not present

## 2024-03-24 DIAGNOSIS — J3489 Other specified disorders of nose and nasal sinuses: Secondary | ICD-10-CM | POA: Diagnosis not present

## 2024-03-24 DIAGNOSIS — J301 Allergic rhinitis due to pollen: Secondary | ICD-10-CM | POA: Diagnosis not present

## 2024-03-25 DIAGNOSIS — H31093 Other chorioretinal scars, bilateral: Secondary | ICD-10-CM | POA: Diagnosis not present

## 2024-03-25 DIAGNOSIS — H40023 Open angle with borderline findings, high risk, bilateral: Secondary | ICD-10-CM | POA: Diagnosis not present

## 2024-03-25 DIAGNOSIS — H524 Presbyopia: Secondary | ICD-10-CM | POA: Diagnosis not present

## 2024-03-25 DIAGNOSIS — H04123 Dry eye syndrome of bilateral lacrimal glands: Secondary | ICD-10-CM | POA: Diagnosis not present

## 2024-03-25 DIAGNOSIS — H2511 Age-related nuclear cataract, right eye: Secondary | ICD-10-CM | POA: Diagnosis not present

## 2024-04-15 DIAGNOSIS — F411 Generalized anxiety disorder: Secondary | ICD-10-CM | POA: Diagnosis not present

## 2024-04-20 DIAGNOSIS — G4733 Obstructive sleep apnea (adult) (pediatric): Secondary | ICD-10-CM | POA: Diagnosis not present

## 2024-04-24 DIAGNOSIS — J301 Allergic rhinitis due to pollen: Secondary | ICD-10-CM | POA: Diagnosis not present

## 2024-04-30 DIAGNOSIS — J301 Allergic rhinitis due to pollen: Secondary | ICD-10-CM | POA: Diagnosis not present

## 2024-05-04 DIAGNOSIS — J301 Allergic rhinitis due to pollen: Secondary | ICD-10-CM | POA: Diagnosis not present

## 2024-05-11 DIAGNOSIS — J301 Allergic rhinitis due to pollen: Secondary | ICD-10-CM | POA: Diagnosis not present

## 2024-05-12 DIAGNOSIS — J301 Allergic rhinitis due to pollen: Secondary | ICD-10-CM | POA: Diagnosis not present

## 2024-05-12 DIAGNOSIS — F411 Generalized anxiety disorder: Secondary | ICD-10-CM | POA: Diagnosis not present

## 2024-05-14 DIAGNOSIS — J301 Allergic rhinitis due to pollen: Secondary | ICD-10-CM | POA: Diagnosis not present

## 2024-05-14 DIAGNOSIS — I10 Essential (primary) hypertension: Secondary | ICD-10-CM | POA: Diagnosis not present

## 2024-05-18 DIAGNOSIS — J301 Allergic rhinitis due to pollen: Secondary | ICD-10-CM | POA: Diagnosis not present

## 2024-05-21 DIAGNOSIS — J301 Allergic rhinitis due to pollen: Secondary | ICD-10-CM | POA: Diagnosis not present

## 2024-05-25 DIAGNOSIS — J301 Allergic rhinitis due to pollen: Secondary | ICD-10-CM | POA: Diagnosis not present

## 2024-05-28 DIAGNOSIS — J301 Allergic rhinitis due to pollen: Secondary | ICD-10-CM | POA: Diagnosis not present

## 2024-06-04 DIAGNOSIS — J301 Allergic rhinitis due to pollen: Secondary | ICD-10-CM | POA: Diagnosis not present

## 2024-06-08 DIAGNOSIS — J301 Allergic rhinitis due to pollen: Secondary | ICD-10-CM | POA: Diagnosis not present

## 2024-06-11 DIAGNOSIS — J301 Allergic rhinitis due to pollen: Secondary | ICD-10-CM | POA: Diagnosis not present

## 2024-06-15 DIAGNOSIS — J301 Allergic rhinitis due to pollen: Secondary | ICD-10-CM | POA: Diagnosis not present

## 2024-06-17 DIAGNOSIS — J301 Allergic rhinitis due to pollen: Secondary | ICD-10-CM | POA: Diagnosis not present

## 2024-06-18 DIAGNOSIS — J301 Allergic rhinitis due to pollen: Secondary | ICD-10-CM | POA: Diagnosis not present

## 2024-06-22 DIAGNOSIS — J301 Allergic rhinitis due to pollen: Secondary | ICD-10-CM | POA: Diagnosis not present

## 2024-06-25 DIAGNOSIS — J301 Allergic rhinitis due to pollen: Secondary | ICD-10-CM | POA: Diagnosis not present

## 2024-06-29 DIAGNOSIS — J301 Allergic rhinitis due to pollen: Secondary | ICD-10-CM | POA: Diagnosis not present

## 2024-07-02 DIAGNOSIS — J301 Allergic rhinitis due to pollen: Secondary | ICD-10-CM | POA: Diagnosis not present

## 2024-07-06 DIAGNOSIS — J301 Allergic rhinitis due to pollen: Secondary | ICD-10-CM | POA: Diagnosis not present

## 2024-07-08 DIAGNOSIS — J301 Allergic rhinitis due to pollen: Secondary | ICD-10-CM | POA: Diagnosis not present

## 2024-07-09 DIAGNOSIS — J301 Allergic rhinitis due to pollen: Secondary | ICD-10-CM | POA: Diagnosis not present

## 2024-07-13 DIAGNOSIS — J301 Allergic rhinitis due to pollen: Secondary | ICD-10-CM | POA: Diagnosis not present

## 2024-07-20 DIAGNOSIS — J301 Allergic rhinitis due to pollen: Secondary | ICD-10-CM | POA: Diagnosis not present

## 2024-07-27 DIAGNOSIS — J301 Allergic rhinitis due to pollen: Secondary | ICD-10-CM | POA: Diagnosis not present

## 2024-07-27 DIAGNOSIS — G4733 Obstructive sleep apnea (adult) (pediatric): Secondary | ICD-10-CM | POA: Diagnosis not present

## 2024-07-30 DIAGNOSIS — J301 Allergic rhinitis due to pollen: Secondary | ICD-10-CM | POA: Diagnosis not present

## 2024-08-03 DIAGNOSIS — J301 Allergic rhinitis due to pollen: Secondary | ICD-10-CM | POA: Diagnosis not present

## 2024-08-05 DIAGNOSIS — J301 Allergic rhinitis due to pollen: Secondary | ICD-10-CM | POA: Diagnosis not present

## 2024-08-06 DIAGNOSIS — J301 Allergic rhinitis due to pollen: Secondary | ICD-10-CM | POA: Diagnosis not present

## 2024-08-10 DIAGNOSIS — J301 Allergic rhinitis due to pollen: Secondary | ICD-10-CM | POA: Diagnosis not present

## 2024-08-13 DIAGNOSIS — J301 Allergic rhinitis due to pollen: Secondary | ICD-10-CM | POA: Diagnosis not present

## 2024-09-15 ENCOUNTER — Ambulatory Visit: Admitting: Podiatry
